# Patient Record
Sex: Male | Born: 1940 | Race: White | Hispanic: No | Marital: Married | Smoking: Former smoker
Health system: Southern US, Community
[De-identification: ages and names within clinical notes are randomized; demographics above are authoritative.]

## PROBLEM LIST (undated history)

## (undated) DIAGNOSIS — I10 Essential (primary) hypertension: Secondary | ICD-10-CM

## (undated) DIAGNOSIS — D485 Neoplasm of uncertain behavior of skin: Secondary | ICD-10-CM

## (undated) DIAGNOSIS — C4490 Unspecified malignant neoplasm of skin, unspecified: Secondary | ICD-10-CM

## (undated) DIAGNOSIS — N400 Enlarged prostate without lower urinary tract symptoms: Secondary | ICD-10-CM

## (undated) DIAGNOSIS — I493 Ventricular premature depolarization: Secondary | ICD-10-CM

## (undated) DIAGNOSIS — G479 Sleep disorder, unspecified: Secondary | ICD-10-CM

## (undated) DIAGNOSIS — I4891 Unspecified atrial fibrillation: Secondary | ICD-10-CM

## (undated) DIAGNOSIS — H900 Conductive hearing loss, bilateral: Secondary | ICD-10-CM

## (undated) DIAGNOSIS — E119 Type 2 diabetes mellitus without complications: Secondary | ICD-10-CM

## (undated) DIAGNOSIS — I441 Atrioventricular block, second degree: Secondary | ICD-10-CM

## (undated) DIAGNOSIS — E785 Hyperlipidemia, unspecified: Secondary | ICD-10-CM

## (undated) HISTORY — PX: SKIN CANCER EXCISION: SHX779

## (undated) HISTORY — DX: Unspecified atrial fibrillation: I48.91

## (undated) HISTORY — DX: Sleep disorder, unspecified: G47.9

## (undated) HISTORY — PX: OTHER SURGICAL HISTORY: SHX169

## (undated) HISTORY — DX: Benign prostatic hyperplasia without lower urinary tract symptoms: N40.0

## (undated) HISTORY — DX: Atrioventricular block, second degree: I44.1

## (undated) HISTORY — DX: Hyperlipidemia, unspecified: E78.5

## (undated) HISTORY — DX: Ventricular premature depolarization: I49.3

## (undated) HISTORY — DX: Type 2 diabetes mellitus without complications: E11.9

## (undated) HISTORY — PX: PACEMAKER PLACEMENT: SHX43

## (undated) HISTORY — DX: Unspecified malignant neoplasm of skin, unspecified: C44.90

## (undated) HISTORY — DX: Neoplasm of uncertain behavior of skin: D48.5

## (undated) HISTORY — PX: HERNIA REPAIR: SHX51

## (undated) HISTORY — PX: HAND SURGERY: SHX662

## (undated) HISTORY — DX: Essential (primary) hypertension: I10

## (undated) HISTORY — DX: Conductive hearing loss, bilateral: H90.0

---

## 2003-08-07 ENCOUNTER — Encounter: Admission: RE | Admit: 2003-08-07 | Discharge: 2003-11-05 | Payer: Self-pay

## 2003-11-26 ENCOUNTER — Encounter: Admission: RE | Admit: 2003-11-26 | Discharge: 2004-02-24 | Payer: Self-pay

## 2004-03-18 ENCOUNTER — Ambulatory Visit: Payer: Self-pay | Admitting: Internal Medicine

## 2004-03-25 ENCOUNTER — Ambulatory Visit: Payer: Self-pay | Admitting: Internal Medicine

## 2005-09-25 ENCOUNTER — Ambulatory Visit: Payer: Self-pay | Admitting: Internal Medicine

## 2005-09-30 ENCOUNTER — Ambulatory Visit: Payer: Self-pay | Admitting: Internal Medicine

## 2005-10-07 ENCOUNTER — Ambulatory Visit: Payer: Self-pay | Admitting: Internal Medicine

## 2005-10-09 ENCOUNTER — Ambulatory Visit: Payer: Self-pay | Admitting: Family Medicine

## 2005-10-12 ENCOUNTER — Ambulatory Visit: Payer: Self-pay | Admitting: Internal Medicine

## 2005-10-15 ENCOUNTER — Ambulatory Visit: Payer: Self-pay | Admitting: Internal Medicine

## 2005-10-16 ENCOUNTER — Ambulatory Visit: Payer: Self-pay | Admitting: Cardiology

## 2005-10-29 ENCOUNTER — Encounter: Payer: Self-pay | Admitting: Cardiology

## 2005-10-29 ENCOUNTER — Ambulatory Visit: Payer: Self-pay

## 2005-11-05 ENCOUNTER — Ambulatory Visit: Payer: Self-pay | Admitting: Internal Medicine

## 2005-12-03 ENCOUNTER — Ambulatory Visit: Payer: Self-pay | Admitting: Internal Medicine

## 2005-12-31 ENCOUNTER — Ambulatory Visit: Payer: Self-pay | Admitting: Internal Medicine

## 2006-01-01 ENCOUNTER — Ambulatory Visit: Payer: Self-pay | Admitting: Internal Medicine

## 2006-01-05 ENCOUNTER — Ambulatory Visit: Payer: Self-pay | Admitting: Internal Medicine

## 2006-01-28 ENCOUNTER — Ambulatory Visit: Payer: Self-pay | Admitting: Internal Medicine

## 2006-02-11 ENCOUNTER — Ambulatory Visit: Payer: Self-pay | Admitting: Cardiology

## 2006-02-25 ENCOUNTER — Ambulatory Visit: Payer: Self-pay | Admitting: Internal Medicine

## 2006-03-11 ENCOUNTER — Ambulatory Visit: Payer: Self-pay | Admitting: Internal Medicine

## 2006-03-15 ENCOUNTER — Ambulatory Visit: Payer: Self-pay | Admitting: Internal Medicine

## 2006-03-29 ENCOUNTER — Ambulatory Visit: Payer: Self-pay | Admitting: Internal Medicine

## 2006-04-29 ENCOUNTER — Ambulatory Visit: Payer: Self-pay | Admitting: Internal Medicine

## 2006-05-03 ENCOUNTER — Ambulatory Visit: Payer: Self-pay | Admitting: Internal Medicine

## 2006-05-10 ENCOUNTER — Ambulatory Visit: Payer: Self-pay | Admitting: Internal Medicine

## 2006-05-17 DIAGNOSIS — G479 Sleep disorder, unspecified: Secondary | ICD-10-CM | POA: Insufficient documentation

## 2006-05-17 DIAGNOSIS — Z85828 Personal history of other malignant neoplasm of skin: Secondary | ICD-10-CM

## 2006-05-27 ENCOUNTER — Ambulatory Visit: Payer: Self-pay | Admitting: Internal Medicine

## 2006-06-24 ENCOUNTER — Ambulatory Visit: Payer: Self-pay | Admitting: Internal Medicine

## 2006-06-28 ENCOUNTER — Ambulatory Visit: Payer: Self-pay | Admitting: Internal Medicine

## 2006-07-05 ENCOUNTER — Ambulatory Visit: Payer: Self-pay | Admitting: Internal Medicine

## 2006-07-19 ENCOUNTER — Ambulatory Visit: Payer: Self-pay | Admitting: Internal Medicine

## 2006-07-29 ENCOUNTER — Ambulatory Visit: Payer: Self-pay | Admitting: Internal Medicine

## 2006-08-12 ENCOUNTER — Ambulatory Visit: Payer: Self-pay | Admitting: Internal Medicine

## 2006-09-09 ENCOUNTER — Ambulatory Visit: Payer: Self-pay | Admitting: Internal Medicine

## 2006-09-16 ENCOUNTER — Ambulatory Visit: Payer: Self-pay | Admitting: Internal Medicine

## 2006-09-16 ENCOUNTER — Ambulatory Visit: Payer: Self-pay | Admitting: Cardiology

## 2006-10-14 ENCOUNTER — Ambulatory Visit: Payer: Self-pay | Admitting: Internal Medicine

## 2006-10-14 LAB — CONVERTED CEMR LAB: INR: 2.8

## 2006-11-11 ENCOUNTER — Ambulatory Visit: Payer: Self-pay | Admitting: Internal Medicine

## 2006-11-11 LAB — CONVERTED CEMR LAB
INR: 1.8
Prothrombin Time: 16.7 s

## 2006-11-25 ENCOUNTER — Ambulatory Visit: Payer: Self-pay | Admitting: Internal Medicine

## 2006-11-25 LAB — CONVERTED CEMR LAB: INR: 2.2

## 2006-12-23 ENCOUNTER — Ambulatory Visit: Payer: Self-pay | Admitting: Internal Medicine

## 2006-12-23 LAB — CONVERTED CEMR LAB
INR: 2.8
Prothrombin Time: 20.4 s

## 2007-01-20 ENCOUNTER — Ambulatory Visit: Payer: Self-pay | Admitting: Internal Medicine

## 2007-01-20 LAB — CONVERTED CEMR LAB
INR: 2.3
Prothrombin Time: 18.4 s

## 2007-02-17 ENCOUNTER — Ambulatory Visit: Payer: Self-pay | Admitting: Internal Medicine

## 2007-02-17 LAB — CONVERTED CEMR LAB
INR: 2.9
Prothrombin Time: 20.6 s

## 2007-03-17 ENCOUNTER — Ambulatory Visit: Payer: Self-pay | Admitting: Internal Medicine

## 2007-03-17 DIAGNOSIS — E119 Type 2 diabetes mellitus without complications: Secondary | ICD-10-CM | POA: Insufficient documentation

## 2007-03-17 DIAGNOSIS — N1831 Chronic kidney disease, stage 3a: Secondary | ICD-10-CM | POA: Insufficient documentation

## 2007-03-17 DIAGNOSIS — E1365 Other specified diabetes mellitus with hyperglycemia: Secondary | ICD-10-CM

## 2007-03-17 DIAGNOSIS — E1169 Type 2 diabetes mellitus with other specified complication: Secondary | ICD-10-CM | POA: Insufficient documentation

## 2007-03-17 DIAGNOSIS — E1351 Other specified diabetes mellitus with diabetic peripheral angiopathy without gangrene: Secondary | ICD-10-CM

## 2007-03-17 DIAGNOSIS — E785 Hyperlipidemia, unspecified: Secondary | ICD-10-CM | POA: Insufficient documentation

## 2007-03-17 DIAGNOSIS — N400 Enlarged prostate without lower urinary tract symptoms: Secondary | ICD-10-CM | POA: Insufficient documentation

## 2007-03-17 LAB — CONVERTED CEMR LAB: Prothrombin Time: 20.6 s

## 2007-03-21 ENCOUNTER — Encounter (INDEPENDENT_AMBULATORY_CARE_PROVIDER_SITE_OTHER): Payer: Self-pay | Admitting: *Deleted

## 2007-03-22 LAB — CONVERTED CEMR LAB
ALT: 16 units/L (ref 0–53)
Alkaline Phosphatase: 47 units/L (ref 39–117)
Cholesterol: 194 mg/dL (ref 0–200)
Creatinine, Ser: 1.7 mg/dL — ABNORMAL HIGH (ref 0.4–1.5)
Potassium: 5.7 meq/L — ABNORMAL HIGH (ref 3.5–5.1)
Total Bilirubin: 0.9 mg/dL (ref 0.3–1.2)
Total CHOL/HDL Ratio: 4.4
Total Protein: 6.9 g/dL (ref 6.0–8.3)

## 2007-03-29 ENCOUNTER — Encounter (INDEPENDENT_AMBULATORY_CARE_PROVIDER_SITE_OTHER): Payer: Self-pay | Admitting: *Deleted

## 2007-03-29 ENCOUNTER — Ambulatory Visit: Payer: Self-pay | Admitting: Internal Medicine

## 2007-03-30 ENCOUNTER — Telehealth: Payer: Self-pay | Admitting: Internal Medicine

## 2007-03-31 ENCOUNTER — Encounter: Payer: Self-pay | Admitting: Internal Medicine

## 2007-04-12 ENCOUNTER — Encounter: Payer: Self-pay | Admitting: Internal Medicine

## 2007-04-14 ENCOUNTER — Ambulatory Visit: Payer: Self-pay | Admitting: Internal Medicine

## 2007-04-14 DIAGNOSIS — H902 Conductive hearing loss, unspecified: Secondary | ICD-10-CM | POA: Insufficient documentation

## 2007-04-14 LAB — CONVERTED CEMR LAB
INR: 2.4
Prothrombin Time: 19 s

## 2007-05-12 ENCOUNTER — Ambulatory Visit: Payer: Self-pay | Admitting: Internal Medicine

## 2007-05-31 ENCOUNTER — Ambulatory Visit: Payer: Self-pay | Admitting: Internal Medicine

## 2007-05-31 LAB — CONVERTED CEMR LAB
AST: 23 units/L (ref 0–37)
BUN: 24 mg/dL — ABNORMAL HIGH (ref 6–23)
LDL Cholesterol: 88 mg/dL (ref 0–99)
VLDL: 26 mg/dL (ref 0–40)

## 2007-06-07 ENCOUNTER — Ambulatory Visit: Payer: Self-pay | Admitting: Internal Medicine

## 2007-06-07 LAB — CONVERTED CEMR LAB: HDL goal, serum: 40 mg/dL

## 2007-06-16 ENCOUNTER — Encounter (INDEPENDENT_AMBULATORY_CARE_PROVIDER_SITE_OTHER): Payer: Self-pay | Admitting: *Deleted

## 2007-06-16 LAB — CONVERTED CEMR LAB
Creatinine,U: 129.5 mg/dL
Hgb A1c MFr Bld: 6.3 % — ABNORMAL HIGH (ref 4.6–6.0)
Microalb Creat Ratio: 10 mg/g (ref 0.0–30.0)

## 2007-07-07 ENCOUNTER — Ambulatory Visit: Payer: Self-pay | Admitting: Internal Medicine

## 2007-08-02 ENCOUNTER — Ambulatory Visit: Payer: Self-pay | Admitting: Internal Medicine

## 2007-08-04 ENCOUNTER — Ambulatory Visit: Payer: Self-pay | Admitting: Internal Medicine

## 2007-08-04 LAB — CONVERTED CEMR LAB: INR: 2.2

## 2007-08-08 ENCOUNTER — Telehealth (INDEPENDENT_AMBULATORY_CARE_PROVIDER_SITE_OTHER): Payer: Self-pay | Admitting: *Deleted

## 2007-08-31 ENCOUNTER — Telehealth (INDEPENDENT_AMBULATORY_CARE_PROVIDER_SITE_OTHER): Payer: Self-pay | Admitting: *Deleted

## 2007-09-01 ENCOUNTER — Ambulatory Visit: Payer: Self-pay | Admitting: Internal Medicine

## 2007-09-14 ENCOUNTER — Ambulatory Visit: Payer: Self-pay | Admitting: Internal Medicine

## 2007-09-14 LAB — CONVERTED CEMR LAB
INR: 5.3
Prothrombin Time: 27.9 s

## 2007-09-22 ENCOUNTER — Ambulatory Visit: Payer: Self-pay | Admitting: Internal Medicine

## 2007-09-22 LAB — CONVERTED CEMR LAB: INR: 2

## 2007-10-07 ENCOUNTER — Ambulatory Visit: Payer: Self-pay | Admitting: Cardiology

## 2007-10-13 ENCOUNTER — Ambulatory Visit: Payer: Self-pay | Admitting: Internal Medicine

## 2007-10-13 LAB — CONVERTED CEMR LAB

## 2007-10-27 ENCOUNTER — Ambulatory Visit: Payer: Self-pay | Admitting: Internal Medicine

## 2007-11-17 ENCOUNTER — Ambulatory Visit: Payer: Self-pay | Admitting: Internal Medicine

## 2007-11-17 DIAGNOSIS — I4891 Unspecified atrial fibrillation: Secondary | ICD-10-CM | POA: Insufficient documentation

## 2007-11-17 LAB — CONVERTED CEMR LAB: INR: 4.1

## 2007-11-20 ENCOUNTER — Encounter: Payer: Self-pay | Admitting: Internal Medicine

## 2007-11-20 DIAGNOSIS — R930 Abnormal findings on diagnostic imaging of skull and head, not elsewhere classified: Secondary | ICD-10-CM | POA: Insufficient documentation

## 2007-11-28 ENCOUNTER — Ambulatory Visit: Payer: Self-pay

## 2007-11-29 ENCOUNTER — Encounter: Payer: Self-pay | Admitting: Internal Medicine

## 2007-12-15 ENCOUNTER — Ambulatory Visit: Payer: Self-pay | Admitting: Internal Medicine

## 2007-12-22 ENCOUNTER — Ambulatory Visit: Payer: Self-pay | Admitting: Internal Medicine

## 2007-12-24 LAB — CONVERTED CEMR LAB
Cholesterol: 178 mg/dL (ref 0–200)
HDL: 51.4 mg/dL (ref >39.0)
Total CHOL/HDL Ratio: 3.5
Triglycerides: 187 mg/dL — ABNORMAL HIGH (ref 0–149)

## 2007-12-27 ENCOUNTER — Encounter (INDEPENDENT_AMBULATORY_CARE_PROVIDER_SITE_OTHER): Payer: Self-pay | Admitting: *Deleted

## 2007-12-29 ENCOUNTER — Ambulatory Visit: Payer: Self-pay | Admitting: Internal Medicine

## 2007-12-29 DIAGNOSIS — D485 Neoplasm of uncertain behavior of skin: Secondary | ICD-10-CM | POA: Insufficient documentation

## 2008-01-04 ENCOUNTER — Ambulatory Visit: Payer: Self-pay | Admitting: Cardiology

## 2008-01-16 ENCOUNTER — Ambulatory Visit: Payer: Self-pay | Admitting: Internal Medicine

## 2008-01-16 DIAGNOSIS — I495 Sick sinus syndrome: Secondary | ICD-10-CM | POA: Insufficient documentation

## 2008-01-16 DIAGNOSIS — E1159 Type 2 diabetes mellitus with other circulatory complications: Secondary | ICD-10-CM | POA: Insufficient documentation

## 2008-01-16 DIAGNOSIS — I10 Essential (primary) hypertension: Secondary | ICD-10-CM | POA: Insufficient documentation

## 2008-01-16 LAB — CONVERTED CEMR LAB: Free T4: 1.1 ng/dL (ref 0.6–1.6)

## 2008-01-17 ENCOUNTER — Encounter (INDEPENDENT_AMBULATORY_CARE_PROVIDER_SITE_OTHER): Payer: Self-pay | Admitting: *Deleted

## 2008-02-02 ENCOUNTER — Ambulatory Visit: Payer: Self-pay | Admitting: Internal Medicine

## 2008-02-16 ENCOUNTER — Ambulatory Visit: Payer: Self-pay | Admitting: Internal Medicine

## 2008-02-16 LAB — CONVERTED CEMR LAB: INR: 2.1

## 2008-03-14 ENCOUNTER — Encounter: Payer: Self-pay | Admitting: Internal Medicine

## 2008-03-15 ENCOUNTER — Ambulatory Visit: Payer: Self-pay | Admitting: Internal Medicine

## 2008-03-15 LAB — CONVERTED CEMR LAB: INR: 1.6

## 2008-03-30 ENCOUNTER — Telehealth (INDEPENDENT_AMBULATORY_CARE_PROVIDER_SITE_OTHER): Payer: Self-pay | Admitting: *Deleted

## 2008-04-12 ENCOUNTER — Ambulatory Visit: Payer: Self-pay | Admitting: Internal Medicine

## 2008-04-12 LAB — CONVERTED CEMR LAB: INR: 1.7

## 2008-05-03 ENCOUNTER — Ambulatory Visit: Payer: Self-pay | Admitting: Internal Medicine

## 2008-05-03 ENCOUNTER — Telehealth (INDEPENDENT_AMBULATORY_CARE_PROVIDER_SITE_OTHER): Payer: Self-pay | Admitting: *Deleted

## 2008-05-07 ENCOUNTER — Ambulatory Visit: Payer: Self-pay | Admitting: Internal Medicine

## 2008-05-14 ENCOUNTER — Ambulatory Visit: Payer: Self-pay | Admitting: Internal Medicine

## 2008-05-16 ENCOUNTER — Ambulatory Visit: Payer: Self-pay | Admitting: Internal Medicine

## 2008-05-16 LAB — CONVERTED CEMR LAB: INR: 2.1

## 2008-05-23 ENCOUNTER — Ambulatory Visit: Payer: Self-pay | Admitting: Internal Medicine

## 2008-05-29 ENCOUNTER — Ambulatory Visit: Payer: Self-pay | Admitting: Internal Medicine

## 2008-05-29 LAB — CONVERTED CEMR LAB
BUN: 22 mg/dL (ref 6–23)
Basophils Absolute: 0 10*3/uL (ref 0.0–0.1)
Basophils Relative: 0.3 % (ref 0.0–3.0)
Calcium: 9.6 mg/dL (ref 8.4–10.5)
Creatinine, Ser: 1.5 mg/dL (ref 0.4–1.5)
Eosinophils Absolute: 0.2 10*3/uL (ref 0.0–0.7)
Eosinophils Relative: 4.1 % (ref 0.0–5.0)
GFR calc non Af Amer: 50 mL/min
HCT: 39.8 % (ref 39.0–52.0)
Hemoglobin: 13.5 g/dL (ref 13.0–17.0)
MCHC: 34.1 g/dL (ref 30.0–36.0)
MCV: 87.7 fL (ref 78.0–100.0)
Monocytes Absolute: 0.5 10*3/uL (ref 0.1–1.0)
Neutro Abs: 2.7 10*3/uL (ref 1.4–7.7)
Neutrophils Relative %: 55.2 % (ref 43.0–77.0)
RBC: 4.53 M/uL (ref 4.22–5.81)
aPTT: 34.8 s — ABNORMAL HIGH (ref 21.7–29.8)

## 2008-06-08 ENCOUNTER — Ambulatory Visit: Payer: Self-pay | Admitting: Internal Medicine

## 2008-06-11 ENCOUNTER — Ambulatory Visit: Payer: Self-pay | Admitting: Internal Medicine

## 2008-06-12 ENCOUNTER — Ambulatory Visit (HOSPITAL_COMMUNITY): Admission: RE | Admit: 2008-06-12 | Discharge: 2008-06-13 | Payer: Self-pay | Admitting: Internal Medicine

## 2008-06-12 ENCOUNTER — Encounter: Payer: Self-pay | Admitting: Cardiology

## 2008-06-13 ENCOUNTER — Encounter: Payer: Self-pay | Admitting: Internal Medicine

## 2008-06-18 ENCOUNTER — Ambulatory Visit: Payer: Self-pay | Admitting: Internal Medicine

## 2008-06-25 LAB — CONVERTED CEMR LAB
Basophils Absolute: 0 10*3/uL (ref 0.0–0.1)
Basophils Relative: 0.3 % (ref 0.0–3.0)
CO2: 28 meq/L (ref 19–32)
Calcium: 9.6 mg/dL (ref 8.4–10.5)
Chloride: 101 meq/L (ref 96–112)
Glucose, Bld: 105 mg/dL — ABNORMAL HIGH (ref 70–99)
Hemoglobin: 13.8 g/dL (ref 13.0–17.0)
Hgb A1c MFr Bld: 6 % (ref 4.6–6.0)
INR: 2.6 — ABNORMAL HIGH (ref 0.8–1.0)
Lymphocytes Relative: 28.6 % (ref 12.0–46.0)
MCHC: 35 g/dL (ref 30.0–36.0)
Monocytes Relative: 8.5 % (ref 3.0–12.0)
Neutro Abs: 2.6 10*3/uL (ref 1.4–7.7)
Neutrophils Relative %: 58.8 % (ref 43.0–77.0)
Prothrombin Time: 26.7 s — ABNORMAL HIGH (ref 10.9–13.3)
RBC: 4.58 M/uL (ref 4.22–5.81)
RDW: 14.5 % (ref 11.5–14.6)
Sodium: 139 meq/L (ref 135–145)
Total CHOL/HDL Ratio: 3.3
aPTT: 38.3 s — ABNORMAL HIGH (ref 21.7–29.8)

## 2008-06-26 ENCOUNTER — Encounter (INDEPENDENT_AMBULATORY_CARE_PROVIDER_SITE_OTHER): Payer: Self-pay | Admitting: *Deleted

## 2008-06-28 ENCOUNTER — Ambulatory Visit: Payer: Self-pay | Admitting: Internal Medicine

## 2008-06-28 LAB — CONVERTED CEMR LAB: INR: 2.6

## 2008-07-12 ENCOUNTER — Ambulatory Visit: Payer: Self-pay

## 2008-07-12 ENCOUNTER — Telehealth (INDEPENDENT_AMBULATORY_CARE_PROVIDER_SITE_OTHER): Payer: Self-pay | Admitting: *Deleted

## 2008-07-18 DIAGNOSIS — Z9889 Other specified postprocedural states: Secondary | ICD-10-CM | POA: Insufficient documentation

## 2008-07-18 DIAGNOSIS — I441 Atrioventricular block, second degree: Secondary | ICD-10-CM | POA: Insufficient documentation

## 2008-07-19 ENCOUNTER — Encounter: Payer: Self-pay | Admitting: Internal Medicine

## 2008-07-19 ENCOUNTER — Ambulatory Visit: Payer: Self-pay | Admitting: Internal Medicine

## 2008-07-20 ENCOUNTER — Ambulatory Visit: Payer: Self-pay | Admitting: Internal Medicine

## 2008-07-20 DIAGNOSIS — IMO0002 Reserved for concepts with insufficient information to code with codable children: Secondary | ICD-10-CM

## 2008-07-20 LAB — CONVERTED CEMR LAB: INR: 1.9

## 2008-08-07 ENCOUNTER — Telehealth (INDEPENDENT_AMBULATORY_CARE_PROVIDER_SITE_OTHER): Payer: Self-pay | Admitting: *Deleted

## 2008-08-21 ENCOUNTER — Ambulatory Visit: Payer: Self-pay | Admitting: Internal Medicine

## 2008-08-21 LAB — CONVERTED CEMR LAB: INR: 2.1

## 2008-09-14 ENCOUNTER — Ambulatory Visit: Payer: Self-pay | Admitting: Internal Medicine

## 2008-09-14 DIAGNOSIS — I10 Essential (primary) hypertension: Secondary | ICD-10-CM | POA: Insufficient documentation

## 2008-09-18 ENCOUNTER — Ambulatory Visit: Payer: Self-pay | Admitting: Internal Medicine

## 2008-10-15 ENCOUNTER — Ambulatory Visit: Payer: Self-pay | Admitting: Internal Medicine

## 2008-10-30 ENCOUNTER — Ambulatory Visit: Payer: Self-pay | Admitting: Internal Medicine

## 2008-10-30 LAB — CONVERTED CEMR LAB
BUN: 15 mg/dL (ref 6–23)
Cholesterol: 188 mg/dL (ref 0–200)
Creatinine, Ser: 1.3 mg/dL (ref 0.4–1.5)
HDL: 45.7 mg/dL (ref >39.00)
Triglycerides: 217 mg/dL — ABNORMAL HIGH (ref 0.0–149.0)

## 2008-11-06 ENCOUNTER — Ambulatory Visit: Payer: Self-pay | Admitting: Internal Medicine

## 2008-11-08 ENCOUNTER — Telehealth: Payer: Self-pay | Admitting: Internal Medicine

## 2008-12-04 ENCOUNTER — Ambulatory Visit: Payer: Self-pay | Admitting: Internal Medicine

## 2008-12-04 LAB — CONVERTED CEMR LAB: INR: 2.4

## 2008-12-06 ENCOUNTER — Telehealth (INDEPENDENT_AMBULATORY_CARE_PROVIDER_SITE_OTHER): Payer: Self-pay | Admitting: *Deleted

## 2008-12-14 ENCOUNTER — Ambulatory Visit: Payer: Self-pay | Admitting: Internal Medicine

## 2009-01-01 ENCOUNTER — Ambulatory Visit: Payer: Self-pay | Admitting: Internal Medicine

## 2009-01-01 LAB — CONVERTED CEMR LAB: INR: 2.4

## 2009-01-28 ENCOUNTER — Ambulatory Visit: Payer: Self-pay | Admitting: Internal Medicine

## 2009-02-11 ENCOUNTER — Ambulatory Visit: Payer: Self-pay | Admitting: Internal Medicine

## 2009-02-11 LAB — CONVERTED CEMR LAB
Cholesterol: 173 mg/dL (ref 0–200)
Triglycerides: 227 mg/dL — ABNORMAL HIGH (ref 0.0–149.0)

## 2009-02-18 ENCOUNTER — Ambulatory Visit: Payer: Self-pay | Admitting: Internal Medicine

## 2009-03-18 ENCOUNTER — Ambulatory Visit: Payer: Self-pay | Admitting: Internal Medicine

## 2009-04-01 ENCOUNTER — Ambulatory Visit: Payer: Self-pay | Admitting: Internal Medicine

## 2009-04-01 LAB — CONVERTED CEMR LAB: INR: 2.5

## 2009-04-29 ENCOUNTER — Ambulatory Visit: Payer: Self-pay | Admitting: Internal Medicine

## 2009-04-29 LAB — CONVERTED CEMR LAB: INR: 2.6

## 2009-05-27 ENCOUNTER — Ambulatory Visit: Payer: Self-pay | Admitting: Internal Medicine

## 2009-05-27 DIAGNOSIS — K219 Gastro-esophageal reflux disease without esophagitis: Secondary | ICD-10-CM | POA: Insufficient documentation

## 2009-05-28 ENCOUNTER — Telehealth (INDEPENDENT_AMBULATORY_CARE_PROVIDER_SITE_OTHER): Payer: Self-pay | Admitting: *Deleted

## 2009-05-28 LAB — CONVERTED CEMR LAB
AST: 32 units/L (ref 0–37)
Albumin: 4.2 g/dL (ref 3.5–5.2)
Alkaline Phosphatase: 42 units/L (ref 39–117)
BUN: 15 mg/dL (ref 6–23)
Basophils Absolute: 0 10*3/uL (ref 0.0–0.1)
Basophils Relative: 0.7 % (ref 0.0–3.0)
Creatinine,U: 213.8 mg/dL
Direct LDL: 92.8 mg/dL
Eosinophils Relative: 3.8 % (ref 0.0–5.0)
Hemoglobin: 14.3 g/dL (ref 13.0–17.0)
INR: 3.6 — ABNORMAL HIGH (ref 0.8–1.0)
Lymphocytes Relative: 26.4 % (ref 12.0–46.0)
Monocytes Relative: 9.3 % (ref 3.0–12.0)
Neutro Abs: 2.8 10*3/uL (ref 1.4–7.7)
Potassium: 5.2 meq/L — ABNORMAL HIGH (ref 3.5–5.1)
RBC: 4.85 M/uL (ref 4.22–5.81)
Total Protein: 6.7 g/dL (ref 6.0–8.3)
VLDL: 60 mg/dL — ABNORMAL HIGH (ref 0.0–40.0)
WBC: 4.6 10*3/uL (ref 4.5–10.5)

## 2009-06-24 ENCOUNTER — Ambulatory Visit: Payer: Self-pay | Admitting: Internal Medicine

## 2009-06-28 ENCOUNTER — Ambulatory Visit: Payer: Self-pay | Admitting: Internal Medicine

## 2009-07-23 ENCOUNTER — Ambulatory Visit: Payer: Self-pay | Admitting: Internal Medicine

## 2009-07-23 LAB — CONVERTED CEMR LAB
INR: 1.8
POC INR: 1.8

## 2009-07-24 ENCOUNTER — Telehealth (INDEPENDENT_AMBULATORY_CARE_PROVIDER_SITE_OTHER): Payer: Self-pay | Admitting: *Deleted

## 2009-08-20 ENCOUNTER — Telehealth (INDEPENDENT_AMBULATORY_CARE_PROVIDER_SITE_OTHER): Payer: Self-pay | Admitting: *Deleted

## 2009-08-20 ENCOUNTER — Ambulatory Visit: Payer: Self-pay | Admitting: Internal Medicine

## 2009-09-09 ENCOUNTER — Ambulatory Visit: Payer: Self-pay | Admitting: Internal Medicine

## 2009-09-16 ENCOUNTER — Ambulatory Visit: Payer: Self-pay | Admitting: Internal Medicine

## 2009-09-16 LAB — CONVERTED CEMR LAB: Creatinine, Ser: 1.2 mg/dL (ref 0.4–1.5)

## 2009-10-14 ENCOUNTER — Ambulatory Visit: Payer: Self-pay | Admitting: Internal Medicine

## 2009-11-11 ENCOUNTER — Ambulatory Visit: Payer: Self-pay | Admitting: Internal Medicine

## 2009-11-11 LAB — CONVERTED CEMR LAB: POC INR: 2.4

## 2009-11-26 ENCOUNTER — Ambulatory Visit: Payer: Self-pay | Admitting: Internal Medicine

## 2009-11-26 DIAGNOSIS — J069 Acute upper respiratory infection, unspecified: Secondary | ICD-10-CM | POA: Insufficient documentation

## 2009-11-26 LAB — CONVERTED CEMR LAB: Rapid Strep: NEGATIVE

## 2009-12-02 ENCOUNTER — Ambulatory Visit: Payer: Self-pay | Admitting: Internal Medicine

## 2009-12-02 LAB — CONVERTED CEMR LAB: POC INR: 2.6

## 2010-01-06 ENCOUNTER — Ambulatory Visit: Payer: Self-pay | Admitting: Internal Medicine

## 2010-01-06 LAB — CONVERTED CEMR LAB: POC INR: 3.1

## 2010-01-08 ENCOUNTER — Ambulatory Visit: Payer: Self-pay | Admitting: Internal Medicine

## 2010-01-13 ENCOUNTER — Ambulatory Visit: Payer: Self-pay | Admitting: Internal Medicine

## 2010-01-13 DIAGNOSIS — F329 Major depressive disorder, single episode, unspecified: Secondary | ICD-10-CM

## 2010-01-14 ENCOUNTER — Encounter: Payer: Self-pay | Admitting: Internal Medicine

## 2010-02-03 ENCOUNTER — Ambulatory Visit: Payer: Self-pay | Admitting: Internal Medicine

## 2010-02-24 ENCOUNTER — Ambulatory Visit: Payer: Self-pay | Admitting: Internal Medicine

## 2010-02-27 ENCOUNTER — Ambulatory Visit: Payer: Self-pay | Admitting: Internal Medicine

## 2010-04-07 ENCOUNTER — Ambulatory Visit: Payer: Self-pay | Admitting: Internal Medicine

## 2010-04-07 LAB — CONVERTED CEMR LAB: INR: 1.8

## 2010-04-14 ENCOUNTER — Ambulatory Visit: Payer: Self-pay | Admitting: Internal Medicine

## 2010-04-14 LAB — CONVERTED CEMR LAB
Creatinine, Ser: 1.4 mg/dL (ref 0.4–1.5)
Microalb Creat Ratio: 2.6 mg/g (ref 0.0–30.0)

## 2010-05-05 ENCOUNTER — Ambulatory Visit
Admission: RE | Admit: 2010-05-05 | Discharge: 2010-05-05 | Payer: Self-pay | Source: Home / Self Care | Attending: Internal Medicine | Admitting: Internal Medicine

## 2010-05-05 LAB — CONVERTED CEMR LAB: INR: 2.1

## 2010-05-10 DIAGNOSIS — Z7901 Long term (current) use of anticoagulants: Secondary | ICD-10-CM

## 2010-05-10 DIAGNOSIS — I4891 Unspecified atrial fibrillation: Secondary | ICD-10-CM

## 2010-05-21 ENCOUNTER — Ambulatory Visit: Payer: Self-pay | Admitting: Internal Medicine

## 2010-05-21 ENCOUNTER — Other Ambulatory Visit: Payer: Self-pay | Admitting: *Deleted

## 2010-05-21 DIAGNOSIS — Z5181 Encounter for therapeutic drug level monitoring: Secondary | ICD-10-CM

## 2010-05-21 DIAGNOSIS — I4891 Unspecified atrial fibrillation: Secondary | ICD-10-CM

## 2010-05-21 DIAGNOSIS — Z7901 Long term (current) use of anticoagulants: Secondary | ICD-10-CM

## 2010-05-27 NOTE — Assessment & Plan Note (Signed)
Summary: 4 mth fu/ns/kdc   Vital Signs:  Patient profile:   70 year old male Weight:      225 pounds Pulse rate:   60 / minute Resp:     16 per minute BP sitting:   110 / 60  (left arm)  Vitals Entered By: Doristine Devoid (June 24, 2009 9:06 AM) CC: 4 month roa    Primary Care Provider:  Marga Melnick MD  CC:  4 month roa .  History of Present Illness: Labs reviewed & risks discussed; he is on no diet & is not exercising Pathophysiology of insulin resistance discussed.Marland KitchenHis L great toenail got black in 01/2009. Now nail is repairing itself , but he trimmed nail too closely. PT/INR is 1.9 on Coumadin 1mg  once daily except 2 mg M&F.  Allergies: 1)  ! * Altace 2)  ! Amoxicillin 3)  ! Tramadol Hcl (Tramadol Hcl)  Physical Exam  General:  in no acute distress; alert,appropriate and cooperative throughout examination Pulses:  R and L dorsalis pedis and posterior tibial pulses are full and equal bilaterally Extremities:  Double nail formation R great nail w/o signs of infection Neurologic:  alert & oriented X3 and sensation intact to light touch decreased R sole.   Skin:  Intact without suspicious lesions or rashes Psych:  Questioning role of meds & /or stress in Lipid abnormalities   Impression & Recommendations:  Problem # 1:  DIABETES MELLITUS, TYPE II, CONTROLLED (ICD-250.00) Control deteriorating His updated medication list for this problem includes:    Diovan 160 Mg Tabs (Valsartan) .Marland Kitchen... 1daily    Metformin Hcl 500 Mg Tabs (Metformin hcl) .Marland Kitchen... 1 in am with b'fast & 2 with eve meal  Problem # 2:  ENCOUNTER FOR LONG-TERM USE OF ANTICOAGULANTS (ICD-V58.61)  Orders: Protime (14782NF)  Complete Medication List: 1)  Fenofibrate Micronized 134 Mg Caps (Fenofibrate micronized) .Marland Kitchen.. 1 by mouth once daily 2)  Diovan 160 Mg Tabs (Valsartan) .Marland Kitchen.. 1daily 3)  Metformin Hcl 500 Mg Tabs (Metformin hcl) .Marland Kitchen.. 1 in am with b'fast & 2 with eve meal 4)  Warfarin Sodium 2 Mg Tabs  (Warfarin sodium) .... Take as directed 5)  Furosemide 40 Mg Tabs (Furosemide) .... Take 1 tablet by mouth as needed 6)  Zyrtec Allergy 10 Mg Tabs (Cetirizine hcl) .Marland Kitchen.. 1 by mouth once daily prn 7)  Pravastatin Sodium 20 Mg Tabs (Pravastatin sodium) .Marland Kitchen.. 1 qhs 8)  Cialis 20 Mg Tabs (Tadalafil) .... Take as directed 9)  Mvi: Your Life 40+  .Marland Kitchen.. 1 by mouth once daily 10)  Ranitidine Hcl 150 Mg Tabs (Ranitidine hcl) .Marland Kitchen.. 1 two times a day pre meals 11)  Tramadol Hcl 50 Mg Tabs (Tramadol hcl) .Marland Kitchen.. 1 q 6 hrs as needed joint pain  Patient Instructions: 1)  Consume LESS THAN 40 grams of sugar /day from foods & drinks with High Fructose Corn Syrup as #1,2 or #3 on label. 2)  Please schedule a follow-up appointment in 3 months. 3)  BUN,creat, prior to visit, ICD-9: 4)  HbgA1C prior to visit, ICD-9: Coumadin 2 mg M,W,F ; 1 mg all other days. PT/INR 1 month.Do trim nail closely; see Podiatrist as needed    ANTICOAGULATION RECORD PREVIOUS REGIMEN & LAB RESULTS Anticoagulation Diagnosis:  Atrial fibrillation on  07/20/2008 Previous INR Goal Range:  2.2 on  01/16/2008 Previous INR:  3.6 ratio on  05/27/2009 Previous Coumadin Dose(mg):  2mg  M/W/F, 1mg  all other days on  04/29/2009 Previous Regimen:  no change on  04/29/2009  Previous Coagulation Comments:  recheck 2 weeks on  10/13/2007  NEW REGIMEN & LAB RESULTS Anticoag. Dx: Atrial fibrillation Current INR: 1.9 Regimen: no change  (no change)

## 2010-05-27 NOTE — Letter (Signed)
Summary: London Sheer MD [facility]  London Sheer MD [facility]   Imported By: Lanelle Bal 02/05/2010 13:27:36  _____________________________________________________________________  External Attachment:    Type:   Image     Comment:   External Document

## 2010-05-27 NOTE — Assessment & Plan Note (Signed)
Summary: PT CHECK//PH  Nurse Visit   Vital Signs:  Patient profile:   70 year old male Weight:      215.2 pounds BMI:     30.99 Pulse rate:   70 / minute BP sitting:   124 / 68  (left arm) Cuff size:   large  Vitals Entered By: Shonna Chock CMA (February 03, 2010 10:20 AM) CC: PT check and Flu Vaccine   Allergies: 1)  ! * Altace 2)  ! Amoxicillin 3)  ! Tramadol Hcl (Tramadol Hcl) Laboratory Results   Blood Tests      INR: 2.1   (Normal Range: 0.88-1.12   Therap INR: 2.0-3.5)    Orders Added: 1)  Flu Vaccine 26yrs + MEDICARE PATIENTS [Q2039] 2)  Administration Flu vaccine - MCR [G0008] 3)  Est. Patient Level I [16109] 4)  Protime [60454UJ] Flu Vaccine Consent Questions     Do you have a history of severe allergic reactions to this vaccine? no    Any prior history of allergic reactions to egg and/or gelatin? no    Do you have a sensitivity to the preservative Thimersol? no    Do you have a past history of Guillan-Barre Syndrome? no    Do you currently have an acute febrile illness? no    Have you ever had a severe reaction to latex? no    Vaccine information given and explained to patient? yes    Are you currently pregnant? no    Lot Number:[id number]   Exp Date:10/25/2010   Site Given  Left Deltoid IMistration Flu vaccine - MCR [G0008] .lbmedflu    ANTICOAGULATION RECORD PREVIOUS REGIMEN & LAB RESULTS Anticoagulation Diagnosis:  Atrial fibrillation on  06/24/2009 Previous INR Goal Range:  2-3 on  11/11/2009 Previous INR:  1.9 on  09/09/2009 Previous Coumadin Dose(mg):  2MG  SUN,MON,WED,FRI, 1MG  ALL OTHER DAY on  09/09/2009 Previous Regimen:  3MG  TODAY THEN RESUME CURRENT REGIMEN on  09/09/2009 Previous Coagulation Comments:  recheck 2 weeks on  10/13/2007  NEW REGIMEN & LAB RESULTS Current INR: 2.1 Current Coumadin Dose(mg): 2mg  on M/W/F, 1mg  all other days  Regimen: Same  Provider: Hopper,William       Repeat testing in: 4 weeks MEDICATIONS FENOFIBRATE  MICRONIZED 134 MG CAPS (FENOFIBRATE MICRONIZED) 1 by mouth once daily DIOVAN 160 MG TABS (VALSARTAN) 1daily METFORMIN HCL 500 MG TABS (METFORMIN HCL) 2 bid with largest mealsl WARFARIN SODIUM 2 MG TABS (WARFARIN SODIUM) take as directed FUROSEMIDE 40 MG  TABS (FUROSEMIDE) take 1 tablet by mouth as needed ZYRTEC ALLERGY 10 MG  TABS (CETIRIZINE HCL) 1 by mouth once daily prn PRAVASTATIN SODIUM 20 MG  TABS (PRAVASTATIN SODIUM) 1 qhs CIALIS 20 MG  TABS (TADALAFIL) take as directed FLUOXETINE HCL 10 MG CAPS (FLUOXETINE HCL) 1 once daily   Anticoagulation Visit Questionnaire      Coumadin dose missed/changed:  No      Abnormal Bleeding Symptoms:  No   Any diet changes including alcohol intake, vegetables or greens since the last visit:  No Any illnesses or hospitalizations since the last visit:  No Any signs of clotting since the last visit (including chest discomfort, dizziness, shortness of breath, arm tingling, slurred speech, swelling or redness in leg):  No

## 2010-05-27 NOTE — Assessment & Plan Note (Signed)
Summary: 1 MO. F/U - JR  Nurse Visit   Vital Signs:  Patient profile:   70 year old male Weight:      227.6 pounds Pulse rate:   75 / minute Resp:     17 per minute BP sitting:   136 / 78  (left arm) Cuff size:   large  Vitals Entered By: Shonna Chock (May 27, 2009 9:08 AM)  Primary Care Provider:  Marga Melnick MD  CC:  1.) 1 Month follow-u  2.)  Discuss acid reflux-? what to take that will not interfer with Coumadin  3.) ? if labs schedule for 3 weeks from now can be scheduled done today or if its too early and Hypertension Management.  History of Present Illness: For 1 week he has SS burning with certain foods such as meat, chili beans.  Symptoms occur occa when supine. Aleve prn with golf. Drinking wine 1-2 hrs pre qhs.No exertional symptoms.No dysphagia.No PMH of ERD. He also wants DM assessment . FBS not checked.  Hypertension History:      He denies headache, chest pain, palpitations, dyspnea with exertion, orthopnea, PND, peripheral edema, visual symptoms, neurologic problems, syncope, and side effects from treatment.  He notes no problems with any antihypertensive medication side effects.        Positive major cardiovascular risk factors include male age 7 years old or older, diabetes, hyperlipidemia, and hypertension.  Negative major cardiovascular risk factors include negative family history for ischemic heart disease and non-tobacco-user status.        Further assessment for target organ damage reveals no history of ASHD, stroke/TIA, or peripheral vascular disease.      Physical Exam  General:  in no acute distress; alert,appropriate and cooperative throughout examination;overweight-appearing.   Eyes:  No corneal or conjunctival inflammation noted.No icterus Mouth:  Oral mucosa and oropharynx without lesions or exudates.  No pharyngeal erythema.   Lungs:  Normal respiratory effort, chest expands symmetrically. Lungs are clear to auscultation, no crackles or  wheezes. Heart:  normal rate and regular rhythm.  S4 with slurring   Abdomen:  Bowel sounds positive,abdomen soft and non-tender without masses, organomegaly . Protuberant with ventral hernia Pulses:  R and L carotid,radial,dorsalis pedis and posterior tibial pulses are full and equal bilaterally Extremities:  No clubbing, cyanosis, edema. Neurologic:  alert & oriented X3.   Skin:  Intact without suspicious lesions or rashes. No jaundice Cervical Nodes:  No lymphadenopathy noted Axillary Nodes:  No palpable lymphadenopathy Psych:  memory intact for recent and remote, normally interactive, and good eye contact.      Impression & Recommendations:  Problem # 1:  GERD (ICD-530.81)  His updated medication list for this problem includes:    Ranitidine Hcl 150 Mg Tabs (Ranitidine hcl) .Marland Kitchen... 1 two times a day pre meals  Orders: Venipuncture (29562) TLB-CBC Platelet - w/Differential (85025-CBCD) Prescription Created Electronically [phone])  Problem # 2:  ENCOUNTER FOR LONG-TERM USE OF ANTICOAGULANTS (ICD-V58.61)  Orders: Venipuncture (57846) TLB-PT (Protime) (85610-PTP)  Problem # 3:  ATRIAL FIBRILLATION (ICD-427.31)  PMH of  His updated medication list for this problem includes:    Warfarin Sodium 2 Mg Tabs (Warfarin sodium) .Marland Kitchen... Take as directed  Orders: TLB-TSH (Thyroid Stimulating Hormone) (84443-TSH) TLB-PT (Protime) (85610-PTP)  Problem # 4:  DIABETES MELLITUS, TYPE II, CONTROLLED (ICD-250.00)  His updated medication list for this problem includes:    Diovan 160 Mg Tabs (Valsartan) .Marland Kitchen... 1daily    Metformin Hcl 500 Mg Tabs (Metformin hcl) .Marland KitchenMarland KitchenMarland KitchenMarland Kitchen  1 by mouth two times a day  Orders: Venipuncture (24401) TLB-Hepatic/Liver Function Pnl (80076-HEPATIC) TLB-Lipid Panel (80061-LIPID) TLB-A1C / Hgb A1C (Glycohemoglobin) (83036-A1C) TLB-Microalbumin/Creat Ratio, Urine (82043-MALB)  Problem # 5:  ESSENTIAL HYPERTENSION, BENIGN (ICD-401.1)  controlled His updated medication  list for this problem includes:    Diovan 160 Mg Tabs (Valsartan) .Marland Kitchen... 1daily    Furosemide 40 Mg Tabs (Furosemide) .Marland Kitchen... Take 1 tablet by mouth as needed  Orders: Venipuncture (02725) TLB-Creatinine, Blood (82565-CREA) TLB-Potassium (K+) (84132-K) TLB-BUN (Urea Nitrogen) (84520-BUN) TLB-A1C / Hgb A1C (Glycohemoglobin) (83036-A1C)  Complete Medication List: 1)  Fenofibrate Micronized 134 Mg Caps (Fenofibrate micronized) .Marland Kitchen.. 1 by mouth once daily 2)  Diovan 160 Mg Tabs (Valsartan) .Marland Kitchen.. 1daily 3)  Metformin Hcl 500 Mg Tabs (Metformin hcl) .Marland Kitchen.. 1 by mouth two times a day 4)  Warfarin Sodium 2 Mg Tabs (Warfarin sodium) .... Take as directed 5)  Furosemide 40 Mg Tabs (Furosemide) .... Take 1 tablet by mouth as needed 6)  Zyrtec Allergy 10 Mg Tabs (Cetirizine hcl) .Marland Kitchen.. 1 by mouth once daily prn 7)  Pravastatin Sodium 20 Mg Tabs (Pravastatin sodium) .Marland Kitchen.. 1 qhs 8)  Cialis 20 Mg Tabs (Tadalafil) .... Take as directed 9)  Mvi: Your Life 40+  .Marland Kitchen.. 1 by mouth once daily 10)  Ranitidine Hcl 150 Mg Tabs (Ranitidine hcl) .Marland Kitchen.. 1 two times a day pre meals 11)  Tramadol Hcl 50 Mg Tabs (Tramadol hcl) .Marland Kitchen.. 1 q 6 hrs as needed joint pain  Hypertension Assessment/Plan:      The patient's hypertensive risk group is category C: Target organ damage and/or diabetes.  His calculated 10 year risk of coronary heart disease is 18 %.  Today's blood pressure is 136/78.     Patient Instructions: 1)  Avoid foods high in acid (tomatoes, citrus juices, spicy foods). Avoid eating within two hours of lying down or before exercising. Do not over eat; try smaller more frequent meals. Elevate head of bed twelve inches when sleeping.   Review of Systems General:  Denies weight loss. Eyes:  Denies blurring, double vision, and vision loss-both eyes. ENT:  Complains of hoarseness; denies difficulty swallowing. CV:  Denies lightheadness and near fainting. GI:  Complains of indigestion; denies abdominal pain, bloody stools,  dark tarry stools, gas, and loss of appetite. Derm:  Denies poor wound healing. Neuro:  Denies numbness and tingling. Endo:  Denies excessive hunger, excessive thirst, and excessive urination.   CC: 1.) 1 Month follow-u  2.)  Discuss acid reflux-? what to take that will not interfer with Coumadin  3.) ? if labs schedule for 3 weeks from now can be scheduled done today or if its too early, Hypertension Management Comments REVIEWED MED LIST, PATIENT AGREED DOSE AND INSTRUCTION CORRECT   ** Verified coumadin dose: 2mg  MWF and 1mg  all other days, no missed doses or change in diet.**   Allergies: 1)  ! * Altace 2)  ! Amoxicillin  Orders Added: 1)  Est. Patient Level IV [36644] 2)  Venipuncture [36415] 3)  TLB-CBC Platelet - w/Differential [85025-CBCD] 4)  TLB-Hepatic/Liver Function Pnl [80076-HEPATIC] 5)  TLB-Lipid Panel [80061-LIPID] 6)  TLB-Creatinine, Blood [82565-CREA] 7)  TLB-Potassium (K+) [84132-K] 8)  TLB-BUN (Urea Nitrogen) [84520-BUN] 9)  TLB-A1C / Hgb A1C (Glycohemoglobin) [83036-A1C] 10)  TLB-Microalbumin/Creat Ratio, Urine [82043-MALB] 11)  Prescription Created Electronically [G8553] 12)  TLB-TSH (Thyroid Stimulating Hormone) [84443-TSH] 13)  TLB-PT (Protime) [85610-PTP] Prescriptions: TRAMADOL HCL 50 MG TABS (TRAMADOL HCL) 1 q 6 hrs as needed joint pain  #  30 x 1   Entered and Authorized by:   Marga Melnick MD   Signed by:   Marga Melnick MD on 05/27/2009   Method used:   Faxed to ...       [facility] [phone]* (retail)       [address]       [place], [place]  [address]       Ph: [phone] or [phone]       Fax: [phone]   RxID:   [id number] RANITIDINE HCL 150 MG TABS (RANITIDINE HCL) 1 two times a day pre meals  #60 x 2   Entered and Authorized by:   Marga Melnick MD   Signed by:   Marga Melnick MD on 05/27/2009   Method used:   Faxed to ...       [facility] [address] (retail)       [address]       [place], [place]  [address]       Ph:  [phone] or [phone]       Fax: [phone]   RxID:   [id number]

## 2010-05-27 NOTE — Assessment & Plan Note (Signed)
Summary: PT Check - jr  Nurse Visit   Vital Signs:  Patient profile:   70 year old male Weight:      232.4 pounds Pulse rate:   82 / minute BP sitting:   134 / 86  (left arm) Cuff size:   large  Vitals Entered By: Shonna Chock (April 29, 2009 9:07 AM)  Impression & Recommendations:  Problem # 1:  ENCOUNTER FOR LONG-TERM USE OF ANTICOAGULANTS (ICD-V58.61)  Orders: Est. Patient Level I (19147) Protime (82956OZ)  Problem # 2:  BRADYCARDIA-TACHYCARDIA SYNDROME (ICD-427.81)  His updated medication list for this problem includes:    Warfarin Sodium 2 Mg Tabs (Warfarin sodium) .Marland Kitchen... Take as directed  Complete Medication List: 1)  Fenofibrate Micronized 134 Mg Caps (Fenofibrate micronized) .Marland Kitchen.. 1 by mouth once daily 2)  Diovan 160 Mg Tabs (Valsartan) .Marland Kitchen.. 1daily 3)  Metformin Hcl 500 Mg Tabs (Metformin hcl) .Marland Kitchen.. 1 by mouth two times a day 4)  Warfarin Sodium 2 Mg Tabs (Warfarin sodium) .... Take as directed 5)  Furosemide 40 Mg Tabs (Furosemide) .... Take 1 tablet by mouth as needed 6)  Zyrtec Allergy 10 Mg Tabs (Cetirizine hcl) .Marland Kitchen.. 1 by mouth once daily prn 7)  Pravastatin Sodium 20 Mg Tabs (Pravastatin sodium) .Marland Kitchen.. 1 qhs 8)  Cialis 20 Mg Tabs (Tadalafil) .... Take as directed 9)  Promethazine Vc/codeine 6.25-5-10 Mg/40ml Syrp (Phenyleph-promethazine-cod) .Marland Kitchen.. 1 tsp q 6 hrs as needed   Patient Instructions: 1)  See written instructions  CC: PT Check Comments REVIEWED MED LIST, PATIENT AGREED DOSE AND INSTRUCTION CORRECT    Allergies: 1)  ! * Altace 2)  ! Amoxicillin Laboratory Results   Blood Tests      INR: 2.6   (Normal Range: 0.88-1.12   Therap INR: 2.0-3.5)    Orders Added: 1)  Est. Patient Level I [30865] 2)  Protime [78469GE]   ANTICOAGULATION RECORD PREVIOUS REGIMEN & LAB RESULTS Anticoagulation Diagnosis:  Atrial fibrillation on  07/20/2008 Previous INR Goal Range:  2.2 on  01/16/2008 Previous INR:  2.5 on  04/01/2009 Previous Coumadin  Dose(mg):  2mg  M/W/F, 1mg  all other days on  04/01/2009 Previous Regimen:  3MG  TODAY THEN 1MG  DAILY EXCEPT 2MG  M/W/F on  01/28/2009 Previous Coagulation Comments:  recheck 2 weeks on  10/13/2007  NEW REGIMEN & LAB RESULTS Current INR: 2.6 Current Coumadin Dose(mg): 2mg  M/W/F, 1mg  all other days Regimen: no change  Provider: Hopper,William      Repeat testing in: 4 weeks MEDICATIONS FENOFIBRATE MICRONIZED 134 MG CAPS (FENOFIBRATE MICRONIZED) 1 by mouth once daily DIOVAN 160 MG TABS (VALSARTAN) 1daily METFORMIN HCL 500 MG TABS (METFORMIN HCL) 1 by mouth two times a day WARFARIN SODIUM 2 MG TABS (WARFARIN SODIUM) take as directed FUROSEMIDE 40 MG  TABS (FUROSEMIDE) take 1 tablet by mouth as needed ZYRTEC ALLERGY 10 MG  TABS (CETIRIZINE HCL) 1 by mouth once daily prn PRAVASTATIN SODIUM 20 MG  TABS (PRAVASTATIN SODIUM) 1 qhs CIALIS 20 MG  TABS (TADALAFIL) take as directed PROMETHAZINE VC/CODEINE 6.25-5-10 MG/5ML SYRP (PHENYLEPH-PROMETHAZINE-COD) 1 tsp q 6 hrs as needed   Anticoagulation Visit Questionnaire      Coumadin dose missed/changed:  No      Abnormal Bleeding Symptoms:  No   Any diet changes including alcohol intake, vegetables or greens since the last visit:  No Any illnesses or hospitalizations since the last visit:  No Any signs of clotting since the last visit (including chest discomfort, dizziness, shortness of breath, arm tingling, slurred speech,  swelling or redness in leg):  No

## 2010-05-27 NOTE — Assessment & Plan Note (Signed)
Summary: [facility]   Visit Type:  Follow-up Primary Provider:  Marga Melnick MD   History of Present Illness: The patient presents today for routine electrophysiology followup. He reports doing very well since last being seen in our clinic. The patient denies symptoms of palpitations, chest pain, shortness of breath, orthopnea, PND, lower extremity edema, dizziness, presyncope, syncope, or neurologic sequela. The patient is tolerating medications without difficulties and is otherwise without complaint today.   Current Medications (verified): 1)  Fenofibrate Micronized 134 Mg Caps (Fenofibrate Micronized) .Marland Kitchen.. 1 By Mouth Once Daily 2)  Diovan 160 Mg Tabs (Valsartan) .Marland Kitchen.. 1daily 3)  Metformin Hcl 500 Mg Tabs (Metformin Hcl) .Marland Kitchen.. 1 in Am With B'fast & 2 With Eve Meal 4)  Warfarin Sodium 2 Mg Tabs (Warfarin Sodium) .... Take As Directed 5)  Furosemide 40 Mg  Tabs (Furosemide) .... Take 1 Tablet By Mouth As Needed 6)  Zyrtec Allergy 10 Mg  Tabs (Cetirizine Hcl) .Marland Kitchen.. 1 By Mouth Once Daily Prn 7)  Pravastatin Sodium 20 Mg  Tabs (Pravastatin Sodium) .Marland Kitchen.. 1 Qhs 8)  Cialis 20 Mg  Tabs (Tadalafil) .... Take As Directed 9)  Mvi: Your Life 40+ .Marland Kitchen.. 1 By Mouth Once Daily  Allergies: 1)  ! * Altace 2)  ! Amoxicillin 3)  ! Tramadol Hcl (Tramadol Hcl)  Past History:  Past Medical History: Reviewed history from 12/14/2008 and no changes required. 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 denies tobacco, alcohol or drug use  Vital Signs:  Patient profile:   70 year old male Height:      70 inches Weight:      223 pounds BMI:     32.11 Pulse rate:   56 / minute BP sitting:   112 / 70  (left arm)  Vitals Entered By: Laurance Flatten CMA (June 28, 2009 10:06 AM)  Physical Exam  General:  obese, NAD Head:  normocephalic and atraumatic Eyes:  PERRLA/EOM intact; conjunctiva and lids normal. Mouth:  Teeth, gums and palate normal. Oral mucosa normal. Neck:  Neck supple, no JVD. No masses, thyromegaly or abnormal cervical nodes. Chest Wall:  pacemaker site is well healed Lungs:  Clear bilaterally to auscultation and percussion. Heart:  Non-displaced PMI, chest non-tender; regular rate and rhythm, S1, S2 without murmurs, rubs or gallops. Carotid upstroke normal, no bruit. Normal abdominal aortic size, no bruits. Femorals normal pulses, no bruits. Pedals normal pulses. No edema, no varicosities. Abdomen:  Bowel sounds positive; abdomen soft and non-tender without masses, organomegaly, or hernias noted. No hepatosplenomegaly. Msk:  Back normal, normal gait. Muscle strength and tone normal. Neurologic:  Alert and oriented x 3.   PPM Specifications Following MD:  Hillis Range, MD     PPM Vendor:  [employer]     PPM Model Number:  [id number]     PPM Serial Number:  [id number] PPM DOI:  06/12/2008     PPM Implanting MD:  Hillis Range, MD  Lead 1    Location: [facility]     DOI: 06/12/2008     Model #: [id number]      Serial #: [id number]     Status: active Lead 2    Location: [facility]     DOI: 06/12/2008     Model #: [id number]     Serial #: [id number]     Status: active  Magnet Response Rate:  BOL 98.6 ERI  86.3  Indications:  A-flutter Heart block   PPM Follow Up Remote Check?  No Battery Voltage:  2.80 V     Battery Est. Longevity:  >10 YEARS     Pacer Dependent:  No       PPM Device Measurements Atrium  Amplitude: 4.6 mV, Impedance: 449 ohms, Threshold: 0.5 V at 0.4 msec Right Ventricle  Amplitude: 12 mV, Impedance: 512 ohms, Threshold: 0.75 V at 0.4 msec  Episodes MS Episodes:  4964     Percent Mode Switch:  16%     Coumadin:  Yes Ventricular High Rate:  0     Atrial Pacing:  12%     Ventricular Pacing:  89%  Parameters Mode:  DDD     Lower Rate Limit:  60     Upper Rate Limit:  120 Paced AV Delay:  200     Sensed AV Delay:  180 Next Cardiology Appt Due:  12/26/2009 Tech Comments:  Normal device function.  EGM storage turned off today.  Battery longevity increased from 6 to >10 years.  No other chnages made today. Ventricular autocapture already on.  Pt know known afib.  V rates relatively well controlled. 2.7%PVC's.  V autocapture trends show occasional spikes, autocapture algorithm gets accurate threshold today.  ROV 6 months clinic. 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  He will contemplate this and contact my office if he wishes to change  His updated medication list for this problem includes:    Warfarin Sodium 2 Mg Tabs (Warfarin sodium) .Marland Kitchen... Take as directed  Patient Instructions: 1)  Your physician recommends that you schedule a follow-up appointment in: 6 months with Dr Johney Frame

## 2010-05-27 NOTE — Progress Notes (Signed)
Summary: Re-Instruct on Blood Thinner  Phone Note Outgoing Call Call back at [facility] Phone [phone]   Call placed by: Shonna Chock,  July 24, 2009 8:28 AM Call placed to: Patient Summary of Call: Please verify he was on 2 mg m,w,f & 1 mg (1/2 pill) all other days. Change to 2 mg m,w,f,& sun with 1 mg t,th,sat. PT 4 weeks   Spoke with patient, patient was taking 2mg  m,w,f & 1mg  (1/2pill) all other days. Patient was given the new instruction and ok'd .Fredric Mare Malloy  July 24, 2009 8:30 AM

## 2010-05-27 NOTE — Assessment & Plan Note (Signed)
Summary: BRONCHITIS?//PH   Vital Signs:  Patient profile:   70 year old male Weight:      211.2 pounds BMI:     30.41 O2 Sat:      98 % Temp:     97.7 degrees F oral Pulse rate:   72 / minute Resp:     17 per minute BP sitting:   116 / 68  (left arm) Cuff size:   large  Vitals Entered By: Shonna Chock CMA (February 24, 2010 4:45 PM) CC: Bronchitis, URI symptoms   Primary Care Provider:  Marga Melnick MD  CC:  Bronchitis and URI symptoms.  History of Present Illness:  RTI Symptoms      This is a 70 year old man who presents with  RTI symptoms since 10/15 . Onset was head congestion , rhinitis & ST.  The patient reports  recurrence of sore throat and dry cough, but denies  purulent nasal discharge, and earache.  Associated symptoms include some  wheezing ( no PMH of asthma).  The patient denies fever and dyspnea.  The patient denies headache.  The patient denies the following risk factors for Strep sinusitis: unilateral facial pain, tooth pain, and tender adenopathy.   Rx: Chloraseptic, Rx cough syrup, Robitussin DM  Current Medications (verified): 1)  Fenofibrate Micronized 134 Mg Caps (Fenofibrate Micronized) .Marland Kitchen.. 1 By Mouth Once Daily 2)  Diovan 160 Mg Tabs (Valsartan) .Marland Kitchen.. 1daily 3)  Metformin Hcl 500 Mg Tabs (Metformin Hcl) .... 2 Bid With Largest Mealsl 4)  Warfarin Sodium 2 Mg Tabs (Warfarin Sodium) .... Take As Directed 5)  Furosemide 40 Mg  Tabs (Furosemide) .... Take 1 Tablet By Mouth As Needed 6)  Zyrtec Allergy 10 Mg  Tabs (Cetirizine Hcl) .Marland Kitchen.. 1 By Mouth Once Daily Prn 7)  Pravastatin Sodium 20 Mg  Tabs (Pravastatin Sodium) .Marland Kitchen.. 1 Qhs 8)  Cialis 20 Mg  Tabs (Tadalafil) .... Take As Directed 9)  Fluoxetine Hcl 10 Mg Caps (Fluoxetine Hcl) .Marland Kitchen.. 1 Once Daily  Allergies: 1)  ! * Altace 2)  ! Amoxicillin 3)  ! Tramadol Hcl (Tramadol Hcl)  Physical Exam  General:  well-nourished,in no acute distress; alert,appropriate and cooperative throughout examination Ears:   External ear exam shows no significant lesions or deformities.  Otoscopic examination reveals clear canals, tympanic membranes are intact bilaterally without bulging, retraction, inflammation or discharge. Hearing is grossly normal bilaterally. Nose:  External nasal examination shows no deformity or inflammation. Nasal mucosa are pink and moist without lesions or exudates. Mouth:  Oral mucosa and oropharynx without lesions or exudates.   Uvular edema & erythema Lungs:  Normal respiratory effort, chest expands symmetrically. Lungs are clear to auscultation, no crackles or wheezes, but dry cough Heart:  normal rate, regular rhythm, no murmur, no gallop, and no rub.   Cervical Nodes:  No lymphadenopathy noted Axillary Nodes:  No palpable lymphadenopathy   Impression & Recommendations:  Problem # 1:  BRONCHITIS-ACUTE (ICD-466.0)  His updated medication list for this problem includes:    Doxycycline Hyclate 100 Mg Caps (Doxycycline hyclate) .Marland Kitchen... 1 two times a day x 2 days then 1 once daily ; avoid sun    Promethazine-codeine 6.25-10 Mg/50ml Syrp (Promethazine-codeine) .Marland Kitchen... 1 tsp every 6 hrs as needed  Problem # 2:  BRADYCARDIA-TACHYCARDIA SYNDROME (ICD-427.81)  His updated medication list for this problem includes:    Warfarin Sodium 2 Mg Tabs (Warfarin sodium) .Marland Kitchen... 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 1 by mouth once daily 2)  Diovan 160 Mg Tabs (Valsartan) .Marland Kitchen.. 1daily 3)  Metformin Hcl 500 Mg Tabs (Metformin hcl) .... 2 bid with largest mealsl 4)  Warfarin Sodium 2 Mg Tabs (Warfarin sodium) .... Take as directed 5)  Furosemide 40 Mg Tabs (Furosemide) .... Take 1 tablet by mouth as needed 6)  Zyrtec Allergy 10 Mg Tabs (Cetirizine hcl) .Marland Kitchen.. 1 by mouth once daily prn 7)  Pravastatin Sodium 20 Mg Tabs (Pravastatin sodium) .Marland Kitchen.. 1 qhs 8)  Cialis 20 Mg  Tabs (Tadalafil) .... Take as directed 9)  Fluoxetine Hcl 10 Mg Caps (Fluoxetine hcl) .Marland Kitchen.. 1 once daily 10)  Doxycycline Hyclate 100 Mg Caps (Doxycycline hyclate) .Marland Kitchen.. 1 two times a day x 2 days then 1 once daily ; avoid sun 11)  Promethazine-codeine 6.25-10 Mg/46ml Syrp (Promethazine-codeine) .Marland Kitchen.. 1 tsp every 6 hrs as needed  Patient Instructions: 1)  PT/INR this Thurs 11/03  because of antibiotics . 2)  Drink as much NON dairy  fluid as you can tolerate for the next few days. Prescriptions: PROMETHAZINE-CODEINE 6.25-10 MG/5ML SYRP (PROMETHAZINE-CODEINE) 1 tsp every 6 hrs as needed  #120cc x 0   Entered and Authorized by:   Marga Melnick MD   Signed by:   Marga Melnick MD on 02/24/2010   Method used:   Print then Give to Patient   RxID:   [id number] DOXYCYCLINE HYCLATE 100 MG CAPS (DOXYCYCLINE HYCLATE) 1 two times a day X 2 days then 1 once daily ; avoid sun  #12 x 0   Entered and Authorized by:   Marga Melnick MD   Signed by:   Marga Melnick MD on 02/24/2010   Method used:   Print then Give to Patient   RxID:   [id number]    Orders Added: 1)  Est. Patient Level III [84696]

## 2010-05-27 NOTE — Assessment & Plan Note (Signed)
Summary: PT CHECK///SPH  Nurse Visit   Vital Signs:  Patient profile:   70 year old male Height:      70 inches (177.80 cm) Weight:      211.25 pounds (96.02 kg) BMI:     30.42 O2 Sat:      97 % on Room air Temp:     98.0 degrees F (36.67 degrees C) oral Pulse rate:   60 / minute BP sitting:   104 / 56  (left arm) Cuff size:   large  Vitals Entered By: Lucious Groves CMA (February 27, 2010 10:17 AM)  O2 Flow:  Room air CC: PT check./kb   Allergies: 1)  ! * Altace 2)  ! Amoxicillin 3)  ! Tramadol Hcl (Tramadol Hcl) Laboratory Results   Blood Tests      INR: 2.2   (Normal Range: 0.88-1.12   Therap INR: 2.0-3.5)    Orders Added: 1)  Est. Patient Level I [22025] 2)  Protime [42706CB]   ANTICOAGULATION RECORD PREVIOUS REGIMEN & LAB RESULTS Anticoagulation Diagnosis:  Atrial fibrillation on  06/24/2009 Previous INR Goal Range:  2-3 on  11/11/2009 Previous INR:  2.1 on  02/03/2010 Previous Coumadin Dose(mg):  2mg  on M/W/F, 1mg  all other days  on  02/03/2010 Previous Regimen:  Same on  02/03/2010 Previous Coagulation Comments:  recheck 2 weeks on  10/13/2007  NEW REGIMEN & LAB RESULTS Anticoag. Dx: Atrial fibrillation Current INR: 2.2 Current Coumadin Dose(mg): 2mg  MWF and 1mg  all other days Regimen: Same  (no change)  Provider: Alwyn Ren Repeat testing in: 4 weeks Other Comments: Patient notes that he takes 2mg  on MWF and 1mg  all other days. No change will re-check in 4 weeks. Lucious Groves CMA  February 27, 2010 10:18 AM    Anticoagulation Visit Questionnaire Coumadin dose missed/changed:  No Abnormal Bleeding Symptoms:  No  Any diet changes including alcohol intake, vegetables or greens since the last visit:  No Any illnesses or hospitalizations since the last visit:  No Any signs of clotting since the last visit (including chest discomfort, dizziness, shortness of breath, arm tingling, slurred speech, swelling or redness in leg):  No  MEDICATIONS FENOFIBRATE  MICRONIZED 134 MG CAPS (FENOFIBRATE MICRONIZED) 1 by mouth once daily DIOVAN 160 MG TABS (VALSARTAN) 1daily METFORMIN HCL 500 MG TABS (METFORMIN HCL) 2 bid with largest mealsl WARFARIN SODIUM 2 MG TABS (WARFARIN SODIUM) take as directed FUROSEMIDE 40 MG  TABS (FUROSEMIDE) take 1 tablet by mouth as needed ZYRTEC ALLERGY 10 MG  TABS (CETIRIZINE HCL) 1 by mouth once daily prn PRAVASTATIN SODIUM 20 MG  TABS (PRAVASTATIN SODIUM) 1 qhs CIALIS 20 MG  TABS (TADALAFIL) take as directed FLUOXETINE HCL 10 MG CAPS (FLUOXETINE HCL) 1 once daily DOXYCYCLINE HYCLATE 100 MG CAPS (DOXYCYCLINE HYCLATE) 1 two times a day X 2 days then 1 once daily ; avoid sun PROMETHAZINE-CODEINE 6.25-10 MG/5ML SYRP (PROMETHAZINE-CODEINE) 1 tsp every 6 hrs as needed

## 2010-05-27 NOTE — Assessment & Plan Note (Signed)
Summary: rto 4 months.cbs   Vital Signs:  Patient profile:   70 year old male Weight:      219.4 pounds Temp:     97.8 degrees F oral Pulse rate:   72 / minute Resp:     17 per minute BP sitting:   124 / 70  (left arm) Cuff size:   large  Vitals Entered By: Shonna Chock CMA (January 13, 2010 9:39 AM) CC: 4 month follow-up and discuss labs (patient with copy), Type 2 diabetes mellitus follow-up   Primary Care Provider:  Marga Melnick MD  CC:  4 month follow-up and discuss labs (patient with copy) and Type 2 diabetes mellitus follow-up.  History of Present Illness: Type 2 Diabetes Mellitus Follow-Up      This is a Albert Hawkins who presents for Type 2 diabetes mellitus follow-up.  The patient denies polyuria, polydipsia, blurred vision, self managed hypoglycemia, significant  weight gain, and numbness of extremities.  The patient denies the following symptoms: neuropathic pain, chest pain ( but AF recurred), vomiting, orthostatic symptoms, poor wound healing, intermitte due to family concerns/ stresses  and not exercising regularly.  The patient has been measuring capillary blood glucose before breakfast  rarely ( 130-140)and  2 hrs after dinner( < 176).  Since the last visit, the patient reports having had no eye care, but he has appt 09/20.  A1c 7.2 % with sugar average 160 & risk 44%.  Current Medications (verified): 1)  Fenofibrate Micronized 134 Mg Caps (Fenofibrate Micronized) .Marland Kitchen.. 1 By Mouth Once Daily 2)  Diovan 160 Mg Tabs (Valsartan) .Marland Kitchen.. 1daily 3)  Metformin Hcl 500 Mg Tabs (Metformin Hcl) .Marland Kitchen.. 1 in Am With B'fast & 2 With Eve Meal 4)  Warfarin Sodium 2 Mg Tabs (Warfarin Sodium) .... Take As Directed 5)  Furosemide 40 Mg  Tabs (Furosemide) .... Take 1 Tablet By Mouth As Needed 6)  Zyrtec Allergy 10 Mg  Tabs (Cetirizine Hcl) .Marland Kitchen.. 1 By Mouth Once Daily Prn 7)  Pravastatin Sodium 20 Mg  Tabs (Pravastatin Sodium) .Marland Kitchen.. 1 Qhs 8)  Cialis 20 Mg  Tabs (Tadalafil) .... Take As  Directed  Allergies: 1)  ! * Altace 2)  ! Amoxicillin 3)  ! Tramadol Hcl (Tramadol Hcl)  Review of Systems Psych:  Complains of anxiety, depression, easily angered, easily tearful, and irritability; Difficulty sleeping. Caring for elderly mother . His daughter & grandchild had been living with them..  Physical Exam  General:  in no acute distress; alert,appropriate and cooperative throughout examination Lungs:  Normal respiratory effort, chest expands symmetrically. Lungs are clear to auscultation, no crackles or wheezes. Heart:  Normal rate and regular rhythm. S1 and S2 normal without gallop, murmur, click, rub or other extra sounds.( AF not present) Pulses:  R and L carotid,radial,dorsalis pedis and posterior tibial pulses are full and equal bilaterally Extremities:  No clubbing, cyanosis, edema. Pes planus. Mild toenail  deformities w/o active fungal changes. Neurologic:  alert & oriented X3 and sensation  to light touch decreased over dorsum L foot.   Skin:  Intact without suspicious lesions or rashes Psych:  memory intact for recent and remote, normally interactive, good eye contact, not anxious appearing, and not depressed appearing but see history.     Impression & Recommendations:  Problem # 1:  DIABETES MELLITUS, UNCONTROLLED (ICD-250.02)  His updated medication list for this problem includes:    Diovan 160 Mg Tabs (Valsartan) .Marland Kitchen... 1daily    Metformin Hcl 500 Mg Tabs (  Metformin hcl) .Marland Kitchen... 2 bid with largest mealsl  Problem # 2:  DEPRESSION (ICD-311)  meds declined; pathophysiology of Neurotransmitter Deficiency disussed   His updated medication list for this problem includes:    Fluoxetine Hcl 10 Mg Caps (Fluoxetine hcl) .Marland Kitchen... 1 once daily  Complete Medication List: 1)  Fenofibrate Micronized 134 Mg Caps (Fenofibrate micronized) .Marland Kitchen.. 1 by mouth once daily 2)  Diovan 160 Mg Tabs (Valsartan) .Marland Kitchen.. 1daily 3)  Metformin Hcl 500 Mg Tabs (Metformin hcl) .... 2 bid with  largest mealsl 4)  Warfarin Sodium 2 Mg Tabs (Warfarin sodium) .... Take as directed 5)  Furosemide 40 Mg Tabs (Furosemide) .... Take 1 tablet by mouth as needed 6)  Zyrtec Allergy 10 Mg Tabs (Cetirizine hcl) .Marland Kitchen.. 1 by mouth once daily prn 7)  Pravastatin Sodium 20 Mg Tabs (Pravastatin sodium) .Marland Kitchen.. 1 qhs 8)  Cialis 20 Mg Tabs (Tadalafil) .... Take as directed 9)  Fluoxetine Hcl 10 Mg Caps (Fluoxetine hcl) .Marland Kitchen.. 1 once daily  Patient Instructions: 1)  Consider a trial of Fluoxetine . 2)  Please schedule a follow-up appointment in 3 months. 3)  BUN,creat,K+ prior to visit, ICD-9:995.20 4)  HbgA1C prior to visit, ICD-9:250.02 5)  Urine Microalbumin prior to visit, ICD-9:250.02. Consume < 40 grams of sugar/day from High Fructose Corn Syrup. Prescriptions: FLUOXETINE HCL 10 MG CAPS (FLUOXETINE HCL) 1 once daily  #30 x 3   Entered and Authorized by:   Marga Melnick MD   Signed by:   Marga Melnick MD on 01/13/2010   Method used:   Print then Give to Patient   RxID:   [id number]

## 2010-05-27 NOTE — Assessment & Plan Note (Signed)
Summary: sorethroat/cough//kn   Vital Signs:  Patient profile:   70 year old male Weight:      221.2 pounds Temp:     97.8 degrees F oral Pulse rate:   80 / minute Resp:     16 per minute BP sitting:   112 / 70  (left arm) Cuff size:   large  Vitals Entered By: Shonna Chock CMA (November 26, 2009 1:46 PM) CC: Sorethroat and cough since last Friday, URI symptoms   Primary Care Provider:  Marga Melnick MD  CC:  Sorethroat and cough since last Friday and URI symptoms.  History of Present Illness:  URI Symptoms      This is a 69 year old man who presents with URI symptoms as of of 07/29 with ST which improved with Cloraseptic  spray.  The patient reports nasal congestion, sore throat recurrence , and dry cough, but denies purulent nasal discharge and earache.  The patient denies fever, dyspnea, wheezing, vomiting, and diarrhea.  The patient denies  rontal  headache and severe fatigue.  The patient denies the following risk factors for Strep sinusitis: unilateral facial pain.  FBS rarely checked; 2 hr post meal < 160. PT/INR checked  7/18  & was therapeutic @ 2.4.  Current Medications (verified): 1)  Fenofibrate Micronized 134 Mg Caps (Fenofibrate Micronized) .Marland Kitchen.. 1 By Mouth Once Daily 2)  Diovan 160 Mg Tabs (Valsartan) .Marland Kitchen.. 1daily 3)  Metformin Hcl 500 Mg Tabs (Metformin Hcl) .Marland Kitchen.. 1 in Am With B'fast & 2 With Eve Meal 4)  Warfarin Sodium 2 Mg Tabs (Warfarin Sodium) .... Take As Directed 5)  Furosemide 40 Mg  Tabs (Furosemide) .... Take 1 Tablet By Mouth As Needed 6)  Zyrtec Allergy 10 Mg  Tabs (Cetirizine Hcl) .Marland Kitchen.. 1 By Mouth Once Daily Prn 7)  Pravastatin Sodium 20 Mg  Tabs (Pravastatin Sodium) .Marland Kitchen.. 1 Qhs 8)  Cialis 20 Mg  Tabs (Tadalafil) .... Take As Directed 9)  Mvi: Your Life 40+ .Marland Kitchen.. 1 By Mouth Once Daily  Allergies: 1)  ! * Altace 2)  ! Amoxicillin 3)  ! Tramadol Hcl (Tramadol Hcl)  Physical Exam  General:  in no acute distress; alert,appropriate and cooperative  throughout examination Ears:  External ear exam shows no significant lesions or deformities.  Otoscopic examination reveals clear canals, tympanic membranes are intact bilaterally without bulging, retraction, inflammation or discharge. Hearing is grossly normal bilaterally. Some wax bilaterally Nose:  External nasal examination shows no deformity or inflammation. Nasal mucosa are  dry without lesions or exudates. Mouth:  Oral mucosa and oropharynx without lesions or exudates.  Teeth in good repair. Lungs:  Normal respiratory effort, chest expands symmetrically. Lungs are clear to auscultation, no crackles or wheezes.Slightly rattly cough Cervical Nodes:  No lymphadenopathy noted Axillary Nodes:  No palpable lymphadenopathy   Impression & Recommendations:  Problem # 1:  URI (ICD-465.9)  His updated medication list for this problem includes:    Zyrtec Allergy 10 Mg Tabs (Cetirizine hcl) .Marland Kitchen... 1 by mouth once daily prn    Promethazine Vc/codeine 6.25-5-10 Mg/54ml Syrp (Phenyleph-promethazine-cod) .Marland Kitchen... 1 tsp every 6 hrs  as needed cough  Problem # 2:  PHARYNGITIS-ACUTE (ICD-462)  His updated medication list for this problem includes:    Doxycycline Hyclate 100 Mg Caps (Doxycycline hyclate) .Marland Kitchen... 1 by mouth two times a day x 2 days , then once daily  Problem # 3:  DIABETES MELLITUS, TYPE II, CONTROLLED (ICD-250.00)  His updated medication list for this problem  includes:    Diovan 160 Mg Tabs (Valsartan) .Marland Kitchen... 1daily    Metformin Hcl 500 Mg Tabs (Metformin hcl) .Marland Kitchen... 1 in am with b'fast & 2 with eve meal  Complete Medication List: 1)  Fenofibrate Micronized 134 Mg Caps (Fenofibrate micronized) .Marland Kitchen.. 1 by mouth once daily 2)  Diovan 160 Mg Tabs (Valsartan) .Marland Kitchen.. 1daily 3)  Metformin Hcl 500 Mg Tabs (Metformin hcl) .Marland Kitchen.. 1 in am with b'fast & 2 with eve meal 4)  Warfarin Sodium 2 Mg Tabs (Warfarin sodium) .... Take as directed 5)  Furosemide 40 Mg Tabs (Furosemide) .... Take 1 tablet by mouth  as needed 6)  Zyrtec Allergy 10 Mg Tabs (Cetirizine hcl) .Marland Kitchen.. 1 by mouth once daily prn 7)  Pravastatin Sodium 20 Mg Tabs (Pravastatin sodium) .Marland Kitchen.. 1 qhs 8)  Cialis 20 Mg Tabs (Tadalafil) .... Take as directed 9)  Mvi: Your Life 40+  .Marland Kitchen.. 1 by mouth once daily 10)  Promethazine Vc/codeine 6.25-5-10 Mg/25ml Syrp (Phenyleph-promethazine-cod) .Marland Kitchen.. 1 tsp every 6 hrs  as needed cough 11)  Doxycycline Hyclate 100 Mg Caps (Doxycycline hyclate) .Marland Kitchen.. 1 by mouth two times a day x 2 days , then once daily  Other Orders: Rapid Strep ([address])  Patient Instructions: 1)  Drink as much fluid as you can tolerate for the next few days. Check PT/INR on Mon 12/02/2009.Check your blood sugars regularly. If your readings are usually above :150 or below 90 you should contact our office. Prescriptions: DOXYCYCLINE HYCLATE 100 MG CAPS (DOXYCYCLINE HYCLATE) 1 by mouth two times a day X 2 days , then once daily  #12 x 0   Entered and Authorized by:   Marga Melnick MD   Signed by:   Marga Melnick MD on 11/26/2009   Method used:   Printed then faxed to ...       [facility] [phone]* (retail)       [address]       [place], [place]  [address]       Ph: [phone] or [phone]       Fax: [phone]   RxID:   [id number] PROMETHAZINE VC/CODEINE 6.25-5-10 MG/5ML SYRP (PHENYLEPH-PROMETHAZINE-COD) 1 tsp every 6 hrs  as needed cough  #120cc x 0   Entered and Authorized by:   Marga Melnick MD   Signed by:   Marga Melnick MD on 11/26/2009   Method used:   Printed then faxed to ...       [facility] [address] (retail)       [address]       [place], [place]  [address]       Ph: [phone] or [phone]       Fax: [phone]   RxID:   [id number]   Laboratory Results    Other Tests  Rapid Strep: negative

## 2010-05-27 NOTE — Progress Notes (Signed)
Summary: Lab Results  Phone Note Outgoing Call   Call placed by: Shonna Chock,  May 28, 2009 9:49 AM Call placed to: Patient Summary of Call: Spoke with patient's wife, Information ok'd  Good ; no anemia . Platelet count minimally decreased, not significant.Liver function tests are  normal. Diabetes poorly controlled. Read ALL dood & drink labels . Aoid ALL  foods & drinks with High Fructose Corn Syrup as #1,2 or #3 on label. HFCS causes Triglyceride elevations (your TG are TWIVE normal @ 300) & cause uncontrolled Diabetes. Recheck fasting lipids & A1c after 10 weeks of avoiding HFCS.  Decrease Coumadin by 1/2 pill every Weds ; recheck PT/INR in 4 weeks . Hopp  Labs to be [facility]  May 28, 2009 9:51 AM

## 2010-05-27 NOTE — Cardiovascular Report (Signed)
Summary: Office Visit   Office Visit   Imported By: Roderic Ovens 01/14/2010 14:29:28  _____________________________________________________________________  External Attachment:    Type:   Image     Comment:   External Document

## 2010-05-27 NOTE — Progress Notes (Signed)
Summary: refill request  Phone Note Refill Request Message from:  Patient  Refills Requested: Medication #1:  METFORMIN HCL 500 MG TABS 1 in am with b'fast & 2 with eve meal [facility]   Method Requested: Fax to [facility] Initial call taken by: Shonna Chock,  August 20, 2009 9:27 AM    Prescriptions: METFORMIN HCL 500 MG TABS (METFORMIN HCL) 1 in am with b'fast & 2 with eve meal  #270 x 1   Entered by:   Shonna Chock   Authorized by:   Marga Melnick MD   Signed by:   Shonna Chock on 08/20/2009   Method used:   Faxed to ...       [facility]* (mail-order)             ,          Ph: [phone]       Fax: [phone]   RxID:   [id number]

## 2010-05-27 NOTE — Assessment & Plan Note (Signed)
Summary: 3 mth fu/kdc   Vital Signs:  Patient profile:   70 year old male Weight:      221.0 pounds Pulse rate:   57 / minute Resp:     16 per minute BP sitting:   106 / 68  (left arm) Cuff size:   large  Vitals Entered By: Shonna Chock (Sep 16, 2009 9:31 AM) CC: 3 month follow-up: copy of labs given Comments REVIEWED MED LIST, PATIENT AGREED DOSE AND INSTRUCTION CORRECT    Primary Care Provider:  Marga Melnick MD  CC:  3 month follow-up: copy of labs given.  History of Present Illness: FBS 120-216; 2 hrs post meal < 230. No hypoglycemia; weight down 8-10# in 6 months.No CVE ; no diet.  Allergies: 1)  ! * Altace 2)  ! Amoxicillin 3)  ! Tramadol Hcl (Tramadol Hcl)  Review of Systems General:  Complains of fatigue and sleep disorder; Joints affect sleep; Arthritis Strength Tylenol helps. Eyes:  Denies blurring, double vision, and vision loss-both eyes. CV:  Denies chest pain or discomfort, leg cramps with exertion, lightheadness, near fainting, palpitations, shortness of breath with exertion, swelling of feet, and swelling of hands. MS:  Complains of cramps; Arm cramps with golf . Derm:  Denies poor wound healing. Neuro:  Denies numbness and tingling. Endo:  Denies excessive hunger, excessive thirst, and excessive urination.  Physical Exam  General:  well-nourished; alert,appropriate and cooperative throughout examination Lungs:  Normal respiratory effort, chest expands symmetrically. Lungs are clear to auscultation, no crackles or wheezes. Heart:  Normal rate and regular rhythm. S1 and S2 normal without gallop, murmur, click, rub . S4 with slurring Pulses:  R and L carotid,radial,dorsalis pedis and posterior tibial pulses are full and equal bilaterally Extremities:  No clubbing, cyanosis, edema.Pes planus. Absent R great toenail . Mild fungal changes on L Neurologic:  alert & oriented X3 and sensation intact to light touch decreased slightly  over R sole.   Skin:   Intact without suspicious lesions or rashes Psych:  memory intact for recent and remote, normally interactive, and good eye contact.     Impression & Recommendations:  Problem # 1:  DIABETES MELLITUS, TYPE II, CONTROLLED (ICD-250.00) A1c rising His updated medication list for this problem includes:    Diovan 160 Mg Tabs (Valsartan) .Marland Kitchen... 1daily    Metformin Hcl 500 Mg Tabs (Metformin hcl) .Marland Kitchen... 1 in am with b'fast & 2 with eve meal  Problem # 2:  ESSENTIAL HYPERTENSION, BENIGN (ICD-401.1) controlled His updated medication list for this problem includes:    Diovan 160 Mg Tabs (Valsartan) .Marland Kitchen... 1daily    Furosemide 40 Mg Tabs (Furosemide) .Marland Kitchen... Take 1 tablet by mouth as needed  Complete Medication List: 1)  Fenofibrate Micronized 134 Mg Caps (Fenofibrate micronized) .Marland Kitchen.. 1 by mouth once daily 2)  Diovan 160 Mg Tabs (Valsartan) .Marland Kitchen.. 1daily 3)  Metformin Hcl 500 Mg Tabs (Metformin hcl) .Marland Kitchen.. 1 in am with b'fast & 2 with eve meal 4)  Warfarin Sodium 2 Mg Tabs (Warfarin sodium) .... Take as directed 5)  Furosemide 40 Mg Tabs (Furosemide) .... Take 1 tablet by mouth as needed 6)  Zyrtec Allergy 10 Mg Tabs (Cetirizine hcl) .Marland Kitchen.. 1 by mouth once daily prn 7)  Pravastatin Sodium 20 Mg Tabs (Pravastatin sodium) .Marland Kitchen.. 1 qhs 8)  Cialis 20 Mg Tabs (Tadalafil) .... Take as directed 9)  Mvi: Your Life 40+  .Marland Kitchen.. 1 by mouth once daily  Patient Instructions: 1)  Consume LESS THAN 40  grams of High Fructose Corn Syrup "sugar" / day.HbgA1C prior to visit, ICD-9:250.00;Urine Microalbumin prior to visit, ICD-9:250.00.Please schedule a follow-up appointment in 4 months.See your eye doctor yearly to check for diabetic eye damage.Check your feet each night for sore areas, calluses or signs of infection.

## 2010-05-27 NOTE — Assessment & Plan Note (Signed)
Summary: per check out/sf   Visit Type:  Pacemaker check Primary Provider:  Marga Melnick MD  CC:  fatigue...no other complaints today.  History of Present Illness: The patient presents today for routine electrophysiology followup. He reports doing very well since last being seen in our clinic. The patient denies symptoms of palpitations, chest pain, shortness of breath, orthopnea, PND, lower extremity edema, dizziness, presyncope, syncope, or neurologic sequela. The patient is tolerating medications without difficulties and is otherwise without complaint today.   Current Medications (verified): 1)  Fenofibrate Micronized 134 Mg Caps (Fenofibrate Micronized) .Marland Kitchen.. 1 By Mouth Once Daily 2)  Diovan 160 Mg Tabs (Valsartan) .Marland Kitchen.. 1daily 3)  Metformin Hcl 500 Mg Tabs (Metformin Hcl) .Marland Kitchen.. 1 in Am With B'fast & 2 With Eve Meal 4)  Warfarin Sodium 2 Mg Tabs (Warfarin Sodium) .... Take As Directed 5)  Furosemide 40 Mg  Tabs (Furosemide) .... Take 1 Tablet By Mouth As Needed 6)  Zyrtec Allergy 10 Mg  Tabs (Cetirizine Hcl) .Marland Kitchen.. 1 By Mouth Once Daily Prn 7)  Pravastatin Sodium 20 Mg  Tabs (Pravastatin Sodium) .Marland Kitchen.. 1 Qhs 8)  Cialis 20 Mg  Tabs (Tadalafil) .... Take As Directed 9)  Promethazine Vc/codeine 6.25-5-10 Mg/71ml Syrp (Phenyleph-Promethazine-Cod) .Marland Kitchen.. 1 Tsp Every 6 Hrs  As Needed Cough  Allergies: 1)  ! * Altace 2)  ! Amoxicillin 3)  ! Tramadol Hcl (Tramadol Hcl)  Past History:  Past Medical History: Reviewed history from 12/14/2008 and no changes required. 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 denies tobacco, alcohol or drug use  Review of Systems       All systems are reviewed and negative except as listed in the HPI.   Vital Signs:  Patient profile:   70 year old male Height:      70 inches Weight:      217 pounds BMI:     31.25 Pulse rate:   74 / minute Pulse rhythm:   regular BP sitting:   132 / 74  (left arm) Cuff size:   large  Vitals Entered By: Danielle Rankin, CMA (January 08, 2010 9:51 AM)  Physical Exam  General:  Well developed, well nourished, in no acute distress. Head:  normocephalic and atraumatic Eyes:  PERRLA/EOM intact; conjunctiva and lids normal. Mouth:  Teeth, gums and palate normal. Oral mucosa normal. Neck:  Neck supple, no JVD. No masses, thyromegaly or abnormal cervical nodes. Chest Wall:  pacemaker site is well healed Lungs:  Clear bilaterally to auscultation and percussion. Heart:  Non-displaced PMI, chest non-tender; regular rate and rhythm, S1, S2 without murmurs, rubs or gallops. Carotid upstroke normal, no bruit. Normal abdominal aortic size, no bruits. Femorals normal pulses, no bruits. Pedals normal pulses. No edema, no varicosities. Abdomen:  Bowel sounds positive; abdomen soft and non-tender  without masses, organomegaly, or hernias noted. No hepatosplenomegaly. Msk:  Back normal, normal gait. Muscle strength and tone  normal. Pulses:  pulses normal in all 4 extremities Extremities:  No clubbing or cyanosis. Neurologic:  Alert and oriented x 3.   PPM Specifications Following MD:  Hillis Range, MD     PPM Vendor:  [employer] Jude     PPM Model Number:  [id number]     PPM Serial Number:  [id number] PPM DOI:  06/12/2008     PPM Implanting MD:  Hillis Range, MD  Lead 1    Location: [facility]     DOI: 06/12/2008     Model #: [id number]     Serial #: [id number]     Status: active Lead 2    Location: RV     DOI: 06/12/2008     Model #: [id number]     Serial #: [id number]     Status: active  Magnet Response Rate:  BOL 98.6 ERI  86.3  Indications:  A-flutter Heart block   PPM Follow Up Battery Voltage:  2.80 V     Battery Est. Longevity:  8.25-10 yrs     Pacer Dependent:  No       PPM Device Measurements Atrium  Amplitude: 5.0 mV, Impedance: 428 ohms, Threshold: 0.50 V at 0.4 msec Right Ventricle  Amplitude: 12.0 mV, Impedance: 483 ohms, Threshold: 0.750 V at 0.4 msec  Episodes MS Episodes:  5494     Percent Mode Switch:  21%     Coumadin:  Yes Ventricular High Rate:  0     Atrial Pacing:  11%     Ventricular Pacing:  92%  Parameters Mode:  DDD     Lower Rate Limit:  60     Upper Rate Limit:  120 Paced AV Delay:  200     Sensed AV Delay:  180 Next Cardiology Appt Due:  06/26/2010 Tech Comments:  5494 AMS EPISODES--21% OF TIME.  NORMAL DEVICE FUNCTION.  NO CHANGES MADE. ROV IN 6 MTHS W/DEVICE CLINIC. 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  No symptoms of ischemia.  Regular exercise encouraged  Patient Instructions: 1)  return in 6  months

## 2010-05-29 NOTE — Assessment & Plan Note (Signed)
Summary: rto 3 months.cbs   Vital Signs:  Patient profile:   70 year old male Weight:      212.2 pounds BMI:     30.56 Pulse rate:   60 / minute Resp:     15 per minute BP sitting:   120 / 64  (left arm) Cuff size:   large  Vitals Entered By: Shonna Chock CMA (April 14, 2010 9:46 AM) CC: 3 Month follow-up (copy of labs given) , Type 2 diabetes mellitus follow-up   Primary Care Provider:  Marga Melnick MD  CC:  3 Month follow-up (copy of labs given)  and Type 2 diabetes mellitus follow-up.  History of Present Illness: Type 2 Diabetes Mellitus Follow-Up      This is a 70 year old man who presents for Type 2 diabetes mellitus follow-up.  The patient reports weight loss of 4-6 # but denies polyuria, polydipsia, blurred vision, self managed hypoglycemia, and numbness of extremities.  The patient denies the following symptoms: neuropathic pain, chest pain, vomiting, orthostatic symptoms, poor wound healing, intermittent claudication, vision loss, and foot ulcer.  Since the last visit the patient reports good dietary compliance ( "with Prozac I'm sleeping better & not eating @ night").He is  not exercising regularly.  The patient has been measuring capillary blood glucose before breakfast ( averages  130) and 2 hrs  after dinner( 140-206).  Since the last visit, the patient reports having had no foot care.    Current Medications (verified): 1)  Fenofibrate Micronized 134 Mg Caps (Fenofibrate Micronized) .Marland Kitchen.. 1 By Mouth Once Daily 2)  Diovan 160 Mg Tabs (Valsartan) .Marland Kitchen.. 1daily 3)  Metformin Hcl 500 Mg Tabs (Metformin Hcl) .... 2 Bid With Largest Mealsl 4)  Warfarin Sodium 2 Mg Tabs (Warfarin Sodium) .... Take As Directed 5)  Furosemide 40 Mg  Tabs (Furosemide) .... Take 1 Tablet By Mouth As Needed 6)  Zyrtec Allergy 10 Mg  Tabs (Cetirizine Hcl) .Marland Kitchen.. 1 By Mouth Once Daily Prn 7)  Pravastatin Sodium 20 Mg  Tabs (Pravastatin Sodium) .Marland Kitchen.. 1 Qhs 8)  Cialis 20 Mg  Tabs (Tadalafil) .... Take As  Directed 9)  Fluoxetine Hcl 10 Mg Caps (Fluoxetine Hcl) .Marland Kitchen.. 1 Once Daily 10)  Onetouch Ultra Blue  Strp (Glucose Blood) .... Use As Directed  Allergies: 1)  ! * Altace 2)  ! Amoxicillin 3)  ! Tramadol Hcl (Tramadol Hcl)  Review of Systems General:  Denies fatigue and sleep disorder. Psych:  Complains of irritability; denies anxiety, depression, easily angered, and easily tearful; His mother's Alzheimer's affects his emotions , but overall he is much improved with Prozac.  Physical Exam  General:  well-nourished;alert,appropriate and cooperative throughout examination Lungs:  Normal respiratory effort, chest expands symmetrically. Lungs are clear to auscultation, no crackles or wheezes. Heart:  regular rhythm, no murmur, no gallop, no rub, and bradycardia.   Pulses:  R and L carotid,radial,dorsalis pedis and posterior tibial pulses are full and equal bilaterally Extremities:  No clubbing, cyanosis, edema. Good nail health. Neurologic:  alert & oriented X3 and sensation intact to light touch decreased over L foot.   Skin:  Intact without suspicious lesions or rashes Psych:  memory intact for recent and remote and normally interactive, very animated .     Impression & Recommendations:  Problem # 1:  DIABETES MELLITUS, TYPE II, CONTROLLED (ICD-250.00) A1c decreased from 7.2%; now controlledwith lifestyle changes & increased Metformin His updated medication list for this problem includes:    Diovan 160  Mg Tabs (Valsartan) .Marland Kitchen... 1daily    Metformin Hcl 500 Mg Tabs (Metformin hcl) .Marland Kitchen... 2 bid with largest mealsl  Problem # 2:  DEPRESSION (ICD-311) significantly improved with SSRI His updated medication list for this problem includes:    Fluoxetine Hcl 10 Mg Caps (Fluoxetine hcl) .Marland Kitchen... 1 once daily  Complete Medication List: 1)  Fenofibrate Micronized 134 Mg Caps (Fenofibrate micronized) .Marland Kitchen.. 1 by mouth once daily 2)  Diovan 160 Mg Tabs (Valsartan) .Marland Kitchen.. 1daily 3)  Metformin Hcl 500  Mg Tabs (Metformin hcl) .... 2 bid with largest mealsl 4)  Warfarin Sodium 2 Mg Tabs (Warfarin sodium) .... Take as directed 5)  Furosemide 40 Mg Tabs (Furosemide) .... Take 1 tablet by mouth as needed 6)  Zyrtec Allergy 10 Mg Tabs (Cetirizine hcl) .Marland Kitchen.. 1 by mouth once daily prn 7)  Pravastatin Sodium 20 Mg Tabs (Pravastatin sodium) .Marland Kitchen.. 1 qhs 8)  Cialis 20 Mg Tabs (Tadalafil) .... Take as directed 9)  Fluoxetine Hcl 10 Mg Caps (Fluoxetine hcl) .Marland Kitchen.. 1 once daily 10)  Onetouch Ultra Blue Strp (Glucose blood) .... Use as directed  Patient Instructions: 1)  Please schedule a follow-up appointment in 4 months. 2)  It is important that you exercise regularly at least 20 minutes 5 times a week. If you develop chest pain, have severe difficulty breathing, or feel very tired , stop exercising immediately and seek medical attention. 3)  HbgA1C prior to visit, ICD-9:250.00 4)  Urine Microalbumin prior to visit, ICD-9:250.00 5)  Check your blood sugars regularly. If your readings are usually above :150  or below 90 you should contact our office. 6)  See your eye doctor yearly to check for diabetic eye damage. 7)  Check your feet each night for sore areas, calluses or signs of infection.Portion control & avoidance of hyperglycemic white carbs as discussed Prescriptions: METFORMIN HCL 500 MG TABS (METFORMIN HCL) 2 bid with largest mealsl  #360 x 1   Entered by:   Shonna Chock CMA   Authorized by:   Marga Melnick MD   Signed by:   Shonna Chock CMA on 04/14/2010   Method used:   Faxed to ...       [facility] (mail-order)             , [place]         Ph: [phone]       Fax: [phone]   RxID:   [id number]    Orders Added: 1)  Est. Patient Level III [24401]

## 2010-05-29 NOTE — Assessment & Plan Note (Signed)
Summary: pt check//sph; labs=bun-creat-k+:995.20-hbga1c-malb: 250.02/cbs  Nurse Visit   Vital Signs:  Patient profile:   70 year old male Height:      70 inches Weight:      212 pounds Temp:     97.5 degrees F oral Pulse rate:   72 / minute BP sitting:   110 / 78  (left arm)  Vitals Entered By: Jeremy Johann CMA (April 07, 2010 8:38 AM)  Complete Medication List: 1)  Fenofibrate Micronized 134 Mg Caps (Fenofibrate micronized) .Marland Kitchen.. 1 by mouth once daily 2)  Diovan 160 Mg Tabs (Valsartan) .Marland Kitchen.. 1daily 3)  Metformin Hcl 500 Mg Tabs (Metformin hcl) .... 2 bid with largest mealsl 4)  Warfarin Sodium 2 Mg Tabs (Warfarin sodium) .... Take as directed 5)  Furosemide 40 Mg Tabs (Furosemide) .... Take 1 tablet by mouth as needed 6)  Zyrtec Allergy 10 Mg Tabs (Cetirizine hcl) .Marland Kitchen.. 1 by mouth once daily prn 7)  Pravastatin Sodium 20 Mg Tabs (Pravastatin sodium) .Marland Kitchen.. 1 qhs 8)  Cialis 20 Mg Tabs (Tadalafil) .... Take as directed 9)  Fluoxetine Hcl 10 Mg Caps (Fluoxetine hcl) .Marland Kitchen.. 1 once daily 10)  Doxycycline Hyclate 100 Mg Caps (Doxycycline hyclate) .Marland Kitchen.. 1 two times a day x 2 days then 1 once daily ; avoid sun 11)  Promethazine-codeine 6.25-10 Mg/13ml Syrp (Promethazine-codeine) .Marland Kitchen.. 1 tsp every 6 hrs as needed 12)  Onetouch Ultra Blue Strp (Glucose blood) .... Use as directed  Other Orders: Protime ([id number])   Patient Instructions: 1)  Please schedule a follow-up appointment in 1 month. 2)  Current Dose:3mg  today then 2 mg M,W,F,Sun, 1 mg T,TH,Sat  CC: PT/INR Check   Current Medications (verified): 1)  Fenofibrate Micronized 134 Mg Caps (Fenofibrate Micronized) .Marland Kitchen.. 1 By Mouth Once Daily 2)  Diovan 160 Mg Tabs (Valsartan) .Marland Kitchen.. 1daily 3)  Metformin Hcl 500 Mg Tabs (Metformin Hcl) .... 2 Bid With Largest Mealsl 4)  Warfarin Sodium 2 Mg Tabs (Warfarin Sodium) .... Take As Directed 5)  Furosemide 40 Mg  Tabs (Furosemide) .... Take 1 Tablet By Mouth As Needed 6)  Zyrtec Allergy 10 Mg   Tabs (Cetirizine Hcl) .Marland Kitchen.. 1 By Mouth Once Daily Prn 7)  Pravastatin Sodium 20 Mg  Tabs (Pravastatin Sodium) .Marland Kitchen.. 1 Qhs 8)  Cialis 20 Mg  Tabs (Tadalafil) .... Take As Directed 9)  Fluoxetine Hcl 10 Mg Caps (Fluoxetine Hcl) .Marland Kitchen.. 1 Once Daily 10)  Doxycycline Hyclate 100 Mg Caps (Doxycycline Hyclate) .Marland Kitchen.. 1 Two Times A Day X 2 Days Then 1 Once Daily ; Avoid Sun 11)  Promethazine-Codeine 6.25-10 Mg/12ml Syrp (Promethazine-Codeine) .Marland Kitchen.. 1 Tsp Every 6 Hrs As Needed 12)  Onetouch Ultra Blue  Strp (Glucose Blood) .... Use As Directed  Allergies (verified): 1)  ! * Altace 2)  ! Amoxicillin 3)  ! Tramadol Hcl (Tramadol Hcl) Laboratory Results   Blood Tests      INR: 1.8   (Normal Range: 0.88-1.12   Therap INR: 2.0-3.5)    Orders Added: 1)  Est. Patient Level I [45409] 2)  Protime [[id number]]    ANTICOAGULATION RECORD PREVIOUS REGIMEN & LAB RESULTS Anticoagulation Diagnosis:  Atrial fibrillation on  02/27/2010 Previous INR Goal Range:  2-3 on  11/11/2009 Previous INR:  2.2 on  02/27/2010 Previous Coumadin Dose(mg):  2mg  MWF and 1mg  all other days on  02/27/2010 Previous Regimen:  Same on  02/03/2010 Previous Coagulation Comments:  recheck 2 weeks on  10/13/2007  NEW REGIMEN & LAB RESULTS Current INR: 1.8 Current  Coumadin Dose(mg): 2(mg) MWF, 1(mg) all other days Regimen: 3mg  today then 2 mg MWFSun, 1 mg T,TH,Sat  Repeat testing in: 1 month  Anticoagulation Visit Questionnaire Coumadin dose missed/changed:  No Abnormal Bleeding Symptoms:  No  Any diet changes including alcohol intake, vegetables or greens since the last visit:  No Any illnesses or hospitalizations since the last visit:  No Any signs of clotting since the last visit (including chest discomfort, dizziness, shortness of breath, arm tingling, slurred speech, swelling or redness in leg):  No  MEDICATIONS FENOFIBRATE MICRONIZED 134 MG CAPS (FENOFIBRATE MICRONIZED) 1 by mouth once daily DIOVAN 160 MG TABS  (VALSARTAN) 1daily METFORMIN HCL 500 MG TABS (METFORMIN HCL) 2 bid with largest mealsl WARFARIN SODIUM 2 MG TABS (WARFARIN SODIUM) take as directed FUROSEMIDE 40 MG  TABS (FUROSEMIDE) take 1 tablet by mouth as needed ZYRTEC ALLERGY 10 MG  TABS (CETIRIZINE HCL) 1 by mouth once daily prn PRAVASTATIN SODIUM 20 MG  TABS (PRAVASTATIN SODIUM) 1 qhs CIALIS 20 MG  TABS (TADALAFIL) take as directed FLUOXETINE HCL 10 MG CAPS (FLUOXETINE HCL) 1 once daily DOXYCYCLINE HYCLATE 100 MG CAPS (DOXYCYCLINE HYCLATE) 1 two times a day X 2 days then 1 once daily ; avoid sun PROMETHAZINE-CODEINE 6.25-10 MG/5ML SYRP (PROMETHAZINE-CODEINE) 1 tsp every 6 hrs as needed ONETOUCH ULTRA BLUE  STRP (GLUCOSE BLOOD) use as directed

## 2010-05-29 NOTE — Assessment & Plan Note (Signed)
Summary: pt/cbs  Nurse Visit   Vital Signs:  Patient profile:   70 year old male Height:      70 inches (177.80 cm) Weight:      213.25 pounds (96.93 kg) BMI:     30.71 Temp:     98.0 degrees F (36.67 degrees C) oral BP sitting:   130 / 70  (left arm) Cuff size:   large  Vitals Entered By: Lucious Groves CMA (May 05, 2010 9:42 AM) CC: PT check./kb   Allergies: 1)  ! * Altace 2)  ! Amoxicillin 3)  ! Tramadol Hcl (Tramadol Hcl) Laboratory Results   Blood Tests      INR: 2.1   (Normal Range: 0.88-1.12   Therap INR: 2.0-3.5)    Orders Added: 1)  Est. Patient Level I [78295] 2)  Protime [[id number]]   ANTICOAGULATION RECORD PREVIOUS REGIMEN & LAB RESULTS Anticoagulation Diagnosis:  Atrial fibrillation on  02/27/2010 Previous INR Goal Range:  2-3 on  11/11/2009 Previous INR:  1.8 on  04/07/2010 Previous Coumadin Dose(mg):  2(mg) MWF, 1(mg) all other days on  04/07/2010 Previous Regimen:  3mg  today then 2 mg MWFSun, 1 mg T,TH,Sat on  04/07/2010 Previous Coagulation Comments:  recheck 2 weeks on  10/13/2007  NEW REGIMEN & LAB RESULTS Anticoag. Dx: Atrial fibrillation Current INR: 2.1 Current Coumadin Dose(mg): 2mg  Sun,M,W,F and 1mg  all other Regimen: same  Provider: Alwyn Ren Repeat testing in: 1 month Other Comments: Patient notes taht he did miss I dose on Sat. He is aware to continue the same and re-check in 1 month. Lucious Groves CMA  May 05, 2010 9:45 AM   Dose has been reviewed with patient or caretaker during this visit. Reviewed by: Lucious Groves  Anticoagulation Visit Questionnaire Coumadin dose missed/changed:  Yes Coumadin Dose Comments:  one or more missed dose(s) Abnormal Bleeding Symptoms:  No  Any diet changes including alcohol intake, vegetables or greens since the last visit:  No Any illnesses or hospitalizations since the last visit:  No Any signs of clotting since the last visit (including chest discomfort, dizziness, shortness of breath, arm  tingling, slurred speech, swelling or redness in leg):  No  MEDICATIONS FENOFIBRATE MICRONIZED 134 MG CAPS (FENOFIBRATE MICRONIZED) 1 by mouth once daily DIOVAN 160 MG TABS (VALSARTAN) 1daily METFORMIN HCL 500 MG TABS (METFORMIN HCL) 2 bid with largest mealsl WARFARIN SODIUM 2 MG TABS (WARFARIN SODIUM) take as directed FUROSEMIDE 40 MG  TABS (FUROSEMIDE) take 1 tablet by mouth as needed ZYRTEC ALLERGY 10 MG  TABS (CETIRIZINE HCL) 1 by mouth once daily prn PRAVASTATIN SODIUM 20 MG  TABS (PRAVASTATIN SODIUM) 1 qhs CIALIS 20 MG  TABS (TADALAFIL) take as directed FLUOXETINE HCL 10 MG CAPS (FLUOXETINE HCL) 1 once daily ONETOUCH ULTRA BLUE  STRP (GLUCOSE BLOOD) use as directed

## 2010-06-02 ENCOUNTER — Encounter: Payer: Self-pay | Admitting: Internal Medicine

## 2010-06-02 ENCOUNTER — Ambulatory Visit (INDEPENDENT_AMBULATORY_CARE_PROVIDER_SITE_OTHER): Payer: BC Managed Care – PPO

## 2010-06-02 DIAGNOSIS — Z7901 Long term (current) use of anticoagulants: Secondary | ICD-10-CM

## 2010-06-02 DIAGNOSIS — Z5181 Encounter for therapeutic drug level monitoring: Secondary | ICD-10-CM

## 2010-06-02 DIAGNOSIS — I4891 Unspecified atrial fibrillation: Secondary | ICD-10-CM

## 2010-06-02 LAB — CONVERTED CEMR LAB: INR: 2

## 2010-06-26 ENCOUNTER — Encounter: Payer: Self-pay | Admitting: Internal Medicine

## 2010-06-27 ENCOUNTER — Encounter: Payer: Self-pay | Admitting: Internal Medicine

## 2010-06-27 ENCOUNTER — Encounter (INDEPENDENT_AMBULATORY_CARE_PROVIDER_SITE_OTHER): Payer: BC Managed Care – PPO

## 2010-06-27 DIAGNOSIS — I495 Sick sinus syndrome: Secondary | ICD-10-CM

## 2010-07-03 ENCOUNTER — Ambulatory Visit (INDEPENDENT_AMBULATORY_CARE_PROVIDER_SITE_OTHER): Payer: BC Managed Care – PPO | Admitting: Internal Medicine

## 2010-07-03 ENCOUNTER — Encounter: Payer: Self-pay | Admitting: Internal Medicine

## 2010-07-03 DIAGNOSIS — Z7901 Long term (current) use of anticoagulants: Secondary | ICD-10-CM

## 2010-07-03 DIAGNOSIS — J209 Acute bronchitis, unspecified: Secondary | ICD-10-CM

## 2010-07-03 DIAGNOSIS — I4891 Unspecified atrial fibrillation: Secondary | ICD-10-CM

## 2010-07-03 DIAGNOSIS — Z5181 Encounter for therapeutic drug level monitoring: Secondary | ICD-10-CM

## 2010-07-03 LAB — CONVERTED CEMR LAB: INR: 2.2

## 2010-07-07 ENCOUNTER — Telehealth (INDEPENDENT_AMBULATORY_CARE_PROVIDER_SITE_OTHER): Payer: Self-pay | Admitting: *Deleted

## 2010-07-07 ENCOUNTER — Ambulatory Visit (INDEPENDENT_AMBULATORY_CARE_PROVIDER_SITE_OTHER): Payer: BC Managed Care – PPO

## 2010-07-07 ENCOUNTER — Encounter (INDEPENDENT_AMBULATORY_CARE_PROVIDER_SITE_OTHER): Payer: Self-pay | Admitting: *Deleted

## 2010-07-07 ENCOUNTER — Encounter: Payer: Self-pay | Admitting: Internal Medicine

## 2010-07-07 DIAGNOSIS — I441 Atrioventricular block, second degree: Secondary | ICD-10-CM

## 2010-07-07 DIAGNOSIS — Z7901 Long term (current) use of anticoagulants: Secondary | ICD-10-CM

## 2010-07-07 DIAGNOSIS — Z5181 Encounter for therapeutic drug level monitoring: Secondary | ICD-10-CM

## 2010-07-07 LAB — CONVERTED CEMR LAB: POC INR: 2.4

## 2010-07-08 NOTE — Procedures (Signed)
Summary: pacer check/st jude  [facility] moved pt per kristen/mt   Current Medications (verified): 1)  Fenofibrate Micronized 134 Mg Caps (Fenofibrate Micronized) .Marland Kitchen.. 1 By Mouth Once Daily 2)  Diovan 160 Mg Tabs (Valsartan) .Marland Kitchen.. 1daily 3)  Metformin Hcl 500 Mg Tabs (Metformin Hcl) .... 2 Bid With Largest Mealsl 4)  Warfarin Sodium 2 Mg Tabs (Warfarin Sodium) .... Take As Directed 5)  Furosemide 40 Mg  Tabs (Furosemide) .... Take 1 Tablet By Mouth As Needed 6)  Zyrtec Allergy 10 Mg  Tabs (Cetirizine Hcl) .Marland Kitchen.. 1 By Mouth Once Daily Prn 7)  Pravastatin Sodium 20 Mg  Tabs (Pravastatin Sodium) .Marland Kitchen.. 1 Qhs 8)  Cialis 20 Mg  Tabs (Tadalafil) .... Take As Directed 9)  Fluoxetine Hcl 10 Mg Caps (Fluoxetine Hcl) .Marland Kitchen.. 1 Once Daily 10)  Onetouch Ultra Blue  Strp (Glucose Blood) .... Use As Directed  Allergies (verified): 1)  ! * Altace 2)  ! Amoxicillin 3)  ! Tramadol Hcl (Tramadol Hcl)  PPM Specifications Following MD:  Hillis Range, MD     PPM Vendor:  [employer]     PPM Model Number:  [id number]     PPM Serial Number:  [id number] PPM DOI:  06/12/2008     PPM Implanting MD:  Hillis Range, MD  Lead 1    Location: [facility]     DOI: 06/12/2008     Model #: [id number]     Serial #: [id number]     Status: active Lead 2    Location: RV     DOI: 06/12/2008     Model #: [id number]     Serial #: [id number]     Status: active  Magnet Response Rate:  BOL 98.6 ERI  86.3  Indications:  A-flutter Heart block   PPM Follow Up Pacer Dependent:  No      Episodes Coumadin:  Yes  Parameters Mode:  DDD     Lower Rate Limit:  60     Upper Rate Limit:  120 Paced AV Delay:  200     Sensed AV Delay:  180 Tech Comments:  see paceart report. Vella Kohler  June 27, 2010 10:04 AM

## 2010-07-08 NOTE — Miscellaneous (Signed)
Summary: Orders Update  Clinical Lists Changes  Orders: Added new Service order of Prescription Created Electronically ([id number]) - Signed 

## 2010-07-08 NOTE — Assessment & Plan Note (Signed)
Summary: cough/cbs pt   Vital Signs:  Patient profile:   70 year old male Weight:      210.6 pounds BMI:     30.33 Temp:     97.7 degrees F oral Pulse rate:   89 / minute Resp:     16 per minute BP sitting:   104 / 60  (left arm) Cuff size:   large  Vitals Entered By: Shonna Chock CMA (July 03, 2010 1:49 PM) CC: 1.) Cough    2.) PT check, URI symptoms   Primary Care Provider:  Marga Melnick MD  CC:  1.) Cough    2.) PT check and URI symptoms.  History of Present Illness:    Onset as minimally productive cough 06/30/2010; he now  denies purulent nasal discharge, sore throat, and earache.  Associated symptoms include slight  wheezing; no PMH of asthma. He quit smoking 20 years ago.  The patient denies fever and dyspnea.  The patient denies headache.  The patient denies the following risk factors for Strep sinusitis: unilateral facial pain, tooth pain, and tender adenopathy. Note: he is on coumadin; PT/INR was 2.2 today.   Current Medications (verified): 1)  Fenofibrate Micronized 134 Mg Caps (Fenofibrate Micronized) .Marland Kitchen.. 1 By Mouth Once Daily 2)  Diovan 160 Mg Tabs (Valsartan) .Marland Kitchen.. 1daily 3)  Metformin Hcl 500 Mg Tabs (Metformin Hcl) .... 2 Bid With Largest Mealsl 4)  Warfarin Sodium 2 Mg Tabs (Warfarin Sodium) .... Take As Directed 5)  Furosemide 40 Mg  Tabs (Furosemide) .... Take 1 Tablet By Mouth As Needed 6)  Zyrtec Allergy 10 Mg  Tabs (Cetirizine Hcl) .Marland Kitchen.. 1 By Mouth Once Daily Prn 7)  Pravastatin Sodium 20 Mg  Tabs (Pravastatin Sodium) .Marland Kitchen.. 1 Qhs 8)  Cialis 20 Mg  Tabs (Tadalafil) .... Take As Directed 9)  Fluoxetine Hcl 10 Mg Caps (Fluoxetine Hcl) .Marland Kitchen.. 1 Once Daily 10)  Onetouch Ultra Blue  Strp (Glucose Blood) .... Use As Directed  Allergies: 1)  ! * Altace 2)  ! Amoxicillin 3)  ! Tramadol Hcl (Tramadol Hcl)  Physical Exam  General:  well-nourished,in no acute distress; alert,appropriate and cooperative throughout examination Ears:  External ear exam shows no  significant lesions or deformities.  Otoscopic examination reveals clear canals, tympanic membranes are intact bilaterally without bulging, retraction, inflammation or discharge. Hearing is grossly normal bilaterally. dry wax bilaterally Nose:  External nasal examination shows no deformity or inflammation. Nasal mucosa are pink and moist without lesions or exudates. Mouth:  Oral mucosa and oropharynx without lesions or exudates.  Teeth in good repair. minimal pharyngeal erythema.   Lungs:  Normal respiratory effort, chest expands symmetrically. Lungs are clear to auscultation, no  : low grade rhonchi &  wheezes. Brassy cough Cervical Nodes:  No lymphadenopathy noted Axillary Nodes:  No palpable lymphadenopathy   Impression & Recommendations:  Problem # 1:  BRONCHITIS-ACUTE (ICD-466.0)  His updated medication list for this problem includes:    Azithromycin 250 Mg Tabs (Azithromycin) .Marland Kitchen... As per pack ; check pt/inr  03/12    Hydromet 5-1.5 Mg/76ml Syrp (Hydrocodone-homatropine) .Marland Kitchen... 1 tsp every 6 hrs as needed  Complete Medication List: 1)  Fenofibrate Micronized 134 Mg Caps (Fenofibrate micronized) .Marland Kitchen.. 1 by mouth once daily 2)  Diovan 160 Mg Tabs (Valsartan) .Marland Kitchen.. 1daily 3)  Metformin Hcl 500 Mg Tabs (Metformin hcl) .... 2 bid with largest mealsl 4)  Warfarin Sodium 2 Mg Tabs (Warfarin sodium) .... Take as directed 5)  Furosemide 40 Mg  Tabs (Furosemide) .... Take 1 tablet by mouth as needed 6)  Zyrtec Allergy 10 Mg Tabs (Cetirizine hcl) .Marland Kitchen.. 1 by mouth once daily prn 7)  Pravastatin Sodium 20 Mg Tabs (Pravastatin sodium) .Marland Kitchen.. 1 qhs 8)  Cialis 20 Mg Tabs (Tadalafil) .... Take as directed 9)  Fluoxetine Hcl 10 Mg Caps (Fluoxetine hcl) .Marland Kitchen.. 1 once daily 10)  Onetouch Ultra Blue Strp (Glucose blood) .... Use as directed 11)  Azithromycin 250 Mg Tabs (Azithromycin) .... As per pack ; check pt/inr  03/12 12)  Hydromet 5-1.5 Mg/61ml Syrp (Hydrocodone-homatropine) .Marland Kitchen.. 1 tsp every 6 hrs as  needed  Other Orders: Protime ([id number])  Patient Instructions: 1)  PT /INR must be checked 03/12 as antibiotic may raise value. Use sample of Advair  as 1 inhalation every 12 hrs ; gargle & spit afteruse. Prescriptions: HYDROMET 5-1.5 MG/5ML SYRP (HYDROCODONE-HOMATROPINE) 1 tsp every 6 hrs as needed  #120cc x 0   Entered and Authorized by:   Marga Melnick MD   Signed by:   Marga Melnick MD on 07/03/2010   Method used:   Printed then faxed to ...       [id number] (retail)       [address]       [place], [place]  [address]       Ph: [phone] or [phone]       Fax: [phone]   RxID:   [id number] AZITHROMYCIN 250 MG TABS (AZITHROMYCIN) as per pack ; check PT/INR  03/12  #1 x 0   Entered and Authorized by:   Marga Melnick MD   Signed by:   Marga Melnick MD on 07/03/2010   Method used:   Electronically to        [facility] [phone]* (retail)       [address]       [place], [place]  [address]       Ph: [phone] or [phone]       Fax: [phone]   RxID:   [id number]    Orders Added: 1)  Protime [[id number]] 2)  Est. Patient Level III [26948]     ANTICOAGULATION RECORD PREVIOUS REGIMEN & LAB RESULTS Anticoagulation Diagnosis:  Atrial fibrillation on  05/05/2010 Previous INR Goal Range:  2-3 on  11/11/2009 Previous INR:  2.0 on  06/02/2010 Previous Coumadin Dose(mg):   2mg  Sun,M,W,F and 1mg  all other on  06/02/2010 Previous Regimen:  same on  06/02/2010 Previous Coagulation Comments:  recheck 2 weeks on  10/13/2007  NEW REGIMEN & LAB RESULTS Current INR: 2.2 Current Coumadin Dose(mg): 2mg  on Sun,M/W/F, all other days 1mg  Regimen: same  (no change)  Provider: Marga Melnick MEDICATIONS FENOFIBRATE MICRONIZED 134 MG CAPS (FENOFIBRATE MICRONIZED) 1 by mouth once daily DIOVAN 160 MG TABS (VALSARTAN) 1daily METFORMIN HCL 500 MG TABS (METFORMIN HCL) 2 bid with largest mealsl WARFARIN SODIUM 2 MG TABS (WARFARIN SODIUM) take as  directed FUROSEMIDE 40 MG  TABS (FUROSEMIDE) take 1 tablet by mouth as needed ZYRTEC ALLERGY 10 MG  TABS (CETIRIZINE HCL) 1 by mouth once daily prn PRAVASTATIN SODIUM 20 MG  TABS (PRAVASTATIN SODIUM) 1 qhs CIALIS 20 MG  TABS (TADALAFIL) take as directed FLUOXETINE HCL 10 MG CAPS (FLUOXETINE HCL) 1 once daily ONETOUCH ULTRA BLUE  STRP (GLUCOSE BLOOD) use as directed AZITHROMYCIN 250 MG TABS (AZITHROMYCIN) as per pack ; check PT/INR  03/12 HYDROMET 5-1.5 MG/5ML SYRP (HYDROCODONE-HOMATROPINE) 1 tsp every 6 hrs as needed   Anticoagulation Visit Questionnaire  Coumadin dose missed/changed:  No      Abnormal Bleeding Symptoms:  No   Any diet changes including alcohol intake, vegetables or greens since the last visit:  Yes      Diet Comments:More greens with in the last month Any illnesses or hospitalizations since the last visit:  No Any signs of clotting since the last visit (including chest discomfort, dizziness, shortness of breath, arm tingling, slurred speech, swelling or redness in leg):  No

## 2010-07-08 NOTE — Cardiovascular Report (Signed)
Summary: Office Visit   Office Visit   Imported By: Roderic Ovens 07/02/2010 12:46:08  _____________________________________________________________________  External Attachment:    Type:   Image     Comment:   External Document

## 2010-07-15 NOTE — Medication Information (Signed)
Summary: pt/cbs  Anticoagulant Therapy  Managed by: Oren Bracket PCP: Marga Melnick MD Indication 1: Atrial fibrillation INR POC 2.4 INR RANGE 2-3  Vital Signs: Weight: 208 lbs.  Blood Pressure:  116 / 60   Dietary changes: no    Health status changes: no    Bleeding/hemorrhagic complications: no    Recent/future hospitalizations: no    Any changes in medication regimen? yes       Details: has 1 day of atb left   Recent/future dental: no  Any missed doses?: no       Is patient compliant with meds? yes      Comments: current dose: 2mg  on Sun,M,W,F all other days 1mg   advised no change 4 weeks .Marland KitchenMarland KitchenMarland KitchenDoristine Devoid CMA  July 07, 2010 9:35 AM   Allergies: 1)  ! * Altace 2)  ! Amoxicillin 3)  ! Tramadol Hcl (Tramadol Hcl)  Anticoagulation Management History:      Positive risk factors for bleeding include an age of 68 years or older and presence of serious comorbidities.  Negative risk factors for bleeding include no history of CVA/TIA.  The bleeding index is 'intermediate risk'.  Positive CHADS2 values include History of HTN and History of Diabetes.  Negative CHADS2 values include Age > 59 years old and Prior Stroke/CVA/TIA.  His last INR was 2.2.  INR POC: 2.4.    Anticoagulation Management Assessment/Plan:      The patient's current anticoagulation dose is Warfarin sodium 2 mg tabs: take as directed.  The next INR is due 4 weeks.  Results were reviewed/authorized by Oren Bracket.

## 2010-07-15 NOTE — Progress Notes (Signed)
Summary: REFILL  Phone Note Refill Request Call back at [phone] Message from:  Pharmacy on July 07, 2010 12:36 PM  Refills Requested: Medication #1:  PROMETH/CODERINE SYR 120.0 ML-TAKE 1 TEASPOONFUL BY MOUTH EVERY 6 HOURS AS NEEDED   Dosage confirmed as above?Dosage Confirmed   Supply Requested: 1 month [facility] [address]  Initial call taken by: Lavell Islam,  July 07, 2010 12:37 PM  Follow-up for Phone Call        Dr.Hopper please advise , cough med was rx'ed on 07/03/10 (4 days ago)  Follow-up by: Shonna Chock CMA,  July 07, 2010 3:21 PM  Additional Follow-up for Phone Call Additional follow up Details #1::        should last until  3/14; 5 cc every 6 hrs as needed  Additional Follow-up by: Marga Melnick MD,  July 07, 2010 4:47 PM    Additional Follow-up for Phone Call Additional follow up Details #2::    Left message on voicemail informing patient of Dr.Hopper's response .Shonna Chock CMA  July 07, 2010 5:00 PM

## 2010-07-15 NOTE — Letter (Signed)
Summary: Primary Care Appointment Letter  [facility]  [address] [place], [place] [address]   Phone: [phone]  Fax: [phone]    07/07/2010 MRN: [id number]  [facility] [address] [place], [place]  [address]  [place]  Dear Albert Hawkins,   Your Primary Care Physician Albert Melnick MD has indicated that:    _______it is time to schedule an appointment.    _______you missed your appointment on______ and need to call and          reschedule.    _____X__you need to have lab work done(PLEASE CALL with in the next 90days to schedule FASTING lab appointment to f/u on Chlosterol).    _______you need to schedule an appointment discuss lab or test results.    _______you need to call to reschedule your appointment that is                       scheduled on _________.     Please call our office as soon as possible. Our phone number is [phone]. Please press option 1. Our office is open 8a-5p, Monday through Friday.     Thank you,    [facility]uler

## 2010-07-22 NOTE — Progress Notes (Signed)
Addended by: Army Fossa on: 07/22/2010 09:38 AM   Modules accepted: Orders

## 2010-08-11 ENCOUNTER — Other Ambulatory Visit: Payer: Self-pay | Admitting: Internal Medicine

## 2010-08-11 ENCOUNTER — Other Ambulatory Visit: Payer: Self-pay

## 2010-08-11 ENCOUNTER — Ambulatory Visit (INDEPENDENT_AMBULATORY_CARE_PROVIDER_SITE_OTHER): Payer: BC Managed Care – PPO | Admitting: *Deleted

## 2010-08-11 VITALS — BP 108/62 | HR 74 | Wt 207.0 lb

## 2010-08-11 DIAGNOSIS — I441 Atrioventricular block, second degree: Secondary | ICD-10-CM

## 2010-08-11 DIAGNOSIS — Z7901 Long term (current) use of anticoagulants: Secondary | ICD-10-CM

## 2010-08-11 DIAGNOSIS — E119 Type 2 diabetes mellitus without complications: Secondary | ICD-10-CM

## 2010-08-11 NOTE — Patient Instructions (Signed)
Pt advised no change recheck 4 weeks  

## 2010-08-12 LAB — GLUCOSE, CAPILLARY
Glucose-Capillary: 100 mg/dL — ABNORMAL HIGH (ref 70–99)
Glucose-Capillary: 99 mg/dL (ref 70–99)

## 2010-08-12 LAB — PROTIME-INR
INR: 2.8 — ABNORMAL HIGH (ref 0.00–1.49)
Prothrombin Time: 31.6 seconds — ABNORMAL HIGH (ref 11.6–15.2)

## 2010-08-18 ENCOUNTER — Encounter: Payer: Self-pay | Admitting: Internal Medicine

## 2010-08-18 ENCOUNTER — Ambulatory Visit (INDEPENDENT_AMBULATORY_CARE_PROVIDER_SITE_OTHER): Payer: BC Managed Care – PPO | Admitting: Internal Medicine

## 2010-08-18 DIAGNOSIS — F329 Major depressive disorder, single episode, unspecified: Secondary | ICD-10-CM

## 2010-08-18 MED ORDER — GLIMEPIRIDE 2 MG PO TABS: 2.0000 mg | ORAL_TABLET | ORAL | Status: DC

## 2010-08-18 NOTE — Progress Notes (Signed)
Subjective:    Patient ID: Albert Hawkins, male    DOB: 20-May-1940, 70 y.o.   MRN: [id number]  HPI   Diabetes status assessment :Fasting or morning glucose range:  Rarely checked  or average :   Not calculated    ;   Highest glucose 2 hours after any meal:  ? 196 ;  Hypoglycemia :  no  .                                                           Excess thirst :no;  Excess hunger:  no ;  Excess urination:  no .                                            Lightheadedness with standing:  no (he is in AF "25%" of time) ; Chest pain:  no ; Palpitations :AF not felt except for fatigue ;  Pain in  calves with walking:  no .                                                                                                                                                              Non healing skin  ulcers or sores,especially over the feet:  no ; Numbness or tingling or burning in feet : minor LLE.                                                                                                                                                 Significant change in  Weight : down 5#  ;Vision changes : occasional blurring  Exercise : no ;  Nutrition/diet:  See comment below ("I crave chocolate & candy... Increased sodas");   Medication compliance : yes ; Medication adverse  Effects:  no  ;  Eye exam : 02/2010, no retinopathy ; Foot care : no ;  A1c/ urine microalbumin monitor:  7.4%( average sugar 166, risk 485).A1c was 6.9% in 12/11(151,38%)      Review of Systems He has been on fluoxetine since late  2011 due to  family stressors. His mother had food bolus  airway obstruction  and was hospitalized for almost 2 months. Also his daughter's been going through a divorce.  He believes these  stresses have led to him "eating comfort food" with subsequent poor diabetic control (see  A1c)  He describes some depression; he denies anxiety. 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 Arthritis strength Tylenol has helped the shoulder symptoms.     Objective:   Physical Exam Gen.: Healthy and well-nourished in appearance. Alert, appropriate and cooperative throughout exam.Open & honest about non adherence Neck: No deformities, masses, or tenderness noted. Range of motion  normal. Thyroid  :no nodules. Lungs: Normal respiratory effort; chest expands symmetrically. Lungs are clear to auscultation without rales, wheezes, or increased work of breathing. Heart: Slow rate and rhythm. Normal S1 and S2. No gallop, click, or rub. No murmur. Abdomen: Bowel sounds normal; abdomen soft and nontender. No masses, organomegaly or hernias noted. No AAA  Musculoskeletal/extremities:  No clubbing, cyanosis, edema, or deformity noted. Range of motion  normal .Tone & strength  normal.Joints normal. Nail health ; post traumatic hyperpigmentation of great nails.Pes planus. Crepitus of knees Vascular: Carotid, radial artery, and dorsalis posterior tibial pulses are full and equal. No bruits present.decreased DPP Neurologic: Alert and oriented x3. Deep tendon reflexes symmetrical and normal. Light touch normal over feet        Skin: Intact without suspicious lesions or rashes. Lymph: No cervical, axillary, or inguinal lymphadenopathy present. Psych: Mood and affect are normal. Normally interactive                                                                                         Assessment & Plan:  #1 diabetes mellitus, poor control due to nonadherence with nutrition and exercise  #2 depression, situational. This is improved with the fluoxetine.  Plan: #1  Glimiperide 2 mg will be added daily to the metformin. A1c will be rechecked in 4 months. Risks were discussed.

## 2010-08-18 NOTE — Patient Instructions (Signed)
Exercise at least 30-45 minutes a day,  3-4 days a week.  Eat a low-fat diet with lots of fruits and vegetables, up to 7-9 servings per day. Avoid obesity; your goal is waist measurement < 40 inches.Consume less than 40 grams of sugar per day from foods & drinks with High Fructose Corn Sugar as #2,3 or # 4 on label.The New Sugar Busters is a good guide.Eye Doctor - have an eye exam @ least annually.                                                                                                   Diabetes Monitor   The A1c test checks the average amount of glucose (sugar) in the blood over the last 2 to 3 months.As glucose circulates in the blood, some of it binds to hemoglobin A. This is the main form of hemoglobin in adults. Hemoglobin is a red protein that carries oxygen in the red blood cells (RBC's). Once the glucose is bound to the hemoglobin A, it remains there for the life of the red blood cell (about 120 days). This combination of glucose and hemoglobin A is called A1c (or hemoglobin A1c or glycohemoglobin). Increased glucose in the blood, increases the hemoglobin A1c. A1c levels do not change quickly but will shift as older RBC's die and younger ones take their place.   The A1c test is used primarily to monitor the glucose control of diabetics over time. The goal of those with diabetes is to keep their blood glucose levels as close to normal as possible. This helps to minimize the complications caused by chronically elevated glucose levels, such as progressive damage to body organs like the kidneys, eyes, cardiovascular system, and nerves. The A1c test gives a picture of the average amount of glucose in the blood over the last few months. It can help a patient and his doctor know if the measures they are taking to control the patient's diabetes are successful or need to be adjusted.     Depending on the type of diabetes that you have, how well your diabetes is controlled, your A1c may be measured 2  to 4 times each year. When someone is first diagnosed with diabetes or if control is not good, A1c may be ordered more frequently.   NORMAL VALUES  Non diabetic adults: 5 %-6.1%  Good diabetic control: 6.2-6.4 %  Fair diabetic control: 6.5-7%  Poor diabetic control: greater than 7 % ( except with additional factors such as  advanced age; significant coronary or neurologic disease,etc). Check the A1c every 6 months if it is < 6.5%; every 4 months if  6.5% or higher. Goals for home glucose monitoring are : fasting  or morning glucose goal of  90-150. Two hours after any meal , goal = < 180, preferably < 150. A1c in 4 months (250.02)

## 2010-09-08 ENCOUNTER — Ambulatory Visit (INDEPENDENT_AMBULATORY_CARE_PROVIDER_SITE_OTHER): Payer: BC Managed Care – PPO | Admitting: *Deleted

## 2010-09-08 DIAGNOSIS — I4430 Unspecified atrioventricular block: Secondary | ICD-10-CM

## 2010-09-08 DIAGNOSIS — Z7901 Long term (current) use of anticoagulants: Secondary | ICD-10-CM

## 2010-09-08 LAB — POCT INR: INR: 2.1

## 2010-09-08 NOTE — Patient Instructions (Signed)
Pt instructed no change recheck 4 weeks 

## 2010-09-09 ENCOUNTER — Telehealth: Payer: Self-pay

## 2010-09-09 NOTE — Telephone Encounter (Signed)
Decrease the Coumadin nightly dose by one half of present dosage and monitor sugars. Goals for home glucose monitoring are : fasting  or morning glucose goal of  90-150. Two hours after any meal , goal = < 180, preferably < 150.

## 2010-09-09 NOTE — Assessment & Plan Note (Signed)
[facility]                            CARDIOLOGY OFFICE NOTE   ASER, NYLUND                       MRN:          [id number]  DATE:11/28/2007                            DOB:          April 21, 1941    PRIMARY CARE PHYSICIAN:  Dr. Alwyn Ren.   REASON FOR PRESENTATION:  Evaluate the patient with atrial fibrillation  and flutter.   HISTORY OF PRESENT ILLNESS:  The patient is 70 years old.  At the last  visit, I had him wear a monitor for 24 hours.  I found him to be in  atrial flutter at the time of that visit.  The monitor demonstrated  atrial flutter.  He also had sinus rhythm with 2:1 AV block and Mobitz  type II block.  He has ventricular bigeminy and ventricular couplets.  I  brought him back today to discuss this.  Though he notice his pulse to  be low at times, he has not had any presyncope or syncope.  He cannot  tell when it is irregular.  He says he has been a little more fatigued  than he recalls, but he ascribed this to getting older.  He has had no  chest pain or shortness of breath.  He maintains Coumadin and other  medicines as listed below.   PAST MEDICAL HISTORY:  Paroxysmal atrial fibrillation/flutter, diabetes  mellitus, hypertension, dyslipidemia, obesity, skin cancer resected, and  umbilical hernia repair.   ALLERGIES:  None.   MEDICATIONS:  1. Tricor 145 mg daily.  2. Diovan 160 mg daily.  3. Metoprolol 25 mg daily.  4. Actos 30 mg daily.  5. Metformin 500 mg b.i.d.  6. Coumadin.  7. Pravastatin 20 mg daily.   REVIEW OF SYSTEMS:  As stated in the HPI and otherwise negative for  other systems.   PHYSICAL EXAMINATION:  GENERAL:  The patient is in no distress.  VITAL SIGNS:  Blood pressure 135/84, heart rate 41 and slightly  irregular, weight 239 pounds, and body mass index 34.  HEENT:  Eyes are unremarkable.  Pupils are equal, round, and reactive to  light.  Fundi not visualized.  Oral mucosa unremarkable.  NECK:  No  jugular venous distention at 45 degrees.  Carotid upstroke  brisk and symmetrical.  No bruits.  No thyromegaly.  LYMPHATICS:  No cervical, axillary, or inguinal adenopathy.  LUNGS:  Clear to auscultation bilaterally.  BACK:  No costovertebral angle tenderness.  CHEST:  Unremarkable.  HEART:  PMI not displaced or sustained.  S1 and S2 within normal limits.  No S3.  No murmurs.  ABDOMEN:  Flat, positive bowel sounds.  Normal in frequency and pitch.  No bruits, no rebound, and no guarding.  No midline pulsatile mass.  No  hepatomegaly.  No splenomegaly.  SKIN:  No rashes, no nodules.  EXTREMITIES:  2+ pulses throughout.  No edema, no cyanosis, and no  clubbing.  NEUROLOGIC:  Oriented to person, place, and time.  Cranial nerves II-XII  grossly intact.  Motor grossly intact.   EKG; atrial fibrillation with slow ventricular response, axis within  normals, intervals  within normal limits, and no acute ST-wave changes.   ASSESSMENT AND PLAN:  1. 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  I      will then discontinue the beta-blocker.  However, I now see      fibrillation and with these mixed dysrhythmias, flutter ablation      would not be indicated.  He has a slow fibrillation response.      Given his brady arrhythmias when in sinus, I am going to      discontinue his metoprolol altogether.  I will need about 6 weeks.      I will apply a 48-hour monitor to see if I can get an idea,      frequently he is having these arrhythmias, how slow he might go.      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  We had a long discussion about      this in the office (greater than half hour this appointment).  2. Hypertension.  Blood pressure is well controlled, and he will      continue the medications as listed.  3. Coumadin.  The patient does have [place]  score of 2 and we will      continue on anticoagulation.  He understands the risks and benefits      of this.  4. Diabetes, per his primary care physician.  5. Dyslipidemia.  Given his risk factors, I would suggest an LDL less      than 100 and HDL greater than 40 as his goals.  6. Followup as above.     Rollene Rotunda, MD, [facility]  Electronically Signed    JH/MedQ  DD: 11/28/2007  DT: 11/29/2007  Job #: [id number]   cc:   Titus Dubin. Alwyn Ren, MD,FACP,FCCP

## 2010-09-09 NOTE — Assessment & Plan Note (Signed)
Albert Hawkins                            CARDIOLOGY OFFICE NOTE   Albert Hawkins, Albert Hawkins                       MRN:          [id number]  DATE:09/16/2006                            DOB:          13-Dec-1940    PRIMARY CARE PHYSICIAN:  Dr. Alwyn Ren.   REASON FOR PRESENTATION:  Evaluate patient with atrial  fibrillation/flutter.   HISTORY OF PRESENT ILLNESS:  The patient is a 70 year old gentleman with  a past history of paroxysmal atrial flutter.  He was in this rhythm in  June of 2007.  When I last saw him he was in sinus rhythm.  He does not  notice any atrial arrhythmias.  Today his EKG is a slow atrial  fibrillation.  He is not noticing any palpitations.  He has no  presyncope or syncope.  He has a normal energy level.  He denies any  shortness of breath, PND, or orthopnea.  He has had no chest discomfort,  neck or arm discomfort.  He continues his Coumadin and has this followed  regularly by Dr. Alwyn Ren.   PAST MEDICAL HISTORY:  1. Diabetes mellitus.  2. Hypertension.  3. Dyslipidemia.  4. Skin cancer, resected.  5. Umbilical hernia repair.   ALLERGIES:  None.   MEDICATIONS:  1. Tricor 145 mg daily.  2. Diovan 160 mg daily.  3. Metoprolol 25 mg daily.  4. Actos 30 mg daily.  5. Metformin 500 mg b.i.d.  6. Coumadin.   REVIEW OF SYSTEMS:  As stated in the HPI and otherwise negative for  other systems.   PHYSICAL EXAMINATION:  Patient is in no distress.  Blood pressure 142/72, heart rate 50 and irregular.  HEENT:  Eyelids unremarkable, pupils are equal, round, and reactive to  light, fundi not visualized.  Oral mucosa unremarkable.  NECK:  No jugular venous distension, wave form within normal limits,  carotid upstroke brisk and symmetrical.  No bruit.  No thyromegaly.  LYMPHATICS:  No cervical, axillary, inguinal adenopathy.  LUNGS:  Clear to auscultation bilaterally.  BACK:  No costovertebral angle tenderness.  CHEST:  Unremarkable.  HEART:  PMI not displaced or sustained.  S1 and S2 are within normal  limits.  No S3, no murmurs.  ABDOMEN:  Obese, positive bowel sounds, normal in frequency and pitch.  No bruits, no rebound, no guarding.  No midline pulsatile mass.  No  organomegaly.  SKIN:  No rashes, no nodules.  EXTREMITIES:  2+ pulses, trace bilateral lower extremity edema.  NEURO:  Oriented to person, place, and time.  Cranial nerves II-XII  grossly intact, motor grossly intact.   EKG:  Atrial fibrillation with slow ventricular response, axis within  normal limits, intervals within normal limits.  No acute ST-wave  changes.   ASSESSMENT AND PLAN:  1. Atrial fibrillation.  We had a long discussion about this rhythm.      He obviously has paroxysms of this, but he is asymptomatic.  He      does have enough risks to be on the Coumadin, and we will maintain      this.  He will  maintain metoprolol.  He will let me know if he has      any tachy palpitations or symptoms consistent with brady      arrhythmias.  No further evaluation is warranted at this point.  2. Obesity.  Again, had a discussion with the need to lose weight with      diet and exercise.  Hopefully, he can comply in the future.  3. Hypertension.  Blood pressure is slightly elevated here; however,      he reports that it is well controlled elsewhere.  He should keep a      blood pressure diary and share this with Dr. Alwyn Ren.  He can remain      on the other medications as listed.  4. Risk reduction.  We talked about the important need for risk      reduction given his risk factors.  He had a stress test in 2005 and      does not need one yet.  I will consider one in a year.  5. Followup.  I will see him back in 12 months or sooner if needed.     Rollene Rotunda, MD, [facility]  Electronically Signed    JH/MedQ  DD: 09/16/2006  DT: 09/16/2006  Job #: [id number]   cc:   Titus Dubin. Alwyn Ren, MD,FACP,FCCP

## 2010-09-09 NOTE — Telephone Encounter (Signed)
Pls readvise I don't believe you meant coumadin.

## 2010-09-09 NOTE — Discharge Summary (Signed)
NAME:  COYT, GOVONI NO.:  [id number]   MEDICAL RECORD NO.:  [id number]          PATIENT TYPE:  OIB   LOCATION:  2011                         FACILITY:  [facility]   PHYSICIAN:  Hillis Range, MD       DATE OF BIRTH:  07-Nov-1940   DATE OF ADMISSION:  06/12/2008  DATE OF DISCHARGE:  06/13/2008                               DISCHARGE SUMMARY   TIME OF THIS DICTATION:  Greater than 35 minutes includes examination  and explanation of follow up to the patient.   FINAL DIAGNOSES:  1. Atrial flutter  2. Symptomatic bradycardia  3. Mobitz I second degree AV block   SECONDARY DIAGNOSES:  1. Diabetes.  2. Hypertension.  3. Dyslipidemia.  4. Obesity.   PROCEDURE:  1. June 12, 2008 implant of the [employer] Zephyr dual-chamber      pacemaker for symptomatic bradycardia.  2. The same day, June 12, 2008 electrophysiology study      radiofrequency catheter ablation of a cavotricuspid isthmus-      dependent counterclockwise atrial flutter with creation of      bidirectional cavotricuspid block.  No inducible arrhythmias.      After the procedure, the patient did have a history of Mobitz I      second-degree AV block.  No apparent complications at post      procedure.   BRIEF HISTORY:  Mr. Carnell is a 70 year old male.  He has a history of  atrial flutter and bradycardia.  He has not had any presyncopal or  syncopal episodes.  He had a Holter monitor placed by Dr. Antoine Poche on  September 2009.  The study showed predominant sinus bradycardia and a  pause the longest of which was 3.1 seconds.  He at that time was  referred to Dr. Johney Frame, electrophysiologist for pacemaker and atrial  flutter ablation.  He reports symptoms of fatigue and decreased exercise  capacity.  He therefore presents for atrial flutter ablation and  pacemaker implantation.   HOSPITAL COURSE:  The patient presents electively on June 12, 2008.  He underwent a dual procedure as described above by  Dr. Johney Frame.  He  underwent successful Cavotricuspid isthmus ablation during which atrial  flutter terminated.  Complete bidirectional isthmus block was achieved.  He was then observed to have mobitz I second degree AV block with heart  rates in the 30s.  He therefore underwent successful dual chamber  pacemaker implantation as planned.  He remained in sinus rhythm for the  duration of his hospital stay.  At time of discharge, the patient was  alert, ambulatory, and otherwise without complaint.   The patient discharges on the following medication.  1. TriCor 145 mg daily.  2. Diovan 160 mg daily.  3. Actos 30 mg daily.  4. Metformin 500 mg twice daily.  5. Pravastatin 20 mg daily at bedtime.  6. Lasix 40 mg as needed.  7. Zyrtec 10 mg as needed.  8. Coumadin 1 mg Tuesday and Thursday, 2 mg all other days.  He is      given a prescription for Percocet  5/325 1-2 tablets every 4-6 hours      for incisional pain.   He has follow up:  With Dr. Alwyn Ren at his office on Monday June 18, 2008 for protime at  10:45.  He also will report to [facility], [address] on Thursday, July 12, 2008 at 9:20.  He sees Dr.  Johney Frame for a post ablation visit on Thursday July 19, 2008 at 9:45 and  he will see Dr. Johney Frame again in 3 months  for re-interrogation of his  device.  Dr. Jenel Lucks office will call with that appointment.   LABORATORY STUDIES:  Pertinent to this admission were obtained on  June 08, 2008.  Complete blood count white cells 4.5, hemoglobin  13.8, hematocrit 39.3 and platelets are way down below here 149, protime  26.7, INR 2.6.  Sodium 139, potassium 4.2, chloride 101, carbonate 28,  glucose 105, BUN is 22, and creatinine 1.5.      Maple Mirza, [place]      Hillis Range, MD  Electronically Signed    GM/MEDQ  D:  06/13/2008  T:  06/13/2008  Job:  [id number]   cc:   Titus Dubin. Alwyn Ren, MD,FACP,FCCP  Rollene Rotunda, MD, [facility]

## 2010-09-09 NOTE — Assessment & Plan Note (Signed)
[facility]                            CARDIOLOGY OFFICE NOTE   CHRISTOHPER, DUBE                       MRN:          [id number]  DATE:10/07/2007                            DOB:          Feb 09, 1941    PRIMARY CARE PHYSICIAN:  Titus Dubin. Alwyn Ren, MD, FACP, FCCP   REASON FOR PRESENTATION:  Evaluate patient with fibrillation and  flutter.   HISTORY OF PRESENT ILLNESS:  The patient is 70 years old.  He had a past  history of paroxysmal atrial fibrillation and flutter.  Today, he is in  atrial flutter.  Last year,  it was fibrillation.  He had had paroxysms  of flutter before that.  He does not notice palpitations.  He knows that  his heart rate is low, but it does not bother him.  He does not describe  any presyncope or syncope.  He does not describe any skipping or strong  heartbeats.  He says he has a slowly reduced exercise tolerance, but he  has not been as active as he used to be, and he has gained some weight.  He denies any shortness of breath.  He has had no PND or orthopnea.  He  has had no chest discomfort, neck or arm discomfort.   PAST MEDICAL HISTORY:  1. Paroxysmal atrial fibrillation/flutter.  2. Diabetes mellitus.  3. Hypertension.  4. Dyslipidemia.  5. Obesity.  6. Skin cancer resected.  7. Umbilical hernia repair.   ALLERGIES:  None.   MEDICATIONS:  1. TriCor 145 mg daily.  2. Diovan 160 mg daily.  3. Metoprolol 25 mg daily.  4. Actos 30 mg daily.  5. Metformin 500 mg b.i.d.  6. Coumadin.  7. Pravastatin 20 mg daily.   REVIEW OF SYSTEMS:  As stated in the HPI and otherwise negative for  other systems.   PHYSICAL EXAMINATION:  The patient is in no distress.  Blood pressure  121/68, heart rate 58 and regular, and weight 248 pounds.  HEENT:  Eyelids are unremarkable.  Pupils are equal, round, and reactive  to light.  Fundi not visualized.  Oral mucosa unremarkable.  NECK:  No jugular venous distention at 45 degrees.   Carotid upstroke  brisk and symmetrical.  No bruits.  No thyromegaly.  LYMPHATICS:  No cervical, axillary, or inguinal adenopathy.  LUNGS:  Clear to auscultation bilaterally.  BACK:  No costovertebral angle tenderness.  CHEST:  Unremarkable.  HEART:  PMI not displaced or sustained.  S1 and S2 within normal limits.  No S3.  No S4.  No clicks.  No rubs.  No murmurs.  ABDOMEN:  Obese.  Positive bowel sounds.  Normal frequency and pitch.  No bruits.  No rebound.  No guarding.  No midline pulsatile mass.  No  hepatomegaly.  No splenomegaly.  SKIN:  No rashes.  No nodules.  EXTREMITIES:  Pulse 2+.  Moderate bilateral lower extremity edema.  NEUROLOGIC:  Oriented to person, place, and time.  Cranial nerves II  through XII grossly intact.  Motor grossly intact.   EKG, atrial flutter with slow ventricular conduction and premature  ventricular contractions.  Axis within normal limits.  Intervals within  normal limits.  No acute ST-wave change.   1. Atrial flutter/fibrillation.  I am going to have a 24-hour monitor      placed to see if he is predominantly in flutter and/or permanently      in one of these rhythms, fib or flutter.  I am also going to look      at his ventricular rate and make sure there is no significant      bradyarrhythmias, which would necessitate change to his meds.  He      says at this point that he probably would not consider atrial      flutter ablation.  He has a CHADS score of 2, and I think he does      need the anticoagulation though it is a gray zone.  He will      continue with the Coumadin.  2. Hypertension.  Blood pressure is well controlled and he will      continue medications as listed.  3. Obesity.  We had a long discussion about the need to exercise and      lose weight.  4. Diabetes per Dr. Alwyn Ren.  5. Dyslipidemia per Dr. Alwyn Ren with a goal LDL less than 100 and HDL      greater than 40.  6. Lower extremity edema.  The patient has some mild ankle  edema,      which he has episodically.  I suspect this is dietary indiscretion      and also related to his weight.  He takes a little p.r.n. Lasix and      it clears up quickly.  We reviewed this and he seems to be treating      this appropriately.  He needs to avoid salt, keep his feet elevated      when seated, and lose weight.  7. Followup.  I would see him back in 6 months to re-discuss this      issue or sooner, if he has any problems.  I will follow up the      results of the Holter as well.     Rollene Rotunda, MD, [facility]  Electronically Signed    JH/MedQ  DD: 10/07/2007  DT: 10/08/2007  Job #: [id number]   cc:   Titus Dubin. Alwyn Ren, MD,FACP,FCCP

## 2010-09-09 NOTE — Letter (Signed)
February 02, 2008    Albert Rotunda, MD, [facility]  [address]  [address]  [place], [place] [address]   RE:  Albert Hawkins  MRN:  [id number]  /  DOB:  1940/07/28   Dear Albert Hawkins,   It is my pleasure to see your patient, Albert Hawkins in EP consultation  today regarding therapeutic strategies for atrial fibrillation, atrial  flutter, and AV block.  As you recall, Albert Hawkins is a very pleasant 70-  year-old gentleman with a history of paroxysmal atrial fibrillation and  atrial flutter.  He also has a history of diabetes and hypertension.  He  reports doing rather well recently.  He reports playing golf frequently  without symptoms.  He is primarily limited by a prior left ankle injury  and occasional fatigue with moderate activities.  He denies chest  discomfort, shortness of breath, palpitations, dizziness, presyncope, or  syncope and is otherwise doing well.  Upon being evaluated in your  clinic recently for atrial fibrillation and atrial flutter, a 24-hour  Holter monitor was obtained in June.  This revealed sinus rhythm with  paroxysmal atrial flutter.  The patient was also found to have episodes  of 2-1 AV block, which appear to be primarily Mobitz type 1.  He  subsequently returned and had a 48-hour monitor placed on January 04, 2007.  This monitor recorded predominantly typical-appearing atrial  flutter with variable ventricular rates.  The patient was found to have  bradycardic episodes with heart rates in the 30s.  The heart rate range  was 33-79 with an average of 44 beats per minute.  Despite these  recordings, the patient adamantly denied symptoms of presyncope or  syncope, and otherwise reports good health.  Upon discovering these  findings, you appropriately discontinued the patient's metoprolol, and  he is currently on no rate-controlling medications.  He is indeed  anticoagulated with Coumadin.  He is tolerating this medicine well and  otherwise without complaint.   PAST  MEDICAL HISTORY:  1. Paroxysmal atrial fibrillation and isthmus-dependent right atrial      flutter (as above).  2. Mobitz I AV block.  3. Diabetes mellitus.  4. Hypertension.  5. Hyperlipidemia.  6. Obesity.  7. Skin cancer resected.  8. Umbilical hernia.   ALLERGIES:  None.   CURRENT MEDICATIONS:  1. TriCor  145 mg daily.  2. Diovan 160 mg daily.  3. Actos 30 mg daily.  4. Metformin 500 mg b.i.d.  5. Coumadin to maintain an INR between 2 and 3.  6. Pravastatin 20 mg daily.   SOCIAL HISTORY:  The patient lives in [place].  He plays golf  frequently and is quite active.  He smoked 3 packs per day, but quit in  1987.  He denies alcohol or drug use.   FAMILY HISTORY:  The patient's mother had atrial fibrillation in her  60s.  The patient's father died of lung cancer.  He has no siblings.   REVIEW OF SYSTEMS:  All systems are reviewed and negative except as  outlined in the HPI above.   PHYSICAL EXAMINATION:  VITALS:  Blood pressure 164/90, heart rate 54,  respirations 18, weight 240 pounds.  GENERAL:  The patient is a well-appearing male in no acute distress.  He  is vibrant, alert, and oriented x3.  HEENT:  Normocephalic, atraumatic.  Sclerae clear.  Conjunctivae pink.  Oropharynx clear.  NECK:  Supple.  No JVD, lymphadenopathy, or bruits.  LUNGS:  Clear to auscultation bilaterally.  HEART:  Regular rate and rhythm.  No murmurs, rubs, or gallops.  GI:  Soft, nontender, and nondistended.  Positive bowel sounds.  EXTREMITIES:  No clubbing, cyanosis, or edema.  NEUROLOGIC:  Strength and sensation are intact.  SKIN:  No ecchymosis or lacerations.  MUSCULOSKELETAL:  No deformity or atrophy.  PSYCH:  Euthymic mood.  Full affect.   EKG, counterclockwise right atrial flutter with a variable ventricular  rate including 4:1 and 8:1 AV conduction.   TSH from January 16, 2008, is normal.   IMPRESSION:  Albert Hawkins is a very pleasant 70 year old gentleman with a  history of  hypertension, diabetes, atrial fibrillation, and atrial  flutter.  Upon review of the patient's recent Holter findings, his  predominant rhythm appears to be atrial flutter.  I actually did not see  atrial fibrillation documented on either of these.  When in sinus  rhythm, the patient appears to have Mobitz I atrioventricular block and  frequent 2:1 AV conduction.  His EKG today reveals 4:1 and 8:1  conduction of atrial flutter.  Surprisingly, the patient is adamant that  he has no symptoms of bradycardia, presyncope, or syncope.  He is  chronically anticoagulated with Coumadin and tolerating this medication  well.   PLAN:  Therapeutic strategies for atrial fibrillation, atrial flutter,  and Mobitz I AV block were discussed in detail with the patient today.  He is aware that the most important strategy should be chronic  anticoagulation with Coumadin given the patient's CHADS score of 2.  Review of the patient's monitors as discussed above revealed  predominantly atrial flutter.  I therefore think that if the patient  were symptomatic, it will be very reasonable to proceed with EP study  and radiofrequency ablation for atrial flutter alone.  I would prefer to  watch the patient thereafter to see if we could control his atrial  fibrillation with medications rather than proceeding directly to  catheter ablation for atrial fibrillation as this procedure does carry  some increased risk.  As I discussed with the patient, my primary  concern is for the patient's AV block.  Presently, he is asymptomatic  with his Mobitz I AV block.  I am concerned that if we were to proceed  with catheter ablation for atrial fibrillation that upon conversion to  sinus rhythm he may find himself more symptomatic with his bradycardia  and would likely require a pacemaker to be implanted.  I have taken the  liberty today to discuss the risks, benefits, and alternatives to  pacemaker implantation.  Nevertheless,  the patient is adamant at this  time that he is completely asymptomatic and that he would like to be  followed conservatively with observation alone and continued  anticoagulation with Coumadin.  I think this is probably a reasonable  strategy.  I have informed the patient that should he develop any  presyncopal or syncopal episodes that he should contact me immediately  and we should therefore go ahead and proceed with catheter ablation for  atrial flutter and unlikely subsequent pacemaker implantation.  The  patient  expresses his willingness to cooperate with this plan.  I have scheduled  him to follow up with me in 3 months.  I appreciate the opportunity to  participate in the care of Albert Hawkins.    Sincerely,      Hillis Range, MD  Electronically Signed    JA/MedQ  DD: 02/02/2008  DT: 02/03/2008  Job #: [id number]   CC:   Maisie Fus  Cheri Kearns, MD, [facility]  Titus Dubin. Alwyn Ren, MD,FACP,FCCP

## 2010-09-09 NOTE — Op Note (Signed)
NAME:  Albert Hawkins, Albert Hawkins NO.:  [id number]   MEDICAL RECORD NO.:  [id number]          PATIENT TYPE:  OIB   LOCATION:  2011                         FACILITY:  [facility]   PHYSICIAN:  Hillis Range, MD       DATE OF BIRTH:  1941-03-03   DATE OF PROCEDURE:  DATE OF DISCHARGE:                               OPERATIVE REPORT   SURGEON:  Hillis Range, MD.   PREPROCEDURE DIAGNOSES:  1. Atrial flutter.  2. Symptomatic second-degree atrioventricular block.   POSTPROCEDURE DIAGNOSIS:  1. Counterclockwise isthmus-dependent right atrial flutter.  2. Mobitz I second-degree atrioventricular block.   INTRODUCTION:  Albert Hawkins is a pleasant 70 year old gentleman who has a  known history of atrial flutter and symptomatic second-degree  atrioventricular block with heart rates in 30s and 40s at home.  He has  history of diabetes and hypertension.  He reports occasional fatigue  with moderate activities.  Previously he was evaluated by Dr. Antoine Hawkins  and a 24-hour Holter was obtained in June 2009, which revealed sinus  rhythm with paroxysmal atrial flutter.  The patient was found to have  Mobitz I second-degree atrioventricular block with heart rates in the  30s and 40s as well.  He presents today for EP study and radiofrequency  ablation of atrial flutter and also possible dual-chamber pacemaker  implantation.   DESCRIPTION OF THE PROCEDURE:  Informed written consent was obtained and  the patient was brought to the electrophysiology lab in a fasting state.  He was adequately sedated with intravenous medications as outlined in  the nursing report.  The patient's right groin was prepped and draped in  usual sterile fashion with EP lab staff.  Using a percutaneous Seldinger  technique, two 6-French and one 8-French hemostasis sheaths were placed  in the right common femoral vein.  A 6-French decapolar Polaris X  catheter was introduced through the right common femoral vein and  advanced  into the coronary sinus for recording and pacing from this  location.  A 6-French quadripolar Josephson catheter was introduced to  the right common femoral vein and advanced into the right ventricle for  recording and pacing.  This catheter was also positioned along the His  bundle location.  The patient presented to the electrophysiology lab in  typical appearing atrial flutter.  The surface 12-lead EKG was  consistent with counterclockwise isthmus-dependent right atrial flutter.  The tachycardia cycle length measured 230 milliseconds and a prolonged  RR interval of 1727 milliseconds was observed.  Transient entrainment  was performed from the cavotricuspid isthmus, which revealed a post  pacing interval equal to the tachycardia cycle length.  The HV interval  measured 54 milliseconds.  The QRS duration measured 84 milliseconds and  QT interval measured 445 milliseconds.  A 7-French Biosense Webster EZ  Wickett, 8-mm ablation catheter was introduced through the right common  femoral vein and advanced into the right atrium.  A 3-dimensional  electroanatomical mapping of the atrial activation sequence was  performed within the right atrium.  This documented counterclockwise  isthmus-dependent right atrial flutter by activation and propagation  maps.   I, therefore, placed the ablation catheter along the usual cavotricuspid  isthmus.  A series of radiofrequency applications were delivered between  the tricuspid valve annulus and the inferior vena cava along the usual  flutter isthmus.  The tachycardia slowed and then terminated during  ablation.  Additional radiofrequency applications were delivered.  Following ablation, a complete bidirectional isthmus block was achieved  as evidenced by differential atrial pacing from the low lateral right  atrium.  When pacing from both sides of the isthmus lesion, the stimulus  to earliest atrial activation across the line measured 210 milliseconds.  A  3-dimensional activation map was again performed with pacing from the  coronary sinus.  This again documented cavotricuspid isthmus block.  Following ablation, the patient remained in sinus rhythm.  The patient  was observed for 30 minutes without return of conduction across the  cavotricuspid isthmus.  Rapid atrial pacing was performed down to the  cycle length of 200 milliseconds with no tachycardia induced.  Ventricular pacing was performed at a cycle length of 800 milliseconds,  which revealed VA dissociation.  The patient was observed to have Mobitz  I second-degree AV block with RR intervals of 900 milliseconds.  The AH  interval measured at 205 milliseconds with an HV interval of 61  milliseconds following ablation with a QRS duration of 90 milliseconds.   Isoproterenol was infused at 2 mcg per minute with improvement in  conduction and RR intervals of 768 milliseconds conducting with a long  first-degree AV block.  The patient procedure was therefore considered  completed.  All catheters were removed and the sheaths were aspirated  and flushed.  The sheaths were removed and hemostasis was assured.  Isoproterenol was infused at 2 mcg per minute and a dual-chamber  pacemaker was implanted as reported separately.   CONCLUSION:  1. Isthmus-dependent counterclockwise right atrial flutter upon      presentation.  2. Successful radiofrequency ablation of atrial flutter along the      usual flutter isthmus with complete bidirectional isthmus block      achieved.  3. Mobitz I second-degree AV block even with atrial cycle lengths of      900 milliseconds, which improved with isoproterenol infusion.  4. No early apparent complication.  5. Dual-chamber pacemaker to be implanted as scheduled.  This will be      reported separately.      Hillis Range, MD  Electronically Signed     JA/MEDQ  D:  06/12/2008  T:  06/13/2008  Job:  [id number]   cc:   Rollene Rotunda, MD, [facility]

## 2010-09-09 NOTE — Assessment & Plan Note (Signed)
[facility]                         ELECTROPHYSIOLOGY OFFICE NOTE   CURVIN, HUNGER                       MRN:          [id number]  DATE:05/03/2008                            DOB:          08-31-1940    INTRODUCTION:  Mr. Albert Hawkins is a pleasant 70 year old gentleman with  paroxysmal atrial fibrillation, typical-appearing atrial flutter, and  bradycardia who presents today for followup.   PROBLEM LIST:  1. Paroxysmal atrial fibrillation.  2. Typical-appearing atrial flutter by EKG.  3. Bradycardia with advanced AV block during atrial flutter and 2:1 AV      block during sinus rhythm are observed.  4. Diabetes mellitus.  5. Hypertension.  6. Hyperlipidemia.  7. Obesity.  8. Umbilical hernia.  9. Skin cancer, resected.   CURRENT MEDICATIONS:  1. TriCor 145 mg daily.  2. Diovan 160 mg daily.  3. Actos 30 mg daily.  4. Metformin 500 mg b.i.d.  5. Coumadin to maintain an INR between 2 and 3.  6. Pravastatin 20 mg daily.  7. Lasix 40 mg p.r.n.  8. Zyrtec 10 mg p.r.n.   INTERVAL HISTORY:  Mr. Garverick presents today for further evaluation and  discussion regarding his atrial flutter and bradycardia.  Upon last  being seen in my clinic, he reported good exercise tolerance with no  presyncopal or syncopal episodes.  A recent Holter monitor placed by Dr.  Antoine Poche in September revealed predominant sinus bradycardia with heart  rates between 23 and 79 beats per minute with an average heart rate of  44 beats per minute.  The longest pulse was 3.1 seconds.  He was  therefore referred to me for pacemaker and atrial flutter ablation.  The  patient was reluctant to proceed with either procedure as he felt that  it had no symptoms.  Since that time, he has been more intentional about  evaluating his symptoms.  He does note that he has occasional fatigue  and feels that things slowed down when his heart rate is in the 30s  and 40s.  He does also report  occasional dizziness.  He denies  presyncope, syncope, chest discomfort, or symptoms of heart failure.  He  is otherwise without complaint today.   PHYSICAL EXAMINATION:  VITALS:  Blood pressure 114/66, heart rate 36,  respirations 18, and weight 224 pounds.  GENERAL:  The patient is a well-appearing male, in no acute distress.  He is alert and oriented x3.  HEENT:  Normocephalic, atraumatic.  Sclerae clear.  Conjunctivae pink.  Oropharynx clear.  NECK:  Supple.  No JVD, lymphadenopathy, thyromegaly, or bruits.  LUNGS:  Clear to auscultation bilaterally.  HEART:  Bradycardic, regular rhythm.  No murmurs, rubs, or gallops.  GI:  Soft, nontender, nondistended, positive bowel sounds.  EXTREMITIES:  No clubbing, cyanosis, or edema.  NEUROLOGIC:  Cranial nerves II through XII are intact.  Strength and  sensation are intact.  PSYCH:  Euthymic mood, full affect.   EKG reveals typical-appearing atrial flutter with prolonged RR  intervals.  The average ventricular rate is 36 beats per minute.   IMPRESSION:  Mr. Caratachea is a  pleasant 70 year old gentleman with  paroxysmal atrial fibrillation, typical-appearing atrial flutter, and  symptomatic bradycardia.  I have had a long discussion with the patient  today regarding his bradycardia.  He does appear to be symptomatic with  this.  I am also concerned that he is at increased risk for presyncope  and syncope.  I have explained the importance of this to him.  He is  aware that should he fall and hits head while on Coumadin, then this  could be catastrophic.  He also is aware that he could have a syncopal  episode while driving.  I advised him was to proceed with pacemaker  implantation and atrial flutter ablation.  The patient understands the  risks, benefits, and alternatives to atrial flutter ablation as well as  dual-chamber pacemaker implantation.  He again wishes to defer these  procedures for the present time.   PLAN:  1. No medication  changes were made today.  2. I have given the patient my business card and he is instructed to      contact my office should he wish to proceed with catheter ablation      for atrial flutter or pacemaker implantation in the near future.  3. I have scheduled the patient to follow up with me for further      discussion in 6 weeks.     Hillis Range, MD  Electronically Signed    JA/MedQ  DD: 05/03/2008  DT: 05/04/2008  Job #: [id number]   cc:   Rollene Rotunda, MD, [facility]. Alwyn Ren, MD,FACP,FCCP

## 2010-09-09 NOTE — Op Note (Signed)
NAME:  Albert Hawkins, REETZ NO.:  [id number]   MEDICAL RECORD NO.:  [id number]          PATIENT TYPE:  OIB   LOCATION:  2011                         FACILITY:  [facility]   PHYSICIAN:  Hillis Range, MD       DATE OF BIRTH:  1940-07-11   DATE OF PROCEDURE:  06/12/2008  DATE OF DISCHARGE:                               OPERATIVE REPORT   SURGEON:  Hillis Range, MD   PREPROCEDURE DIAGNOSIS:  Symptomatic Mobitz I second-degree  atrioventricular block.   POSTPROCEDURE DIAGNOSIS:  Symptomatic Mobitz I second-degree  atrioventricular block.   PROCEDURE:  Dual-chamber pacemaker implantation.   DESCRIPTION OF THE PROCEDURE:  Informed written consent was obtained and  the patient was brought to the Electrophysiology Lab in a fasting state.  He was adequately sedated with intravenous Versed and fentanyl as  outlined in the nursing report.  The patient's left chest was prepped  and draped in the usual sterile fashion by the EP lab staff.  The skin  overlying the left deltopectoral region was infiltrated with lidocaine  for local analgesia.  A 5-cm incision was made over the left  deltopectoral region.  A subcutaneous pacemaker pocket was fashioned  using a combination of sharp and blunt dissection.  Electrocautery was  used to assure hemostasis.  The left cephalic vein was visualized and  cannulated.  Through this vein, a [employer] Tendril SDX, model  [id number] (serial [id number]) right atrial lead and a [employer]  Tendril SDX, model [id number] (serial [id number]) right ventricular lead  were advanced with fluoroscopic visualization into the right atrial  appendage and distal septum of the right ventricular apex positions  respectively.  Initial lead measurements revealed an atrial lead P-wave  of 6.1 millivolts with an impedance of 495 ohms and a threshold of 0.5  volts at 0.5 milliseconds.  A prominent injury current was observed.  The right ventricular lead R-wave  measured 12.4 millivolts with an  impedance of 714 ohms and a threshold of 0.5 volts at 0.5 milliseconds.  A large and prominent injury current was again noted.  The leads were  then secured to the pectoralis fascia using 0 silk suture over the  suture sleeves.  The pacemaker pocket was then irrigated with copious  gentamicin solution and electrocautery was again used to assure  hemostasis.  The leads were then connected to a [employer] Medical Zephyr  XL DR, model [phone] (serial [id number]) dual-chamber pacemaker.  The  pacemaker was placed into the subcutaneous pocket.  The pocket was then  closed in 2 layers with 2-0 Vicryl suture for the subcutaneous and  subcuticular layers.  Steri-Strips and sterile bandage were then  applied.  There were no early apparent complications.   CONCLUSIONS:  1. Symptomatic Mobitz II second-degree AV block.  2. Successful implantation of a [employer] Medical Zephyr XL DR dual-      chamber pacemaker.  3. No early apparent complications.      Hillis Range, MD  Electronically Signed     JA/MEDQ  D:  06/12/2008  T:  06/13/2008  Job:  [id number]   cc:   Rollene Rotunda, MD, [facility]

## 2010-09-09 NOTE — Telephone Encounter (Signed)
Spoke w/ pt says since starting amaryl he has had intermittent low blood sugars of 61 before meals and after dinner says that when this happens he doesn't feel well.  Before meals average is 61-120 fasting and after meals 70-180. Wants to know what he should do.

## 2010-09-10 NOTE — Telephone Encounter (Signed)
I dictated "Glimiperide " & didn't check what Dragon printed

## 2010-09-10 NOTE — Telephone Encounter (Signed)
Spoke w/ pt aware of recommendations. 

## 2010-09-10 NOTE — Telephone Encounter (Signed)
Left msg for pt to return call.

## 2010-09-12 NOTE — Assessment & Plan Note (Signed)
[facility]                              CARDIOLOGY OFFICE NOTE   AMDREW, OBOYLE                       MRN:          [id number]  DATE:02/11/2006                            DOB:          1940-08-05    PRIMARY:  Dr. Lona Kettle.   REASON FOR PRESENTATION:  Patient with atrial flutter.   HISTORY OF PRESENT ILLNESS:  The patient is a 70 year old gentleman, who  presents for evaluation of atrial flutter.  This was documented by Dr.  Alwyn Ren.  On an EKG at the first visit in June and today, he was noted to be  in sinus rhythm.  He did not feel the flutter when he was in it.  He was  started on Coumadin.  He had an echocardiogram, which was poor acoustic  windows.  However, it suggested his EF to be 55-60%.  There were no  significant valvular abnormalities.  There was mild mitral regurgitation.  Of note, they did not report the left atrium to be large though I do not see  a specific measurement.  He does have an EKG suggesting left atrial  enlargement.   The patient returns.  He has been on Coumadin.  He denies any chest  pressure, neck discomfort or arm discomfort.  He does not notice any  palpitations.  He has had no syncope or pre-syncope.  He has no PND or  orthopnea.   PAST MEDICAL HISTORY:  1. Diabetes mellitus.  2. Hypertension.  3. Dyslipidemia.  4. Skin cancer, resected.  5. Umbilical hernia repair.   ALLERGIES:  NONE.   MEDICATIONS:  1. Tricor 145 mg a day.  2. Diovan 160 mg daily.  3. Toprol 25 mg daily.  4. Actos 30 mg daily.  5. Metformin 500 mg b.i.d.  6. Coumadin.  7. Amoxicillin.  8. Tussionex.   REVIEW OF SYSTEMS:  As stated in the HIP and otherwise negative review of  systems.   PHYSICAL EXAMINATION:  GENERAL:  The patient is in no distress.  VITAL SIGNS:  Blood pressure 148/82, heart rate 65 and regular, weight 241  pounds, body mass index 39.  HEENT:  Eyes unremarkable.  Pupils equal, round and reactive to  light.  Fundi not visualized, oral mucosa unremarkable.  NECK:  No jugular venous distention, wave form within normal limits.  Carotid upstroke brisk and symmetric, no bruits and no thyromegaly.  Lymphatics; no cervical, axillary or inguinal adenopathy.  LUNGS:  Clear to auscultation bilaterally.  BACK:  No costovertebral angle tenderness.  CHEST:  Unremarkable.  HEART:  PMI nondisplaced or sustained, S1 and S2 within normal limits.  No  S3.  No S4 and no murmurs.  ABDOMEN:  Obese, positive bowel sounds, normal in frequency and pitch, no  bruits, rebound, guarding, no midline positive mass, hepatomegaly,  splenomegaly.  SKIN:  No rashes, no __________.  EXTREMITIES:  2+ pulses, no edema.  NEURO:  Oriented to person, place and time.  Cranial nerves II-XII grossly  intact.  Motor grossly intact.   EKG - sinus rhythm, first degree A-V block, RSR prime  in V1, no acute ST-T  wave changes.   ASSESSMENT AND PLAN:  1. Atrial flutter.  The patient had one episode of atrial flutter.  We had      a long discussion about this again in the office (given 1/2 hour on      this appointment) and went through the physiology again.  I explained      to him that he is relatively low risk for stroke.  However, I would not      consider him lone atrial flutter with his age and I suspect left atrial      enlargement from his EKG.  He also has hypertension.  He understands      that it is somewhat of a judgment call to continue the Coumadin, but I      would suggest this.  An alternative would be atrial flutter ablation.      He does not want to consider this at this point.  Finally, I left him      with information regarding what might be the risk of coming off the      Coumadin.  Though this is low, it is still possible that he is having      paroxysms of solid atrial flutter and would be at higher risk for      stroke than an age match control without this dysrhythmia.  He      understands this and  chooses to continue with the Coumadin but will      talk to me further about atrial flutter ablation in the future.  2. Obesity.  He understands he needs to lose weight with diet and      exercise.  3. Risk reduction.  He discussed what he feels is aggressive management of      his diabetes, lipids and blood pressure.  I explained to him the      appropriateness of this given his constellation of risk factors      including his abdominal obesity and the metabolic syndrome.  4. Followup.  I would like to see the patient back in 6 months to re-      discuss this but sooner if he has any questions.    ______________________________  Rollene Rotunda, MD, [facility]    JH/MedQ  DD: 02/12/2006  DT: 02/15/2006  Job #: [id number]   cc:   Titus Dubin. Alwyn Ren, MD,FACP,FCCP

## 2010-09-23 ENCOUNTER — Ambulatory Visit (INDEPENDENT_AMBULATORY_CARE_PROVIDER_SITE_OTHER): Payer: BC Managed Care – PPO | Admitting: Internal Medicine

## 2010-09-23 ENCOUNTER — Encounter: Payer: Self-pay | Admitting: Internal Medicine

## 2010-09-23 DIAGNOSIS — L255 Unspecified contact dermatitis due to plants, except food: Secondary | ICD-10-CM

## 2010-09-23 MED ORDER — MOMETASONE FUROATE 0.1 % EX CREA: TOPICAL_CREAM | CUTANEOUS | Status: DC

## 2010-09-23 NOTE — Progress Notes (Signed)
Subjective:    Patient ID: Albert Hawkins, male    DOB: Sep 01, 1940, 70 y.o.   MRN: [id number]  HPI #1 RASH Location: LUE & abdomen Onset: 1 week ago  Course: improved as noted below Self-treated with: calamine topically, Benadryl orally & topically, Chlorox & water, medicated spray for poison oak/ivy             Improvement with treatment: some drying  History Pruritis: yes  Tenderness: no, not now  Trigger: flora exposure on golf [place] Feeling ill: no  Fever: no  Diabetic or immunocompromised: yes, see below    #2  Diabetes status assessment : Fasting or morning glucose range:   average :  95 for past 30 days .  Highest glucose 2 hours after any meal:  < 200, usually averages 140.  Hypoglycemia :  54  Two hrs post lunch  .                                                           Excess thirst; @ times ;  Excess hunger :constantly;  Excess urination:  @ times .                                             Lightheadedness with standing: no. Chest pain:  no ; Palpitations :no ;  Pain in  calves with walking: no                                                                                                                       Non healing skin  ulcers or sores,especially over the feet: no.  Numbness or tingling or burning in feet : no.                                                                                                                                                 Significant change in  Weight : up 10 # in past 4 weeks. Vision changes : @ times  Exercise : "little" ;  Nutrition/diet:  No diet.. Medication compliance : cut Metformin 2 weeks ago by 1/2 due to hypoglycemia. Eye exam : up to date ; Foot care : no.  A1c/ urine microalbumin monitor:  7.4%; risk reviewed.   Review of Systems     Objective:   Physical Exam Gen.: Overweight  in appearance. Alert, appropriate and cooperative throughout  exam. Eyes: No corneal or conjunctival inflammation noted. . Neck: No deformities, masses, or tenderness noted. Range of motion &. Thyroid normal. Lungs: Normal respiratory effort; chest expands symmetrically. Lungs are clear to auscultation without rales, wheezes, or increased work of breathing. Heart: Normal rate and rhythm. Normal S1 and S2. No gallop, click, or rub. No  murmur.  No clubbing, cyanosis, edema, or deformity noted. Range of motion  normal .Tone & strength  normal.Joints normal. Nail health  good. Vascular: Carotid, radial artery, dorsalis pedis and dorsalis posterior tibial pulses are full and equal. No bruits present. Neurologic: Alert and oriented x3. Deep tendon reflexes symmetrical and normal.  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  Psych: Mood and affect are normal. Normally interactive .Lymphatic: No lymphadenopathy is noted about the head, neck, or axillary areas.                                                                                         Assessment & Plan:  #1 classic Rhus dermatitis                                                                                                                                                                                      #2 DM, suboptimal control  Plan: see Orders

## 2010-09-23 NOTE — Patient Instructions (Addendum)
Diabetes Monitor   Depending on the type of diabetes that you have, how well your diabetes is controlled, your A1c may be measured 2 to 4 times each year. When someone is first diagnosed with diabetes or if control is not good, A1c may be ordered more frequently.   NORMAL VALUES  Non diabetic adults: 5 %-6.1%  Good diabetic control: 6.2-6.4 %  Fair diabetic control: 6.5-7%  Poor diabetic control: greater than 7 % ( except with additional factors such as  advanced age; significant coronary or neurologic disease,etc). Check the A1c every 6 months if it is < 6.5%; every 4 months if  6.5% or higher. Goals for home glucose monitoring are : fasting  or morning glucose goal of  90-150. Two hours after any meal , goal = < 180, preferably < 150. A1c 7.4 % = average sugar 169; risk = 48%.      Follow The  New Sugar Busters. Resume Metformin  ; hold Glimiperide  IF fasting glucose is consistently < 140. Check A1c in 8-10 weeks.

## 2010-10-04 ENCOUNTER — Other Ambulatory Visit: Payer: Self-pay | Admitting: Internal Medicine

## 2010-10-08 ENCOUNTER — Other Ambulatory Visit: Payer: Self-pay | Admitting: Internal Medicine

## 2010-10-08 NOTE — Telephone Encounter (Signed)
Due for Lipids/Hep 272.4/995.20, Last Lipid check 04/2009

## 2010-10-13 ENCOUNTER — Ambulatory Visit (INDEPENDENT_AMBULATORY_CARE_PROVIDER_SITE_OTHER): Payer: BC Managed Care – PPO | Admitting: *Deleted

## 2010-10-13 ENCOUNTER — Encounter: Payer: Self-pay | Admitting: *Deleted

## 2010-10-13 DIAGNOSIS — I4430 Unspecified atrioventricular block: Secondary | ICD-10-CM

## 2010-10-13 DIAGNOSIS — Z7901 Long term (current) use of anticoagulants: Secondary | ICD-10-CM

## 2010-10-13 LAB — POCT INR: INR: 2.3

## 2010-10-13 NOTE — Patient Instructions (Signed)
Pt informed no change 4 weeks 

## 2010-11-03 ENCOUNTER — Other Ambulatory Visit: Payer: Self-pay | Admitting: Internal Medicine

## 2010-11-17 ENCOUNTER — Ambulatory Visit (INDEPENDENT_AMBULATORY_CARE_PROVIDER_SITE_OTHER): Payer: BC Managed Care – PPO | Admitting: *Deleted

## 2010-11-17 DIAGNOSIS — I4891 Unspecified atrial fibrillation: Secondary | ICD-10-CM

## 2010-11-17 DIAGNOSIS — Z7901 Long term (current) use of anticoagulants: Secondary | ICD-10-CM

## 2010-11-17 LAB — POCT INR: INR: 2.5

## 2010-11-17 NOTE — Patient Instructions (Signed)
Pt advise no change recheck 4 weeks

## 2010-12-01 ENCOUNTER — Other Ambulatory Visit: Payer: Self-pay | Admitting: Internal Medicine

## 2010-12-01 NOTE — Telephone Encounter (Signed)
Lipid/Hep 272.4/995.20  

## 2010-12-15 ENCOUNTER — Encounter: Payer: Self-pay | Admitting: Internal Medicine

## 2010-12-22 ENCOUNTER — Encounter: Payer: Self-pay | Admitting: *Deleted

## 2010-12-22 ENCOUNTER — Ambulatory Visit (INDEPENDENT_AMBULATORY_CARE_PROVIDER_SITE_OTHER): Payer: BC Managed Care – PPO | Admitting: *Deleted

## 2010-12-22 DIAGNOSIS — I4891 Unspecified atrial fibrillation: Secondary | ICD-10-CM

## 2010-12-22 DIAGNOSIS — E119 Type 2 diabetes mellitus without complications: Secondary | ICD-10-CM

## 2010-12-22 DIAGNOSIS — Z7901 Long term (current) use of anticoagulants: Secondary | ICD-10-CM

## 2010-12-22 NOTE — Patient Instructions (Signed)
Pt instructed no change recheck 4 weeks 

## 2010-12-23 ENCOUNTER — Ambulatory Visit: Payer: BC Managed Care – PPO

## 2010-12-23 ENCOUNTER — Other Ambulatory Visit: Payer: BC Managed Care – PPO

## 2011-01-01 ENCOUNTER — Other Ambulatory Visit: Payer: Self-pay | Admitting: Internal Medicine

## 2011-01-01 NOTE — Telephone Encounter (Signed)
Lipid 272.4/Hep 995.20

## 2011-01-05 ENCOUNTER — Encounter: Payer: Self-pay | Admitting: Internal Medicine

## 2011-01-05 ENCOUNTER — Telehealth: Payer: Self-pay | Admitting: Internal Medicine

## 2011-01-05 ENCOUNTER — Ambulatory Visit (INDEPENDENT_AMBULATORY_CARE_PROVIDER_SITE_OTHER): Payer: BC Managed Care – PPO | Admitting: Internal Medicine

## 2011-01-05 DIAGNOSIS — I495 Sick sinus syndrome: Secondary | ICD-10-CM

## 2011-01-05 DIAGNOSIS — I4891 Unspecified atrial fibrillation: Secondary | ICD-10-CM

## 2011-01-05 DIAGNOSIS — I10 Essential (primary) hypertension: Secondary | ICD-10-CM

## 2011-01-05 DIAGNOSIS — I441 Atrioventricular block, second degree: Secondary | ICD-10-CM

## 2011-01-05 LAB — PACEMAKER DEVICE OBSERVATION
ATRIAL PACING PM: 11
DEVICE MODEL PM: 1285538
RV LEAD IMPEDENCE PM: 466 Ohm
RV LEAD THRESHOLD: 0.75 V
VENTRICULAR PACING PM: 97

## 2011-01-05 NOTE — Assessment & Plan Note (Signed)
Normal pacemaker function See Pace Art report No changes today  

## 2011-01-05 NOTE — Assessment & Plan Note (Signed)
Stable No change required today  

## 2011-01-05 NOTE — Patient Instructions (Signed)
Your physician wants you to follow-up in: 6 months with Device clinic 12 months with Dr Albert Hawkins will receive a reminder letter in the mail two months in advance. If you don't receive a letter, please call our office to schedule the follow-up appointment.

## 2011-01-05 NOTE — Telephone Encounter (Signed)
Spoke with patient's wife, informed her of A1c results, labs to be mailed once addressed

## 2011-01-05 NOTE — Assessment & Plan Note (Signed)
Stable with rare episodes Goal INR 2-3

## 2011-01-05 NOTE — Progress Notes (Signed)
The patient presents today for routine electrophysiology followup.  Since last being seen in our clinic, the patient reports doing very well.  Today, he denies symptoms of palpitations, chest pain, shortness of breath, orthopnea, PND, lower extremity edema, dizziness, presyncope, syncope, or neurologic sequela.  The patient feels that he is tolerating medications without difficulties and is otherwise without complaint today.   Past Medical History  Diagnosis Date  . 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 Benign essential HTN   . Neoplasm of uncertain behavior of skin   . Abnormal chest x-ray   . Atrial fibrillation     paroxysmal, on coumadin  . Diabetes mellitus type II   . Hearing loss, conductive, bilateral   . 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 Hyperlipidemia   . Sleep disorder   . Skin cancer   . PVC's (premature ventricular contractions)    Past Surgical History  Procedure Date  . Hand surgery     Right hand amputated and reattached   . Mohs surgery     above nose   . Pacemaker placement     06/12/2008, Dr.Rihana Kiddy  . Hernia repair     Umbilical  . 3 fatty tumors     benign    Current Outpatient Prescriptions  Medication Sig Dispense Refill  . acetaminophen (TYLENOL ARTHRITIS PAIN) 650 MG CR tablet Take 650 mg by mouth as needed. 1-2 by mouth as needed       . azithromycin (ZITHROMAX) 250 MG tablet As per pack; check PT/INR 03/12       . cetirizine (ZYRTEC) 10 MG tablet Take 10 mg by mouth as needed.       Marland Kitchen DIOVAN 160 MG tablet TAKE 1 TABLET DAILY  90 tablet  2  . fenofibrate micronized (LOFIBRA) 134 MG capsule TAKE 1 CAPSULE DAILY  90 capsule  0  . FLUoxetine (PROZAC) 10 MG tablet Take 10 mg by mouth daily.        . furosemide (LASIX) 40 MG tablet Take 40 mg by mouth daily.        Marland Kitchen glucose blood test strip Use as instructed       . metFORMIN (GLUCOPHAGE) 500 MG tablet TAKE 2 TABLETS TWICE A DAY WITH  LARGEST MEALS  360 tablet  1  . pravastatin (PRAVACHOL) 20 MG tablet TAKE 1 TABLET AT BEDTIME (LABS DUE)  90 tablet  0  . warfarin (COUMADIN) 2 MG tablet TAKE AS DIRECTED  90 tablet  0    Allergies  Allergen Reactions  . Amoxicillin     REACTION: nausea and diarrhea  . Ramipril   . Tramadol Hcl     REACTION: lightheaded    History   Social History  . Marital Status: Married    Spouse Name: N/A    Number of Children: N/A  . Years of Education: N/A   Occupational History  . Not on file.   Social History Main Topics  . Smoking status: Former Smoker    Quit date: 04/27/1985  . Smokeless tobacco: Not on file  . Alcohol Use: No  . Drug Use: No  . Sexually Active: Not on file   Other Topics Concern  . Not on file   Social History Narrative  . No narrative on file    Family History  Problem Relation Age of Onset  . Heart attack Paternal Grandfather   . Diabetes Paternal  Uncle   . Diabetes Maternal Grandfather   . Skin cancer Father   . Emphysema Father   . Coronary artery disease Father     (?MI)   Physical Exam: Filed Vitals:   01/05/11 1009  BP: 134/82  Pulse: 87  Height: 5\' 10"  (1.778 m)  Weight: 219 lb 12.8 oz (99.701 kg)    GEN- The patient is well appearing, alert and oriented x 3 today.   Head- normocephalic, atraumatic Eyes-  Sclera clear, conjunctiva pink Ears- hearing intact Oropharynx- clear Neck- supple, no JVP Lymph- no cervical lymphadenopathy Lungs- Clear to ausculation bilaterally, normal work of breathing Chest- pacemaker pocket is well healed Heart- Regular rate and rhythm, no murmurs, rubs or gallops, PMI not laterally displaced GI- soft, NT, ND, + BS Extremities- no clubbing, cyanosis, or edema MS- no significant deformity or atrophy Skin- no rash or lesion Psych- euthymic mood, full affect Neuro- strength and sensation are intact  Pacemaker interrogation- reviewed in detail today,  See PACEART report  Assessment and Plan:

## 2011-01-07 ENCOUNTER — Other Ambulatory Visit: Payer: Self-pay | Admitting: Internal Medicine

## 2011-01-19 ENCOUNTER — Ambulatory Visit (INDEPENDENT_AMBULATORY_CARE_PROVIDER_SITE_OTHER): Payer: BC Managed Care – PPO

## 2011-01-19 DIAGNOSIS — I4891 Unspecified atrial fibrillation: Secondary | ICD-10-CM

## 2011-01-19 DIAGNOSIS — Z7901 Long term (current) use of anticoagulants: Secondary | ICD-10-CM

## 2011-01-19 LAB — POCT INR
INR: 2.5
INR: 2.5

## 2011-01-19 NOTE — Patient Instructions (Addendum)
Per Dr.Hopper: No change re-check in 4 weeks

## 2011-02-09 ENCOUNTER — Other Ambulatory Visit: Payer: Self-pay | Admitting: Internal Medicine

## 2011-02-12 ENCOUNTER — Ambulatory Visit (INDEPENDENT_AMBULATORY_CARE_PROVIDER_SITE_OTHER): Payer: BC Managed Care – PPO | Admitting: *Deleted

## 2011-02-12 DIAGNOSIS — Z23 Encounter for immunization: Secondary | ICD-10-CM

## 2011-02-16 ENCOUNTER — Encounter: Payer: Self-pay | Admitting: *Deleted

## 2011-02-16 ENCOUNTER — Ambulatory Visit (INDEPENDENT_AMBULATORY_CARE_PROVIDER_SITE_OTHER): Payer: BC Managed Care – PPO | Admitting: *Deleted

## 2011-02-16 DIAGNOSIS — I4891 Unspecified atrial fibrillation: Secondary | ICD-10-CM

## 2011-02-16 DIAGNOSIS — Z7901 Long term (current) use of anticoagulants: Secondary | ICD-10-CM

## 2011-02-16 MED ORDER — HYDROCODONE-HOMATROPINE 5-1.5 MG/5ML PO SYRP: 5.0000 mL | ORAL_SOLUTION | Freq: Four times a day (QID) | ORAL | Status: DC | PRN

## 2011-02-16 NOTE — Progress Notes (Signed)
Addended by: Doristine Devoid on: 02/16/2011 10:18 AM   Modules accepted: Orders

## 2011-02-16 NOTE — Patient Instructions (Signed)
Pt instructed no change recheck in 4 weeks. Pt also complained of a cough and ask that cough med be called in to pharmacy instructed pt that per Hop ok to call in medication and will get sent to pharmacy.

## 2011-02-18 ENCOUNTER — Encounter: Payer: Self-pay | Admitting: Internal Medicine

## 2011-02-18 ENCOUNTER — Ambulatory Visit (INDEPENDENT_AMBULATORY_CARE_PROVIDER_SITE_OTHER): Payer: BC Managed Care – PPO | Admitting: Internal Medicine

## 2011-02-18 VITALS — BP 122/78 | HR 79 | Temp 98.3°F | Resp 16 | Wt 218.2 lb

## 2011-02-18 DIAGNOSIS — J209 Acute bronchitis, unspecified: Secondary | ICD-10-CM

## 2011-02-18 MED ORDER — FLUTICASONE-SALMETEROL 100-50 MCG/DOSE IN AEPB: 1.0000 | INHALATION_SPRAY | Freq: Two times a day (BID) | RESPIRATORY_TRACT | Status: DC

## 2011-02-18 MED ORDER — DOXYCYCLINE HYCLATE 100 MG PO CAPS: 100.0000 mg | ORAL_CAPSULE | ORAL | Status: AC

## 2011-02-18 NOTE — Patient Instructions (Addendum)
Advair one inhalation every 12 hours; gargle and spit after use.Plain Mucinex for thick secretions ;force NON dairy fluids for next 48 hrs. Use a Neti pot daily as needed for sinus congestion

## 2011-02-18 NOTE — Progress Notes (Signed)
  Subjective:    Patient ID: Albert Hawkins, male    DOB: 07-27-1940, 70 y.o.   MRN: [id number]  HPI Respiratory tract infection Onset/symptoms:10/20 as ST Exposures (illness/environmental/extrinsic):10/18 in MD office Progression of symptoms: 10/21 cough , rhinitis & nasal congestion Treatments/response:Zyrtec which resolved URI symptoms, Chloraseptic spray Present symptoms:myalgias Fever/chills/sweats:no Frontal headache:no Facial pain:no Nasal purulence:no  Dental pain:no Lymphadenopathy:no Wheezing/shortness of breath:10/23 onset of wheezing  Cough/sputum/hemoptysis: cough with scant clear mucus Associated extrinsic/allergic symptoms:itchy eyes/ sneezing: not significantlt  Smoking history:quit 1987           Review of Systems FBS 89- 110     Objective:   Physical Exam General appearance is of good health and nourishment; no acute distress or increased work of breathing is present.  No  lymphadenopathy about the head, neck, or axilla noted.   Eyes: No conjunctival inflammation or lid edema is present.   Ears:  External ear exam shows no significant lesions or deformities.  Otoscopic examination reveals some wax  Nose:  External nasal examination shows no deformity or inflammation. Nasal mucosa are pink and moist without lesions or exudates. No septal dislocation .No obstruction to airflow.   Oral exam: Dental hygiene is good; lips and gums are healthy appearing.There is no oropharyngeal erythema or exudate noted.   Neck:  No deformities, thyromegaly, masses, or tenderness noted.   Supple with full range of motion without pain.   Heart:  Normal rate and regular rhythm. S1 and S2 normal without gallop, murmur, click, rub or other extra sounds.   Lungs:Chest ; mild  wheezes, rhonchi,rales present after coughing.No increased work of breathing.    Extremities:  No cyanosis, edema, or clubbing  noted    Skin: Warm & dry         Assessment & Plan:  #1  bronchitis ,acute, some bronchospasm present  #2 diabetes; no exacerbation with this illness  Plan: See orders and recommendations

## 2011-03-01 ENCOUNTER — Other Ambulatory Visit: Payer: Self-pay | Admitting: Internal Medicine

## 2011-03-16 ENCOUNTER — Ambulatory Visit (INDEPENDENT_AMBULATORY_CARE_PROVIDER_SITE_OTHER): Payer: BC Managed Care – PPO | Admitting: *Deleted

## 2011-03-16 DIAGNOSIS — I4891 Unspecified atrial fibrillation: Secondary | ICD-10-CM

## 2011-03-16 DIAGNOSIS — Z7901 Long term (current) use of anticoagulants: Secondary | ICD-10-CM

## 2011-03-16 NOTE — Patient Instructions (Signed)
Return to office in 4 weeks for PT/INR  Continue current dose: 1 Tab daily except 1/2 tab on Tuesday, Thursday, Saturday

## 2011-03-27 ENCOUNTER — Other Ambulatory Visit: Payer: Self-pay | Admitting: Internal Medicine

## 2011-03-27 DIAGNOSIS — E119 Type 2 diabetes mellitus without complications: Secondary | ICD-10-CM

## 2011-03-30 ENCOUNTER — Other Ambulatory Visit (INDEPENDENT_AMBULATORY_CARE_PROVIDER_SITE_OTHER): Payer: BC Managed Care – PPO

## 2011-03-30 DIAGNOSIS — E119 Type 2 diabetes mellitus without complications: Secondary | ICD-10-CM

## 2011-03-30 LAB — HEMOGLOBIN A1C: Hgb A1c MFr Bld: 6.3 % (ref 4.6–6.5)

## 2011-03-30 LAB — CREATININE, SERUM: Creatinine, Ser: 1.5 mg/dL (ref 0.4–1.5)

## 2011-03-30 LAB — ALT: ALT: 29 U/L (ref 0–53)

## 2011-03-30 LAB — POTASSIUM: Potassium: 4.7 mEq/L (ref 3.5–5.1)

## 2011-03-30 NOTE — Progress Notes (Signed)
12  

## 2011-04-11 ENCOUNTER — Other Ambulatory Visit: Payer: Self-pay | Admitting: Internal Medicine

## 2011-04-13 ENCOUNTER — Ambulatory Visit (INDEPENDENT_AMBULATORY_CARE_PROVIDER_SITE_OTHER): Payer: BC Managed Care – PPO

## 2011-04-13 DIAGNOSIS — I4891 Unspecified atrial fibrillation: Secondary | ICD-10-CM

## 2011-04-13 DIAGNOSIS — Z7901 Long term (current) use of anticoagulants: Secondary | ICD-10-CM

## 2011-04-13 LAB — POCT INR
INR: 2.4
INR: 2.4

## 2011-04-13 NOTE — Patient Instructions (Signed)
Per Dr.Hopper no change and recheck in 4 weeks  

## 2011-05-09 ENCOUNTER — Other Ambulatory Visit: Payer: Self-pay | Admitting: Internal Medicine

## 2011-05-11 ENCOUNTER — Ambulatory Visit (INDEPENDENT_AMBULATORY_CARE_PROVIDER_SITE_OTHER): Payer: BC Managed Care – PPO | Admitting: *Deleted

## 2011-05-11 ENCOUNTER — Encounter: Payer: Self-pay | Admitting: *Deleted

## 2011-05-11 ENCOUNTER — Other Ambulatory Visit: Payer: Self-pay

## 2011-05-11 VITALS — BP 118/72 | HR 70 | Temp 98.3°F | Resp 14 | Wt 226.1 lb

## 2011-05-11 DIAGNOSIS — I4891 Unspecified atrial fibrillation: Secondary | ICD-10-CM

## 2011-05-11 LAB — POCT INR: INR: 2.7

## 2011-05-11 MED ORDER — GLUCOSE BLOOD VI STRP: ORAL_STRIP | Status: DC

## 2011-05-11 NOTE — Patient Instructions (Signed)
1 Tab daily except 1/2 tab on Tuesday, Thursday, Saturday Recheck in [4] weeks.

## 2011-05-11 NOTE — Telephone Encounter (Signed)
Refill glucose strips #100, refill x3. Diovan is not equivalent to losartan/HCT. I would recommend changing to losartan 100 mg one daily dispense 90. Report response of blood pressure

## 2011-05-11 NOTE — Telephone Encounter (Signed)
Patient was here in office for a Nurse visit and stated he would like for his Diovan to be changed to Valsartan/HCT 160/12.5 and sent to [facility] for a 90 day suppy, patient also needs a refill on one-touch test strips sent to [facility] on [address]   Test strips sent in, Dr.Hopper please advise on med change

## 2011-05-12 MED ORDER — LOSARTAN POTASSIUM 100 MG PO TABS: 100.0000 mg | ORAL_TABLET | Freq: Every day | ORAL | Status: DC

## 2011-05-12 NOTE — Telephone Encounter (Signed)
RX sent, patient aware  

## 2011-05-25 ENCOUNTER — Other Ambulatory Visit: Payer: Self-pay | Admitting: Internal Medicine

## 2011-05-25 MED ORDER — PRAVASTATIN SODIUM 20 MG PO TABS: ORAL_TABLET | ORAL | Status: DC

## 2011-05-25 NOTE — Telephone Encounter (Signed)
RX sent

## 2011-06-02 ENCOUNTER — Other Ambulatory Visit: Payer: Self-pay | Admitting: Internal Medicine

## 2011-06-02 NOTE — Telephone Encounter (Signed)
RX  Sent to [facility] on 05/25/2011. I called [facility], rx was received and shipped out already

## 2011-06-08 ENCOUNTER — Ambulatory Visit (INDEPENDENT_AMBULATORY_CARE_PROVIDER_SITE_OTHER): Payer: Medicare Other | Admitting: *Deleted

## 2011-06-08 DIAGNOSIS — Z7901 Long term (current) use of anticoagulants: Secondary | ICD-10-CM

## 2011-06-08 DIAGNOSIS — I4891 Unspecified atrial fibrillation: Secondary | ICD-10-CM

## 2011-06-08 LAB — POCT INR: INR: 2.3

## 2011-06-08 NOTE — Patient Instructions (Signed)
Return to office in 4 weeks  Continue current dose: 1 Tab daily except 1/2 tab on Tuesday, Thursday, Saturday

## 2011-07-02 ENCOUNTER — Ambulatory Visit (INDEPENDENT_AMBULATORY_CARE_PROVIDER_SITE_OTHER): Payer: Medicare Other | Admitting: Family Medicine

## 2011-07-02 ENCOUNTER — Encounter: Payer: Self-pay | Admitting: Family Medicine

## 2011-07-02 VITALS — BP 118/75 | HR 65 | Temp 98.3°F | Ht 68.75 in | Wt 226.0 lb

## 2011-07-02 DIAGNOSIS — J209 Acute bronchitis, unspecified: Secondary | ICD-10-CM

## 2011-07-02 MED ORDER — AMOXICILLIN 500 MG PO TABS: 500.0000 mg | ORAL_TABLET | Freq: Two times a day (BID) | ORAL | Status: AC

## 2011-07-02 MED ORDER — HYDROCODONE-HOMATROPINE 5-1.5 MG/5ML PO SYRP: 5.0000 mL | ORAL_SOLUTION | Freq: Four times a day (QID) | ORAL | Status: AC | PRN

## 2011-07-02 NOTE — Progress Notes (Signed)
  Subjective:    Patient ID: Albert Hawkins, male    DOB: Sep 26, 1940, 71 y.o.   MRN: [id number]  HPI URI- pt reports 'i have bronchitis every Oct/Nov and again in March'.  sxs started Tuesday w/ 'deep sore throat'.  + cough- hacking but non-productive.  Subjective fevers.  No ear pain, facial pain, nasal congestion.  + sick contacts.   Review of Systems For ROS see HPI     Objective:   Physical Exam  Vitals reviewed. Constitutional: He appears well-developed and well-nourished. No distress.  HENT:  Head: Normocephalic and atraumatic.  Right Ear: Tympanic membrane normal.  Left Ear: Tympanic membrane normal.  Nose: No mucosal edema or rhinorrhea. Right sinus exhibits no maxillary sinus tenderness and no frontal sinus tenderness. Left sinus exhibits no maxillary sinus tenderness and no frontal sinus tenderness.  Mouth/Throat: Mucous membranes are normal. No oropharyngeal exudate, posterior oropharyngeal edema or posterior oropharyngeal erythema.  Eyes: Conjunctivae and EOM are normal. Pupils are equal, round, and reactive to light.  Neck: Normal range of motion. Neck supple.  Cardiovascular: Normal rate, regular rhythm and normal heart sounds.   Pulmonary/Chest: Effort normal and breath sounds normal. No respiratory distress. He has no wheezes.       + hacking cough  Lymphadenopathy:    He has no cervical adenopathy.  Skin: Skin is warm and dry.          Assessment & Plan:

## 2011-07-02 NOTE — Patient Instructions (Signed)
This is bronchitis Start the Amoxicillin twice daily- w/ food. Use the cough syrup as needed Drink plenty of fluids REST! Call with any questions or concerns Hang in there!

## 2011-07-05 NOTE — Assessment & Plan Note (Signed)
New.  Pt's sxs and PE consistent w/ infxn.  Start abx.  Reviewed supportive care and red flags that should prompt return.  Pt expressed understanding and is in agreement w/ plan.  

## 2011-07-07 ENCOUNTER — Ambulatory Visit (INDEPENDENT_AMBULATORY_CARE_PROVIDER_SITE_OTHER): Payer: Medicare Other

## 2011-07-07 DIAGNOSIS — I4891 Unspecified atrial fibrillation: Secondary | ICD-10-CM

## 2011-07-07 DIAGNOSIS — Z79899 Other long term (current) drug therapy: Secondary | ICD-10-CM

## 2011-07-07 NOTE — Patient Instructions (Signed)
Per MD, no change and recheck in 4 weeks (2 mg on Sun,M,W,F and 1 mg all other days)

## 2011-07-27 ENCOUNTER — Telehealth: Payer: Self-pay | Admitting: Internal Medicine

## 2011-07-27 ENCOUNTER — Ambulatory Visit (INDEPENDENT_AMBULATORY_CARE_PROVIDER_SITE_OTHER): Payer: Medicare Other | Admitting: Internal Medicine

## 2011-07-27 VITALS — BP 130/72 | HR 77 | Temp 98.4°F | Wt 224.0 lb

## 2011-07-27 DIAGNOSIS — Z7901 Long term (current) use of anticoagulants: Secondary | ICD-10-CM

## 2011-07-27 DIAGNOSIS — J209 Acute bronchitis, unspecified: Secondary | ICD-10-CM

## 2011-07-27 DIAGNOSIS — I4891 Unspecified atrial fibrillation: Secondary | ICD-10-CM

## 2011-07-27 LAB — POCT INR: INR: 2.7

## 2011-07-27 MED ORDER — FENOFIBRATE MICRONIZED 134 MG PO CAPS: ORAL_CAPSULE | ORAL | Status: DC

## 2011-07-27 MED ORDER — DOXYCYCLINE HYCLATE 100 MG PO TABS: 100.0000 mg | ORAL_TABLET | Freq: Two times a day (BID) | ORAL | Status: AC

## 2011-07-27 MED ORDER — HYDROCODONE-HOMATROPINE 5-1.5 MG/5ML PO SYRP: 5.0000 mL | ORAL_SOLUTION | Freq: Four times a day (QID) | ORAL | Status: AC | PRN

## 2011-07-27 MED ORDER — GLIMEPIRIDE 2 MG PO TABS: 1.0000 mg | ORAL_TABLET | Freq: Every day | ORAL | Status: DC

## 2011-07-27 MED ORDER — PRAVASTATIN SODIUM 20 MG PO TABS: ORAL_TABLET | ORAL | Status: DC

## 2011-07-27 NOTE — Patient Instructions (Addendum)
Use an anti-inflammatory cream such as Aspercreme or Zostrix cream twice a day to the affected area as needed. In lieu of this warm moist compresses or  hot water bottle can be used. Do not apply ice.    INR Today; 2.7  Take 1 mg on this wed then resume regular schedule 2 mg Sun,M,W, Fri and 1 mg all other days and recheck in 10 days.

## 2011-07-27 NOTE — Telephone Encounter (Signed)
Patient states he is supposed to have labs done. He has appt. For PT check on 08-10-11 and would like to come in that same morning for his labs. What does he need?

## 2011-07-27 NOTE — Progress Notes (Signed)
  Subjective:    Patient ID: Albert Hawkins, male    DOB: 1940-10-01, 71 y.o.   MRN: [id number]  HPI Respiratory tract infection Onset/symptoms:seen 1st week in March & Amox Rxed Progression of symptoms:some improvement but cough has persisted Treatments/response:OTC Delsym minimally beneficial Present symptoms: Fever/chills/sweats:no Frontal headache:no Facial pain:no Nasal purulence:no Sore throat:no Dental pain:no Lymphadenopathy:no Wheezing/shortness of breath:yes Cough/sputum:white mucus occasionally Associated extrinsic/allergic symptoms:itchy eyes/ sneezing:occasionally Past medical history: Seasonal allergies: yes/asthma:no Pleurisy : no but pain @ L inferior /ant rib cage Smoking history:quit 1987           Review of Systems PT/INR was therapeutic @ 2.4 on 07/07/11. He denies epistaxis, hemoptysis, melena, rectal bleeding, or abnormal bruising or bleeding    Objective:   Physical Exam General appearance:well nourished; no acute distress or increased work of breathing is present.  No  lymphadenopathy about the head, neck, or axilla noted.   Eyes: No conjunctival inflammation or lid edema is present.   Ears:  External ear exam shows no significant lesions or deformities.  Otoscopic examination reveals wax bilaterally .  Nose:  External nasal examination shows no deformity or inflammation. Nasal mucosa are pink and moist without lesions or exudates. No septal dislocation or deviation.No obstruction to airflow.   Oral exam: Dental hygiene is good; lips and gums are healthy appearing.There is no oropharyngeal erythema or exudate noted but minimal uvular erythema.      Heart:DISTANT heart sounds.  Normal rate and regular rhythm. S1 and S2 normal without gallop, murmur, click, rub or other extra sounds.   Lungs:Chest clear to auscultation; no wheezes, rhonchi,rales ,or rubs present.No increased work of breathing.   Chest wall tenderness LLSB  Extremities:  No  cyanosis, edema, or clubbing  noted    Skin: Warm & dry          Assessment & Plan:  #1 acute bronchitis w/o bronchospasm #2 chest wall tenderness Plan: See orders and recommendations

## 2011-07-28 NOTE — Telephone Encounter (Signed)
Spoke with patient, patient scheduled appointment for CPX for 10/06/2011. Patient will wait and have all labs needed on that day. Patient aware to come fasting to that appointment.

## 2011-08-03 ENCOUNTER — Ambulatory Visit: Payer: Medicare Other

## 2011-08-10 ENCOUNTER — Ambulatory Visit (INDEPENDENT_AMBULATORY_CARE_PROVIDER_SITE_OTHER): Payer: Medicare Other | Admitting: *Deleted

## 2011-08-10 ENCOUNTER — Other Ambulatory Visit: Payer: Medicare Other

## 2011-08-10 ENCOUNTER — Encounter: Payer: Self-pay | Admitting: Internal Medicine

## 2011-08-10 VITALS — BP 130/72 | HR 66 | Temp 97.6°F | Wt 224.0 lb

## 2011-08-10 DIAGNOSIS — I4891 Unspecified atrial fibrillation: Secondary | ICD-10-CM

## 2011-08-10 DIAGNOSIS — I441 Atrioventricular block, second degree: Secondary | ICD-10-CM

## 2011-08-10 DIAGNOSIS — Z7901 Long term (current) use of anticoagulants: Secondary | ICD-10-CM

## 2011-08-10 DIAGNOSIS — I495 Sick sinus syndrome: Secondary | ICD-10-CM

## 2011-08-10 LAB — PACEMAKER DEVICE OBSERVATION
AL AMPLITUDE: 3.8 mv
AL IMPEDENCE PM: 428 Ohm
AL THRESHOLD: 0.75 V
BAMS-0003: 60 {beats}/min
BATTERY VOLTAGE: 2.79 V
DEVICE MODEL PM: 1285538
RV LEAD AMPLITUDE: 12 mv

## 2011-08-10 NOTE — Patient Instructions (Signed)
Return to office in 4 weeks  Continue current dose: 1 tab daily except 1/2 gab on Tues, Thurs, Sat

## 2011-08-10 NOTE — Progress Notes (Signed)
Pacer check in clinic  

## 2011-08-25 ENCOUNTER — Other Ambulatory Visit: Payer: Self-pay | Admitting: Internal Medicine

## 2011-08-25 MED ORDER — WARFARIN SODIUM 2 MG PO TABS: ORAL_TABLET | ORAL | Status: DC

## 2011-08-25 MED ORDER — FLUOXETINE HCL 10 MG PO CAPS: ORAL_CAPSULE | ORAL | Status: DC

## 2011-08-25 NOTE — Telephone Encounter (Signed)
refills x 2  Warfarin Tab 2MG   Qty 90 Take as directed Last written 12.15.12   &  Fluoxetine HCL Cap 10MG  1 PO Daily Qty 90 Last written 10.15.12  Last OV 4.30.13

## 2011-08-25 NOTE — Telephone Encounter (Signed)
Rx sent 

## 2011-09-07 ENCOUNTER — Ambulatory Visit (INDEPENDENT_AMBULATORY_CARE_PROVIDER_SITE_OTHER): Payer: Medicare Other

## 2011-09-07 VITALS — BP 122/78 | HR 67 | Wt 221.0 lb

## 2011-09-07 DIAGNOSIS — Z7901 Long term (current) use of anticoagulants: Secondary | ICD-10-CM

## 2011-09-07 DIAGNOSIS — I4891 Unspecified atrial fibrillation: Secondary | ICD-10-CM

## 2011-09-07 LAB — POCT INR: INR: 2.4

## 2011-09-07 NOTE — Patient Instructions (Signed)
No change in Coumadin dose and recheck in 4 weeks

## 2011-10-06 ENCOUNTER — Ambulatory Visit (INDEPENDENT_AMBULATORY_CARE_PROVIDER_SITE_OTHER): Payer: Medicare Other | Admitting: Internal Medicine

## 2011-10-06 ENCOUNTER — Encounter: Payer: Self-pay | Admitting: Internal Medicine

## 2011-10-06 ENCOUNTER — Other Ambulatory Visit: Payer: Self-pay | Admitting: Internal Medicine

## 2011-10-06 VITALS — BP 120/72 | HR 72 | Temp 97.5°F | Ht 69.5 in | Wt 221.0 lb

## 2011-10-06 DIAGNOSIS — Z0000 Encounter for general adult medical examination without abnormal findings: Secondary | ICD-10-CM

## 2011-10-06 DIAGNOSIS — G479 Sleep disorder, unspecified: Secondary | ICD-10-CM

## 2011-10-06 DIAGNOSIS — E785 Hyperlipidemia, unspecified: Secondary | ICD-10-CM

## 2011-10-06 DIAGNOSIS — I4891 Unspecified atrial fibrillation: Secondary | ICD-10-CM

## 2011-10-06 DIAGNOSIS — I10 Essential (primary) hypertension: Secondary | ICD-10-CM

## 2011-10-06 DIAGNOSIS — Z85828 Personal history of other malignant neoplasm of skin: Secondary | ICD-10-CM

## 2011-10-06 DIAGNOSIS — E119 Type 2 diabetes mellitus without complications: Secondary | ICD-10-CM

## 2011-10-06 DIAGNOSIS — Z7901 Long term (current) use of anticoagulants: Secondary | ICD-10-CM

## 2011-10-06 LAB — BASIC METABOLIC PANEL
CO2: 31 mEq/L (ref 19–32)
Calcium: 9.2 mg/dL (ref 8.4–10.5)
Creatinine, Ser: 1.7 mg/dL — ABNORMAL HIGH (ref 0.4–1.5)

## 2011-10-06 LAB — CBC WITH DIFFERENTIAL/PLATELET
Basophils Relative: 0.5 % (ref 0.0–3.0)
Eosinophils Absolute: 0.2 10*3/uL (ref 0.0–0.7)
Lymphocytes Relative: 27.7 % (ref 12.0–46.0)
MCHC: 33.7 g/dL (ref 30.0–36.0)
Neutrophils Relative %: 58.9 % (ref 43.0–77.0)
RBC: 4.96 Mil/uL (ref 4.22–5.81)
WBC: 5.1 10*3/uL (ref 4.5–10.5)

## 2011-10-06 LAB — LIPID PANEL
Cholesterol: 145 mg/dL (ref 0–200)
LDL Cholesterol: 60 mg/dL (ref 0–99)
Total CHOL/HDL Ratio: 3
Triglycerides: 200 mg/dL — ABNORMAL HIGH (ref 0.0–149.0)
VLDL: 40 mg/dL (ref 0.0–40.0)

## 2011-10-06 LAB — HEPATIC FUNCTION PANEL
AST: 34 U/L (ref 0–37)
Bilirubin, Direct: 0.1 mg/dL (ref 0.0–0.3)
Total Bilirubin: 0.7 mg/dL (ref 0.3–1.2)

## 2011-10-06 LAB — MICROALBUMIN / CREATININE URINE RATIO: Creatinine,U: 304.9 mg/dL

## 2011-10-06 LAB — POCT INR: INR: 3.2

## 2011-10-06 LAB — HEMOGLOBIN A1C: Hgb A1c MFr Bld: 6.7 % — ABNORMAL HIGH (ref 4.6–6.5)

## 2011-10-06 MED ORDER — METFORMIN HCL 500 MG PO TABS: ORAL_TABLET | ORAL | Status: DC

## 2011-10-06 NOTE — Progress Notes (Signed)
Subjective:    Patient ID: Albert Hawkins, male    DOB: February 17, 1941, 71 y.o.   MRN: [id number]  HPI Medicare Wellness Visit:  The following psychosocial & medical history were reviewed as required by Medicare.   Social history: caffeine: 1 cup coffee/ day , alcohol:  no ,  tobacco use : quit 1987  & exercise : no   Home & personal  safety / fall risk: no issues, activities of daily living: no limtations, seatbelt use : yes , and smoke alarm employment : yes.  Power of Attorney/Living Will status : in place  Vision ( as recorded per Nurse) & Hearing  evaluation :  Ophth exam 9/12 , no retinopathy. Hearing improved with ear hygiene. Orientation :oriented X 3 , memory & recall :good,  math testing: good,and mood & affect : normal . Depression / anxiety: denied Travel history : last 43 [place] , immunization status :Shingles needed , transfusion history:  no, and preventive health surveillance ( colonoscopies, BMD , etc as per protocol/ SOC): no colonoscopy to date , SOC reviewed, Dental care:  Seen every 6 mos . Chart reviewed &  Updated. Active issues reviewed & addressed.       Review of Systems HYPERTENSION: Disease Monitoring: Blood pressure range-up to high 130s-140s/ high  80s  Chest pain, palpitations- no       Dyspnea- no Medications: Compliance- yes  Lightheadedness,Syncope- no    Edema- only when Lasix missed  DIABETES: Disease Monitoring: Blood Sugar ranges-averages 130 in am Polyuria/phagia/dipsia- no       Visual problems- intermittent blurring Medications: Compliance- yes  Hypoglycemic symptoms-no  HYPERLIPIDEMIA: Disease Monitoring: See symptoms for Hypertension Medications: Compliance- yes Abd pain, bowel changes-no   Muscle aches- no but joint pain @ night in A/C    He has some sleep issues, both difficulty going to sleep as well as frequent awakening. He has been told that he does snore significantly. He is unaware of any apnea.          Objective:    Physical Exam Gen.:  well-nourished in appearance. Alert, appropriate and cooperative throughout exam. Head: Normocephalic without obvious abnormalities;  pattern alopecia . Moustache Eyes: No corneal or conjunctival inflammation noted. Pupils equal round reactive to light and accommodation. Extraocular motion intact. Vision grossly normal. Ears: External  ear exam reveals no significant lesions or deformities. Canals : some wax on R ; L TM normal. Hearing is grossly normal bilaterally. Nose: External nasal exam reveals no deformity or inflammation. Nasal mucosa are pink and moist. No lesions or exudates noted.   Mouth: Oral mucosa and oropharynx reveal no lesions or exudates. Teeth in good repair. Neck: No deformities, masses, or tenderness noted. Range of motion & Thyroid normal Lungs: Normal respiratory effort; chest expands symmetrically. Lungs are clear to auscultation without rales, wheezes, or increased work of breathing. Heart: Regular (paced ) rhythm. Distant heart sounds. Abdomen: Bowel sounds normal; abdomen soft and nontender. No masses, organomegaly or hernias noted. Genitalia/ DRE :   Genital exam unremarkable.Prostate is normal without enlargement, asymmetry, nodularity, or induration.                                     Musculoskeletal/extremities: No deformity or scoliosis noted of  the thoracic or lumbar spine. No clubbing, cyanosis, edema, or deformity noted. Range of motion  normal .Tone & strength  normal.Joints normal. Nail health  good.  Vascular: Carotid, radial artery, dorsalis pedis and  posterior tibial pulses are full and equal. No bruits present. Neurologic: Alert and oriented x3. Deep tendon reflexes symmetrical and normal.          Skin: Intact without suspicious lesions or rashes. Lymph: No cervical, axillary, or inguinal lymphadenopathy present. Psych: Mood and affect are normal. Normally interactive                                                                                          Assessment & Plan:  #1 Medicare Wellness Exam; criteria met ; data entered #2 Problem List reviewed ; Assessment/ Recommendations made  #3 sleep disorder; history of snoring. Rule out sleep apnea  #4 long overdue for colonoscopic surveillance. He requesst this be deferred until #3 can be completed Plan: see Orders

## 2011-10-06 NOTE — Telephone Encounter (Signed)
Rx sent 

## 2011-10-06 NOTE — Assessment & Plan Note (Signed)
10/06/11 EKG reveals atrial fibrillation with ventricles paced at 65.

## 2011-10-06 NOTE — Telephone Encounter (Signed)
refill metformin tab 500mg  tablet Qty 90 Take 2 tablets twice a day with largest meals  Last wrt 12.15.12 Patient has CPE today  Last seen 4.1.13

## 2011-10-06 NOTE — Patient Instructions (Addendum)
Preventive Health Care: Exercise at least 30-45 minutes a day,  3-4 days a week.  Eat a low-fat diet with lots of fruits and vegetables, up to 7-9 servings per day. Avoid obesity; your goal is waist measurement < 40 inches.Consume less than 40 grams of sugar per day from foods & drinks with High Fructose Corn Sugar as # 1,2,3 or # 4 on label. As per the Standard of Care , screening Colonoscopy recommended @ 50 & every 5-10 years thereafter . More frequent monitor would be dictated by family history or findings @ Colonoscopy . "Flip-flop" your present warfarin dose: Take 1 mg 4 days a week and 2 mg 3 days a week. Check PT/INR in 4 weeks Blood Pressure Goal  Ideally is an AVERAGE < 135/85. This AVERAGE should be calculated from @ least 5-7 BP readings taken @ different times of day on different days of week. You should not respond to isolated BP readings , but rather the AVERAGE for that week Please try to go on My Chart within the next 24 hours to allow me to release the results directly to you.

## 2011-11-02 ENCOUNTER — Encounter: Payer: Self-pay | Admitting: *Deleted

## 2011-11-02 ENCOUNTER — Ambulatory Visit (INDEPENDENT_AMBULATORY_CARE_PROVIDER_SITE_OTHER): Payer: Medicare Other | Admitting: *Deleted

## 2011-11-02 DIAGNOSIS — Z7901 Long term (current) use of anticoagulants: Secondary | ICD-10-CM

## 2011-11-02 DIAGNOSIS — I4891 Unspecified atrial fibrillation: Secondary | ICD-10-CM

## 2011-11-02 NOTE — Patient Instructions (Addendum)
1 Tab daily except 1/2 tab on Tuesday, Thursday, Saturday Repeat INR in [1] month per Dr. Conni Slipper

## 2011-11-04 ENCOUNTER — Telehealth: Payer: Self-pay | Admitting: Internal Medicine

## 2011-11-04 MED ORDER — PRAVASTATIN SODIUM 20 MG PO TABS: ORAL_TABLET | ORAL | Status: DC

## 2011-11-04 NOTE — Telephone Encounter (Signed)
Refill: Pravastatin 20mg  tab. Take 1 by mouth at bedtime (labs due). 90 day supply

## 2011-11-04 NOTE — Telephone Encounter (Signed)
RX sent

## 2011-11-17 ENCOUNTER — Ambulatory Visit (INDEPENDENT_AMBULATORY_CARE_PROVIDER_SITE_OTHER): Payer: Medicare Other | Admitting: Pulmonary Disease

## 2011-11-17 ENCOUNTER — Encounter: Payer: Self-pay | Admitting: Pulmonary Disease

## 2011-11-17 VITALS — BP 128/64 | HR 73 | Temp 98.6°F | Ht 70.0 in | Wt 224.6 lb

## 2011-11-17 DIAGNOSIS — G479 Sleep disorder, unspecified: Secondary | ICD-10-CM

## 2011-11-17 NOTE — Progress Notes (Signed)
  Subjective:    Patient ID: Albert Hawkins, male    DOB: 18-Apr-1941, 71 y.o.   MRN: [id number]  HPI  70/M referred for evaluation of obstructive sleep apnea. He has atrial fibrillation diabetes and hypertension. He has gained about 20 pounds in the last 2 years. Epworth sleepiness score is 3/24. He complains of inability to fall asleep. Sleep latency is about 30 minutes to 1.5 hours. Bedtime is 10-11 PM. He has been on Prozac since November 2012 and he takes this is at bedtime. He is frequent awakenings one to 3 times every night, he takes Lasix for the last 2 years. He sleeps on his side on one pillow, he cannot lay on his back. Loud snoring was noted on a recent golfing trip by his buddies. He is out of bed at 6 to 8 AM, denies headaches or dryness of mouth. There is no history suggestive of cataplexy, sleep paralysis or parasomnias . He underwent ablation in February 2012 but atrial fibrillation recurred in 3 months. Reflux symptoms do wake him up from sleep on occasion.  Past Medical History  Diagnosis Date  . Mobitz (type) II atrioventricular block     S/P  PPM (SJM) by Dr Johney Frame  . Benign essential HTN   . Neoplasm of uncertain behavior of skin   . Abnormal chest x-ray   . Atrial fibrillation     paroxysmal, on coumadin  . Diabetes mellitus type II   . Hearing loss, conductive, bilateral   . 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 Hyperlipidemia   . Sleep disorder   . Skin cancer   . PVC's (premature ventricular contractions)      Review of Systems  Constitutional: Positive for appetite change and unexpected weight change. Negative for fever, chills, diaphoresis, activity change and fatigue.  HENT: Positive for congestion. Negative for hearing loss, nosebleeds, sore throat, rhinorrhea, sneezing, trouble swallowing, neck pain, voice change, postnasal drip, sinus pressure and ear discharge.   Eyes: Negative for discharge, itching and visual  disturbance.  Respiratory: Positive for cough. Negative for choking, chest tightness, shortness of breath and wheezing.   Cardiovascular: Positive for palpitations. Negative for chest pain and leg swelling.  Gastrointestinal: Negative for nausea, vomiting, abdominal pain and constipation.  Genitourinary: Negative for dysuria, urgency, frequency, hematuria, decreased urine volume and difficulty urinating.  Musculoskeletal: Negative for back pain, joint swelling and gait problem.  Skin: Negative for rash.  Neurological: Negative for dizziness, tremors, syncope, weakness, light-headedness and headaches.  Hematological: Does not bruise/bleed easily.  Psychiatric/Behavioral: Positive for disturbed wake/sleep cycle. Negative for confusion and agitation. The patient is not nervous/anxious.        Objective:   Physical Exam  Gen. Pleasant, obese, in no distress, normal affect ENT - no lesions, no post nasal drip Neck: No JVD, no thyromegaly, no carotid bruits Lungs: no use of accessory muscles, no dullness to percussion, clear without rales or rhonchi  Cardiovascular: Rhythm regular, heart sounds  normal, no murmurs or gallops, no peripheral edema Abdomen: soft and non-tender, no hepatosplenomegaly, BS normal. Musculoskeletal: No deformities, no cyanosis or clubbing Neuro:  alert, non focal        Assessment & Plan:

## 2011-11-17 NOTE — Assessment & Plan Note (Addendum)
Given excessive daytime somnolence, narrow pharyngeal exam, witnessed apneas & loud snoring, obstructive sleep apnea is very likely & an overnight polysomnogram will be scheduled as a split study. The pathophysiology of obstructive sleep apnea , it's cardiovascular consequences & modes of treatment including CPAP were discused with the patient in detail & they evidenced understanding. He does have some sleep onset insomnia and I have asked him to try 5 mg of melatonin 3 hours prior to bedtime. He will try to infiltrate 30 minutes of exercise into his daily regimen. Have asked him to take Prozac in the daytime rather than bedtime.

## 2011-11-17 NOTE — Patient Instructions (Addendum)
Schedule sleep study You may have obstructive sleep apnea You can try melatonin 5 mg 3hrs before sleep - [facility]

## 2011-11-19 ENCOUNTER — Other Ambulatory Visit: Payer: Self-pay | Admitting: Internal Medicine

## 2011-11-30 ENCOUNTER — Other Ambulatory Visit (INDEPENDENT_AMBULATORY_CARE_PROVIDER_SITE_OTHER): Payer: Medicare Other

## 2011-11-30 DIAGNOSIS — N289 Disorder of kidney and ureter, unspecified: Secondary | ICD-10-CM

## 2011-11-30 DIAGNOSIS — I4891 Unspecified atrial fibrillation: Secondary | ICD-10-CM

## 2011-11-30 LAB — PROTIME-INR: INR: 2 ratio — ABNORMAL HIGH (ref 0.8–1.0)

## 2011-11-30 LAB — BASIC METABOLIC PANEL
BUN: 22 mg/dL (ref 6–23)
CO2: 29 mEq/L (ref 19–32)
Glucose, Bld: 106 mg/dL — ABNORMAL HIGH (ref 70–99)
Potassium: 4.9 mEq/L (ref 3.5–5.1)
Sodium: 138 mEq/L (ref 135–145)

## 2011-12-01 ENCOUNTER — Telehealth: Payer: Self-pay

## 2011-12-01 NOTE — Telephone Encounter (Signed)
Spoke with patient's wife, verbalized understanding of Dr.Hopper's instruction.

## 2011-12-01 NOTE — Telephone Encounter (Signed)
Message copied by Maurice Small on Tue Dec 01, 2011  4:58 PM ------      Message from: Pecola Lawless      Created: Mon Nov 30, 2011  5:44 PM       PT/INR is therapeutic. No change in warfarin dose; repeat PT/INR in 4 weeks       ----- Message -----         From: [facility]         Sent: 11/30/2011   1:42 PM           To: Pecola Lawless, MD

## 2011-12-06 ENCOUNTER — Ambulatory Visit (HOSPITAL_BASED_OUTPATIENT_CLINIC_OR_DEPARTMENT_OTHER): Payer: Medicare Other | Attending: Pulmonary Disease

## 2011-12-06 VITALS — Ht 70.0 in | Wt 220.0 lb

## 2011-12-06 DIAGNOSIS — R0609 Other forms of dyspnea: Secondary | ICD-10-CM | POA: Insufficient documentation

## 2011-12-06 DIAGNOSIS — G4733 Obstructive sleep apnea (adult) (pediatric): Secondary | ICD-10-CM

## 2011-12-06 DIAGNOSIS — G479 Sleep disorder, unspecified: Secondary | ICD-10-CM

## 2011-12-06 DIAGNOSIS — Z79899 Other long term (current) drug therapy: Secondary | ICD-10-CM | POA: Insufficient documentation

## 2011-12-06 DIAGNOSIS — R0989 Other specified symptoms and signs involving the circulatory and respiratory systems: Secondary | ICD-10-CM | POA: Insufficient documentation

## 2011-12-06 DIAGNOSIS — Z7901 Long term (current) use of anticoagulants: Secondary | ICD-10-CM | POA: Insufficient documentation

## 2011-12-06 DIAGNOSIS — E119 Type 2 diabetes mellitus without complications: Secondary | ICD-10-CM | POA: Insufficient documentation

## 2011-12-06 DIAGNOSIS — G4761 Periodic limb movement disorder: Secondary | ICD-10-CM | POA: Insufficient documentation

## 2011-12-17 ENCOUNTER — Telehealth: Payer: Self-pay | Admitting: Pulmonary Disease

## 2011-12-17 DIAGNOSIS — G2581 Restless legs syndrome: Secondary | ICD-10-CM

## 2011-12-17 DIAGNOSIS — G4733 Obstructive sleep apnea (adult) (pediatric): Secondary | ICD-10-CM

## 2011-12-17 NOTE — Telephone Encounter (Signed)
No sleep apnea on study He did have limb movements which would occasionally wake him up. Have him obtain iron studies -iron/TIBC/ ferritin - prior to next appt pl

## 2011-12-18 NOTE — Procedures (Signed)
NAME:  Albert Hawkins, Albert Hawkins NO.:  [id number]  MEDICAL RECORD NO.:  [id number]          PATIENT TYPE:  OUT  LOCATION:  SLEEP CENTER                 FACILITY:  [facility]  PHYSICIAN:  Oretha Milch, MD      DATE OF BIRTH:  [birth year]  DATE OF STUDY:  12/06/2011                           NOCTURNAL POLYSOMNOGRAM  REFERRING PHYSICIAN:  Oretha Milch, MD  INDICATION FOR STUDY:  [name] is a 71 year old diabetic with non- refreshing sleep and loud snoring.  At the time of this study, he weighed 220 pounds with a height of 5 feet 10 inches, BMI of 32, neck size of 16 inches.  EPWORTH SLEEPINESS SCORE:  7.  MEDICATIONS:  Included fluoxetine, furosemide, glimepiride, losartan, metformin, pravastatin, warfarin, melatonin, acetaminophen, cetirizine, and fenofibrate at that time.  This nocturnal polysomnogram was performed with a sleep technologist in attendance.  EEG, EOG, EMG, EKG, and respiratory parameters were recorded.  Sleep stages, arousals, limb movements, and respiratory data was scored according to criteria laid out by the [employer] of [employer].  SLEEP ARCHITECTURE:  Lights out was at 11:10 p.m., lights on was at 5:25 a.m. Total sleep time was 155 minutes with a sleep period time of 238 minutes and a sleep efficiency of 42%. Sleep latency was 104 minutes. Sleep stages of the percentage of total sleep time was N1 11%, N2 89%. Wake after sleep onset was 35 minutes.  Slow wave or REM sleep was not noted.  Supine sleep accounted for 94% of the time.  AROUSAL DATA:  There were total of 37 arousals with an arousal index of 14 events per hour, of these 8 were spontaneous and the rest were associated with limb movements.  RESPIRATORY DATA:  There were 0 obstructive apneas, 1 central apnea, 0 mixed apneas, and 2 hypopneas with an apnea-hypopnea index of 1.3 events per hour.  No RERAs were noted.  LIMB MOVEMENT DATA:  There were 369 limb movements with a limb  movements PLM index of 142 events per hour.  The PLM arousal index was 10 events per hour.  OXYGEN DATA:  Desaturation index was 11 events per hour.  The lowest desaturation was 91%.  He spent 0 minutes with a saturation less than 98%.  CARDIAC DATA:  The low heart rate was 30 beats per minute.  The high heart rate recorded was an artifact.  No arrhythmias were noted.  MOVEMENT-PARASOMNIA:  Prolonged sleep latency, but poor sleep efficiency.  Significant limb movements were noted.  IMPRESSION: 1. No evidence of obstructive sleep apnea. 2. 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 3. No evidence of cardiac arrhythmias, or behavioral disturbance     during sleep.  RECOMMENDATIONS: 1. Correlate clinically for insomnia.  Melatonin can be continued as     treatment for sleep onset insomnia. 2. Iron studies should be obtained and if the ferritin is less than 50,     supplementation may be needed.  Please correlate with a     clinical history of restless legs syndrome.     Oretha Milch, MD  RVA/MEDQ  D:  12/17/2011 12:14:39  T:  12/18/2011 02:43:03  Job:  [id number]

## 2011-12-18 NOTE — Telephone Encounter (Signed)
lmomtcb x1 for pt--need to schedule pt also for OV for next available

## 2011-12-21 NOTE — Telephone Encounter (Signed)
lmomtcb x 2  

## 2011-12-22 NOTE — Telephone Encounter (Signed)
I called and spoke with pt and he is scheduled to see RA on 12/25/11 at 11:00. He is going to come in about 20-25 minutes early to have labs done. He asked if i would call his home # to leave appt information on his VM--I confirmed home phone # (i have done so).

## 2011-12-25 ENCOUNTER — Ambulatory Visit (INDEPENDENT_AMBULATORY_CARE_PROVIDER_SITE_OTHER): Payer: Medicare Other | Admitting: Pulmonary Disease

## 2011-12-25 ENCOUNTER — Other Ambulatory Visit (INDEPENDENT_AMBULATORY_CARE_PROVIDER_SITE_OTHER): Payer: Medicare Other

## 2011-12-25 ENCOUNTER — Encounter: Payer: Self-pay | Admitting: Pulmonary Disease

## 2011-12-25 VITALS — BP 120/72 | HR 61 | Temp 97.5°F | Ht 70.0 in | Wt 221.4 lb

## 2011-12-25 DIAGNOSIS — G4761 Periodic limb movement disorder: Secondary | ICD-10-CM

## 2011-12-25 DIAGNOSIS — E119 Type 2 diabetes mellitus without complications: Secondary | ICD-10-CM

## 2011-12-25 DIAGNOSIS — G2581 Restless legs syndrome: Secondary | ICD-10-CM

## 2011-12-25 LAB — IBC PANEL: Saturation Ratios: 22.4 % (ref 20.0–50.0)

## 2011-12-25 NOTE — Progress Notes (Signed)
  Subjective:    Patient ID: Albert Hawkins, male    DOB: 1940/11/05, 71 y.o.   MRN: [id number]  HPI 70/M referred for evaluation of obstructive sleep apnea.  He has atrial fibrillation diabetes and hypertension. He has gained about 20 pounds in the last 2 years.  Epworth sleepiness score is 3/24.  He complains of inability to fall asleep. Sleep latency is about 30 minutes to 1.5 hours.  Bedtime is 10-11 PM. He has been on Prozac since November 2012 and he takes this is at bedtime. He is frequent awakenings one to 3 times every night, he takes Lasix for the last 2 years. He sleeps on his side on one pillow, he cannot lay on his back. Loud snoring was noted on a recent golfing trip by his buddies. He is out of bed at 6 to 8 AM, denies headaches or dryness of mouth.  There is no history suggestive of cataplexy, sleep paralysis or parasomnias .  He underwent ablation in February 2012 but atrial fibrillation recurred in 3 months. Reflux symptoms do wake him up from sleep on occasion. >> prozac in daytime Melatonin 5mg   No sleep apnea on study - but TST only 2.5 h & no supine sleep There were 369 limb movements with a limb movements PLM index of 142 events per hour. The PLM arousal index was 10 events per hour. had iron studies done this morning - nml satn.  He is taking melatonin 5 mg every night and he sleeps very well. he has only had 1 bad night in bout 5 weeks.   Review of Systems neg for any significant sore throat, dysphagia, itching, sneezing, nasal congestion or excess/ purulent secretions, fever, chills, sweats, unintended wt loss, pleuritic or exertional cp, hempoptysis, orthopnea pnd or change in chronic leg swelling. Also denies presyncope, palpitations, heartburn, abdominal pain, nausea, vomiting, diarrhea or change in bowel or urinary habits, dysuria,hematuria, rash, arthralgias, visual complaints, headache, numbness weakness or ataxia.     Objective:   Physical Exam  Gen.  Pleasant, obese, in no distress ENT - no lesions, no post nasal drip Neck: No JVD, no thyromegaly, no carotid bruits Lungs: no use of accessory muscles, no dullness to percussion, decreased without rales or rhonchi  Cardiovascular: Rhythm regular, heart sounds  normal, no murmurs or gallops, no peripheral edema Musculoskeletal: No deformities, no cyanosis or clubbing , no tremors        Assessment & Plan:

## 2011-12-25 NOTE — Assessment & Plan Note (Addendum)
Home sleep study - wonder if false negative PSG due to inadequate sleep time Will proceed with home pSG due to high clinical pre test prob Stay off melatonin x 2 weeks after you complete 60 days & report back next visit If worse , consider medication for restless legs (Mirapex)

## 2011-12-25 NOTE — Patient Instructions (Signed)
Home sleep study We will call you with results of iron studies Stay off melatonin x 2 weeks after you complete 60 days & report back next visit Lets hold off on the medication for restless legs (Mirapex)

## 2012-01-04 ENCOUNTER — Ambulatory Visit (INDEPENDENT_AMBULATORY_CARE_PROVIDER_SITE_OTHER): Payer: Medicare Other | Admitting: *Deleted

## 2012-01-04 VITALS — BP 130/76 | HR 60 | Wt 220.0 lb

## 2012-01-04 DIAGNOSIS — Z7901 Long term (current) use of anticoagulants: Secondary | ICD-10-CM

## 2012-01-04 DIAGNOSIS — I4891 Unspecified atrial fibrillation: Secondary | ICD-10-CM

## 2012-01-04 LAB — POCT INR: INR: 2.3

## 2012-01-11 ENCOUNTER — Telehealth: Payer: Self-pay | Admitting: Pulmonary Disease

## 2012-01-11 ENCOUNTER — Ambulatory Visit (INDEPENDENT_AMBULATORY_CARE_PROVIDER_SITE_OTHER): Payer: Medicare Other | Admitting: Pulmonary Disease

## 2012-01-11 DIAGNOSIS — G4761 Periodic limb movement disorder: Secondary | ICD-10-CM

## 2012-01-11 DIAGNOSIS — G4733 Obstructive sleep apnea (adult) (pediatric): Secondary | ICD-10-CM

## 2012-01-11 NOTE — Telephone Encounter (Signed)
Home study showed moderate OSA - 15 events /hour He would qualify for CPAP . If willing , proceed with autoCPAP 5-12 cm, humidity, mask of choice, download in 4 wks

## 2012-01-13 NOTE — Telephone Encounter (Signed)
I spoke with pt and he stated he has a lot going on right now and will have to wait on this. He stated he will let us know when he comes in 02/11/12 for his F/U with RA. Will forward to Dr. Vassie Loll so he is aware

## 2012-01-14 ENCOUNTER — Ambulatory Visit: Payer: Medicare Other | Admitting: Pulmonary Disease

## 2012-01-14 NOTE — Telephone Encounter (Signed)
ok 

## 2012-01-25 ENCOUNTER — Other Ambulatory Visit: Payer: Self-pay | Admitting: Internal Medicine

## 2012-01-25 MED ORDER — LOSARTAN POTASSIUM 100 MG PO TABS: 100.0000 mg | ORAL_TABLET | Freq: Every day | ORAL | Status: DC

## 2012-01-25 MED ORDER — METFORMIN HCL 500 MG PO TABS: ORAL_TABLET | ORAL | Status: DC

## 2012-01-25 MED ORDER — FENOFIBRATE MICRONIZED 134 MG PO CAPS: ORAL_CAPSULE | ORAL | Status: DC

## 2012-01-25 MED ORDER — WARFARIN SODIUM 2 MG PO TABS: ORAL_TABLET | ORAL | Status: DC

## 2012-01-25 NOTE — Telephone Encounter (Signed)
All rx request sent to pharmacy

## 2012-01-25 NOTE — Telephone Encounter (Signed)
REFILLS X 4 --- Last ov 6.11.13 V70 1-Fenofibrate Micronized (Cap) 134 MG MICRO TAKE 1 CAPSULE DAILY  90 day supply  2-metformin tab 500mg  tablet take 2 by mouth daily with largest meals 90-day supply   3-losartan pot tab 100mg  tablet, take one tablet by mouth daily 90-day supply  4-Warfarin tab 2mg  tablet take as directed *GENERIC FOR COUMADIN* 90-day supply

## 2012-01-26 ENCOUNTER — Encounter: Payer: Self-pay | Admitting: *Deleted

## 2012-01-26 DIAGNOSIS — Z950 Presence of cardiac pacemaker: Secondary | ICD-10-CM | POA: Insufficient documentation

## 2012-01-29 ENCOUNTER — Telehealth: Payer: Self-pay | Admitting: Pulmonary Disease

## 2012-01-29 NOTE — Telephone Encounter (Signed)
lmomtcb  

## 2012-02-01 ENCOUNTER — Ambulatory Visit (INDEPENDENT_AMBULATORY_CARE_PROVIDER_SITE_OTHER): Payer: Medicare Other | Admitting: Internal Medicine

## 2012-02-01 ENCOUNTER — Encounter: Payer: Self-pay | Admitting: Internal Medicine

## 2012-02-01 VITALS — BP 150/86 | HR 82 | Ht 70.0 in | Wt 216.8 lb

## 2012-02-01 DIAGNOSIS — I10 Essential (primary) hypertension: Secondary | ICD-10-CM

## 2012-02-01 DIAGNOSIS — I441 Atrioventricular block, second degree: Secondary | ICD-10-CM

## 2012-02-01 DIAGNOSIS — I4891 Unspecified atrial fibrillation: Secondary | ICD-10-CM

## 2012-02-01 LAB — PACEMAKER DEVICE OBSERVATION
AL THRESHOLD: 0.5 V
ATRIAL PACING PM: 17
BAMS-0003: 60 {beats}/min
DEVICE MODEL PM: 1285538
RV LEAD THRESHOLD: 0.75 V

## 2012-02-01 MED ORDER — HYDROCHLOROTHIAZIDE 12.5 MG PO CAPS: 12.5000 mg | ORAL_CAPSULE | Freq: Every day | ORAL | Status: DC

## 2012-02-01 NOTE — Patient Instructions (Signed)
Your physician wants you to follow-up in: 6 months in the device clinic and 12 months with Dr Johney Frame Bonita Quin will receive a reminder letter in the mail two months in advance. If you don't receive a letter, please call our office to schedule the follow-up appointment.  Your physician has recommended you make the following change in your medication:  1) Start HCTZ 12.5mg  daily  Will need to follow up with his PCP in 4 weeks with a BMP

## 2012-02-01 NOTE — Telephone Encounter (Signed)
Pt reports that is going to see Dr Johney Frame this week and will discuss CPAP with him and then will let us know if he wants to proceed with this or not.

## 2012-02-01 NOTE — Assessment & Plan Note (Signed)
Normal pacemaker function See Pace Art report No changes today  

## 2012-02-01 NOTE — Assessment & Plan Note (Signed)
Above goal today Add hctz 12.5mg  daily bmet by PCP in 4 weeks

## 2012-02-01 NOTE — Assessment & Plan Note (Signed)
afib burden is 44% His rates are controlled and he is mostly asymptomatic No changes today

## 2012-02-01 NOTE — Progress Notes (Signed)
PCP: Marga Melnick, MD   The patient presents today for routine electrophysiology followup.  Since last being seen in our clinic, the patient reports doing very well.  His concern is with the recent death of his mother and back surgery that his wife had.  Today, he denies symptoms of palpitations, chest pain, shortness of breath, orthopnea, PND, lower extremity edema, dizziness, presyncope, syncope, or neurologic sequela.  The patient feels that he is tolerating medications without difficulties and is otherwise without complaint today.   Past Medical History  Diagnosis Date  . Mobitz (type) II atrioventricular block     S/P  PPM (SJM) by Dr Johney Frame  . Benign essential HTN   . Neoplasm of uncertain behavior of skin   . Abnormal chest x-ray   . Atrial fibrillation     paroxysmal, on coumadin  . Diabetes mellitus type II   . Hearing loss, conductive, bilateral   . 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 Hyperlipidemia   . Sleep disorder   . Skin cancer   . PVC's (premature ventricular contractions)    Past Surgical History  Procedure Date  . Hand surgery     Right thumb amputated and reattached   . Mohs surgery     above nose   . Pacemaker placement     06/12/2008, Dr. Hillis Range  . Hernia repair     Umbilical  . 3 fatty tumors     benign    Current Outpatient Prescriptions  Medication Sig Dispense Refill  . acetaminophen (TYLENOL ARTHRITIS PAIN) 650 MG CR tablet Take 650 mg by mouth as needed. 1-2 by mouth as needed       . cetirizine (ZYRTEC) 10 MG tablet Take 10 mg by mouth as needed.       . fenofibrate micronized (LOFIBRA) 134 MG capsule TAKE 1 CAPSULE DAILY  90 capsule  2  . FLUoxetine (PROZAC) 10 MG capsule TAKE 1 CAPSULE DAILY  90 capsule  2  . furosemide (LASIX) 40 MG tablet Take 40 mg by mouth daily as needed.       Marland Kitchen glimepiride (AMARYL) 2 MG tablet TAKE 0.5 TABLETS (1 MG TOTAL) BY MOUTH DAILY BEFORE BREAKFAST.  45 tablet  0  .  glucose blood test strip Use as instructed  100 each  3  . losartan (COZAAR) 100 MG tablet Take 1 tablet (100 mg total) by mouth daily.  90 tablet  2  . metFORMIN (GLUCOPHAGE) 500 MG tablet TAKE 2 TABLETS TWICE A DAY WITH LARGEST MEALS  360 tablet  0  . Multiple Vitamin (MULTIVITAMIN) tablet Take 1 tablet by mouth daily.        . pravastatin (PRAVACHOL) 20 MG tablet TAKE 1 TABLET AT BEDTIME (LABS DUE)  90 tablet  3  . warfarin (COUMADIN) 2 MG tablet TAKE AS DIRECTED  90 tablet  0  . Melatonin 5 MG TABS Take 1 tablet by mouth at bedtime.        Allergies  Allergen Reactions  . Amoxicillin     REACTION: nausea and diarrhea  . Ramipril     Cough only  . Tramadol Hcl     REACTION: lightheaded    History   Social History  . Marital Status: Married    Spouse Name: N/A    Number of Children: N/A  . Years of Education: N/A   Occupational History  . Not on file.   Social History Main Topics  .  Smoking status: Former Smoker -- 2.0 packs/day for 10 years    Types: Cigarettes    Quit date: 04/27/1985  . Smokeless tobacco: Never Used  . Alcohol Use: No  . Drug Use: No  . Sexually Active: Not on file   Other Topics Concern  . Not on file   Social History Narrative  . No narrative on file    Family History  Problem Relation Age of Onset  . Heart attack Paternal Grandfather 13  . Diabetes Paternal Uncle   . Diabetes Maternal Grandfather   . Skin cancer Father   . Emphysema Father   . Coronary artery disease Father     (?MI)   Physical Exam: Filed Vitals:   02/01/12 0928  BP: 150/86  Pulse: 82  Height: 5\' 10"  (1.778 m)  Weight: 216 lb 12.8 oz (98.34 kg)  SpO2: 99%    GEN- The patient is well appearing, alert and oriented x 3 today.   Head- normocephalic, atraumatic Eyes-  Sclera clear, conjunctiva pink Ears- hearing intact Oropharynx- clear Neck- supple, no JVP Lymph- no cervical lymphadenopathy Lungs- Clear to ausculation bilaterally, normal work of  breathing Chest- pacemaker pocket is well healed Heart- Regular rate and rhythm, no murmurs, rubs or gallops, PMI not laterally displaced GI- soft, NT, ND, + BS Extremities- no clubbing, cyanosis, or edema MS- no significant deformity or atrophy Skin- no rash or lesion Psych- euthymic mood, full affect Neuro- strength and sensation are intact  Pacemaker interrogation- reviewed in detail today,  See PACEART report  Assessment and Plan:

## 2012-02-02 ENCOUNTER — Ambulatory Visit: Payer: Medicare Other

## 2012-02-02 ENCOUNTER — Encounter: Payer: Self-pay | Admitting: Internal Medicine

## 2012-02-02 ENCOUNTER — Ambulatory Visit (INDEPENDENT_AMBULATORY_CARE_PROVIDER_SITE_OTHER): Payer: Medicare Other | Admitting: Internal Medicine

## 2012-02-02 ENCOUNTER — Telehealth: Payer: Self-pay | Admitting: Pulmonary Disease

## 2012-02-02 VITALS — BP 136/84 | HR 76 | Resp 13 | Wt 215.6 lb

## 2012-02-02 DIAGNOSIS — G4733 Obstructive sleep apnea (adult) (pediatric): Secondary | ICD-10-CM

## 2012-02-02 DIAGNOSIS — I495 Sick sinus syndrome: Secondary | ICD-10-CM

## 2012-02-02 DIAGNOSIS — I4891 Unspecified atrial fibrillation: Secondary | ICD-10-CM

## 2012-02-02 DIAGNOSIS — G4761 Periodic limb movement disorder: Secondary | ICD-10-CM

## 2012-02-02 DIAGNOSIS — G479 Sleep disorder, unspecified: Secondary | ICD-10-CM

## 2012-02-02 DIAGNOSIS — Z7901 Long term (current) use of anticoagulants: Secondary | ICD-10-CM

## 2012-02-02 DIAGNOSIS — Z23 Encounter for immunization: Secondary | ICD-10-CM

## 2012-02-02 LAB — POCT INR: INR: 2.2

## 2012-02-02 NOTE — Progress Notes (Signed)
  Subjective:    Patient ID: PHU RECORD, male    DOB: [birth year], 71 y.o.   MRN: [id number]  HPI He is here to discuss his sleep apnea; he is unaware of the apneic spells. Testing revealed up to 15 episodes per hour. He was also documented to have periodic limb movements of which he was unaware.  He describes falling asleep x after 10-30 minutes of watching the TV with sound muted. He will awaken after 2 hours; but he is able to go back to sleep. He will reawaken 2-4 hours later. This pattern for the night.  He does take Zyrtec at bedtime. He  also took melatonin for a total of 60 days. He felt that melatonin was of benefit in "resetting his clock". He does not travel at this time.      Review of Systems  He denies nightmares; excess intake of stimulants; alcohol ingestion after the meal; or daytime naps. He does have exogenous stress related to his wife's recent back surgery and his mother's death.  Significantly he has a past history of second-degree AV block; atrial fibrillation; and bradycardia tachycardia syndrome.        Objective:   Physical Exam General appearance:well nourished; no acute distress or increased work of breathing is present.  No  lymphadenopathy about the head, neck, or axilla noted.   Eyes: No conjunctival inflammation or lid edema is present. There is no scleral icterus.   Nose:  External nasal examination shows no deformity or inflammation. Nasal mucosa are pink and moist without lesions or exudates. No septal deviation Slight polyp formation R nare.No obstruction to airflow.   Oral exam: Dental hygiene is good; lips and gums are healthy appearing.There is minimal oropharyngeal erythema ; no significant crowding noted.   Neck:  No deformities, thyromegaly, masses, or tenderness noted.   Supple with full range of motion without pain.   Heart:  Normal rate and regular rhythm. S1 and S2 normal without gallop, murmur, click, rub or other extra sounds. Distant  heart sounds  Lungs:Chest clear to auscultation; no wheezes, rhonchi,rales ,or rubs present.No increased work of breathing.    Extremities:  No cyanosis, edema, or clubbing  noted    Skin: Warm & dry           Assessment & Plan:  #1 documented sleep apnea  #2 periodic limb movements  #3 significant recurrent dysrhythmias  Recommendation: I would encourage a CPAP strongly because of #3. The relief he has noted with melatonin is somewhat surprising as this agent is most beneficial in sleep disorders related to time zone travel. Certainly the Zyrtec is a sedating antihistamine and should only be used at bedtime.

## 2012-02-02 NOTE — Patient Instructions (Addendum)
PT/INR is therapeutic. No change in warfarin dose; repeat PT/INR in 4 weeks . Blood Pressure Goal  Ideally is an AVERAGE < 135/85. This AVERAGE should be calculated from @ least 5-7 BP readings taken @ different times of day on different days of week. You should not respond to isolated BP readings , but rather the AVERAGE for that week

## 2012-02-02 NOTE — Telephone Encounter (Signed)
I spoke with the pt and on 01-11-12 Dr. Vassie Loll wrote the following: Oretha Milch., MD 01/11/2012 1:35 PM Signed  Home study showed moderate OSA - 15 events /hour  He would qualify for CPAP .  If willing , proceed with autoCPAP 5-12 cm, humidity, mask of choice, download in 4 wks  Pt wanted to think about this at the time and has called back and advised he is ready to start the CPAP. Order placed and pt is aware he will be contacted by homecare company. Carron Curie, CMA

## 2012-02-04 ENCOUNTER — Encounter: Payer: Medicare Other | Admitting: Internal Medicine

## 2012-02-11 ENCOUNTER — Ambulatory Visit: Payer: Medicare Other | Admitting: Pulmonary Disease

## 2012-02-29 ENCOUNTER — Other Ambulatory Visit: Payer: Self-pay | Admitting: Internal Medicine

## 2012-03-01 ENCOUNTER — Ambulatory Visit (INDEPENDENT_AMBULATORY_CARE_PROVIDER_SITE_OTHER): Payer: Medicare Other

## 2012-03-01 VITALS — BP 130/78 | HR 69 | Wt 217.4 lb

## 2012-03-01 DIAGNOSIS — Z7901 Long term (current) use of anticoagulants: Secondary | ICD-10-CM

## 2012-03-01 DIAGNOSIS — I4891 Unspecified atrial fibrillation: Secondary | ICD-10-CM

## 2012-03-01 DIAGNOSIS — E119 Type 2 diabetes mellitus without complications: Secondary | ICD-10-CM

## 2012-03-01 NOTE — Patient Instructions (Addendum)
Current dose: 1 mg daily EXCEPT 2 mg on M/W/F  Per MD, no change in dose indicated. Recheck in 3 weeks

## 2012-03-03 ENCOUNTER — Other Ambulatory Visit (INDEPENDENT_AMBULATORY_CARE_PROVIDER_SITE_OTHER): Payer: Medicare Other

## 2012-03-03 DIAGNOSIS — E119 Type 2 diabetes mellitus without complications: Secondary | ICD-10-CM

## 2012-03-04 LAB — HEMOGLOBIN A1C: Hgb A1c MFr Bld: 6.7 % — ABNORMAL HIGH (ref 4.6–6.5)

## 2012-03-16 ENCOUNTER — Other Ambulatory Visit: Payer: Self-pay | Admitting: Internal Medicine

## 2012-03-16 MED ORDER — FLUOXETINE HCL 10 MG PO CAPS: ORAL_CAPSULE | ORAL | Status: DC

## 2012-03-16 NOTE — Telephone Encounter (Signed)
refill fluoxetine cap 10mg  capsule --take one tablet by mouth daily #90--last wrt 08/25/11  #90 wt/2-refills

## 2012-03-16 NOTE — Telephone Encounter (Signed)
RX sent in

## 2012-03-28 ENCOUNTER — Telehealth: Payer: Self-pay | Admitting: Pulmonary Disease

## 2012-03-28 ENCOUNTER — Ambulatory Visit (INDEPENDENT_AMBULATORY_CARE_PROVIDER_SITE_OTHER): Payer: Medicare Other | Admitting: Pulmonary Disease

## 2012-03-28 ENCOUNTER — Encounter: Payer: Self-pay | Admitting: Pulmonary Disease

## 2012-03-28 VITALS — BP 124/68 | HR 78 | Temp 98.0°F | Ht 70.0 in | Wt 225.2 lb

## 2012-03-28 DIAGNOSIS — G4733 Obstructive sleep apnea (adult) (pediatric): Secondary | ICD-10-CM

## 2012-03-28 NOTE — Progress Notes (Signed)
  Subjective:    Patient ID: Albert Hawkins, male    DOB: 1940/07/11, 71 y.o.   MRN: [id number]  HPI 71/M  for FU of obstructive sleep apnea.  He has atrial fibrillation diabetes and hypertension. He has gained about 20 pounds in the last 2 years.  Epworth sleepiness score is 3/24.  He complains of inability to fall asleep. Sleep latency is about 30 minutes to 1.5 hours.  Bedtime is 10-11 PM. He has been on Prozac since November 2012 and he takes this is at bedtime. He is frequent awakenings one to 3 times every night, he takes Lasix for the last 2 years. He sleeps on his side on one pillow, he cannot lay on his back. Loud snoring was noted on a recent golfing trip by his buddies. He is out of bed at 6 to 8 AM, denies headaches or dryness of mouth.  There is no history suggestive of cataplexy, sleep paralysis or parasomnias .  He underwent ablation in February 2012 but atrial fibrillation recurred in 3 months. Reflux symptoms do wake him up from sleep on occasion.  >> prozac in daytime  Melatonin 5mg   No sleep apnea on study - but TST only 2.5 h & no supine sleep  There were 369 limb movements with a limb movements PLM index of 142 events per hour. The PLM arousal index was 10 events per hour.  had iron studies done this morning - nml satn.  He is taking melatonin 5 mg every night and he sleeps very well. he has only had 1 bad night in bout 5 weeks. 03/28/2012  Home study showed moderate OSA - 15 events /hour  Started on autoCPAP 5-12 cm, humidity, Download 11/13 showed AHI 10/h, pr 11 cm, good compliance wears cpap everynight. feeling rested during the day. waking up atleast 1-5 times a night. sometimes will wake up every 2 hrs. feels like cpap is helping. he has also helped him breathe better during the day Feels better rested, no daytime tiredness or sleepiness - nocturia + Still having runs of a fibn -last checked prior to CPAP  Review of Systems neg for any significant sore throat,  dysphagia, itching, sneezing, nasal congestion or excess/ purulent secretions, fever, chills, sweats, unintended wt loss, pleuritic or exertional cp, hempoptysis, orthopnea pnd or change in chronic leg swelling. Also denies presyncope, palpitations, heartburn, abdominal pain, nausea, vomiting, diarrhea or change in bowel or urinary habits, dysuria,hematuria, rash, arthralgias, visual complaints, headache, numbness weakness or ataxia.     Objective:   Physical Exam  Gen. Pleasant, obese, in no distress ENT - no lesions, no post nasal drip Neck: No JVD, no thyromegaly, no carotid bruits Lungs: no use of accessory muscles, no dullness to percussion, decreased without rales or rhonchi  Cardiovascular: Rhythm regular, heart sounds  normal, no murmurs or gallops, no peripheral edema Musculoskeletal: No deformities, no cyanosis or clubbing , no tremors       Assessment & Plan:

## 2012-03-28 NOTE — Assessment & Plan Note (Signed)
You are adjusting well to our CPAP machine We will increase pressure slightly to cut out residual events & rechk download in 4 weeks Adjust humidity as needed Benefits on a fibn discussed  Weight loss encouraged, compliance with goal of at least 4-6 hrs every night is the expectation. Advised against medications with sedative side effects Cautioned against driving when sleepy - understanding that sleepiness will vary on a day to day basis

## 2012-03-28 NOTE — Patient Instructions (Signed)
You are adjusting well to our CPAP machine We will increase pressure slightly to cut out residual events & rechk download in 4 weeks Adjust humidity as needed Weight loss encouraged

## 2012-03-31 ENCOUNTER — Ambulatory Visit (INDEPENDENT_AMBULATORY_CARE_PROVIDER_SITE_OTHER): Payer: Medicare Other

## 2012-03-31 ENCOUNTER — Encounter: Payer: Self-pay | Admitting: *Deleted

## 2012-03-31 VITALS — BP 118/76 | HR 67 | Wt 220.2 lb

## 2012-03-31 DIAGNOSIS — I4891 Unspecified atrial fibrillation: Secondary | ICD-10-CM

## 2012-03-31 DIAGNOSIS — Z7901 Long term (current) use of anticoagulants: Secondary | ICD-10-CM

## 2012-03-31 LAB — POCT INR: INR: 2

## 2012-03-31 NOTE — Patient Instructions (Signed)
No change, recheck PT/INR in 4 weeks (1 mg daily EXCEPT 2 mg on M/W/F)

## 2012-04-12 ENCOUNTER — Other Ambulatory Visit: Payer: Self-pay | Admitting: Internal Medicine

## 2012-04-12 MED ORDER — WARFARIN SODIUM 2 MG PO TABS: ORAL_TABLET | ORAL | Status: DC

## 2012-04-12 MED ORDER — METFORMIN HCL 500 MG PO TABS: ORAL_TABLET | ORAL | Status: DC

## 2012-04-12 NOTE — Telephone Encounter (Signed)
refills x 2 last ov 10.8.13 PT check & Acute visit--last A1C 11.7.13--both last fill 9.30.13 #90  1-Warfarin Sodium (Tab) 2 MG TAKE AS DIRECTED  2-MetFORMIN HCl (Tab) 500 MG TAKE 2 TABLETS TWICE A DAY WITH LARGEST MEALS

## 2012-04-12 NOTE — Telephone Encounter (Signed)
RXs sent.

## 2012-04-14 ENCOUNTER — Encounter: Payer: Self-pay | Admitting: Pulmonary Disease

## 2012-05-02 ENCOUNTER — Ambulatory Visit (INDEPENDENT_AMBULATORY_CARE_PROVIDER_SITE_OTHER): Payer: Medicare Other

## 2012-05-02 VITALS — BP 132/78 | HR 73 | Wt 224.6 lb

## 2012-05-02 DIAGNOSIS — Z7901 Long term (current) use of anticoagulants: Secondary | ICD-10-CM

## 2012-05-02 DIAGNOSIS — I4891 Unspecified atrial fibrillation: Secondary | ICD-10-CM

## 2012-05-02 LAB — POCT INR: INR: 2.5

## 2012-05-02 NOTE — Patient Instructions (Addendum)
Current dose 2 mg on M/W/F, 1 mg all other days. No change in diet, no missed doses, no unusual bleeding. NO CHANGE, recheck in 4 weeks

## 2012-05-16 ENCOUNTER — Telehealth: Payer: Self-pay | Admitting: Pulmonary Disease

## 2012-05-16 DIAGNOSIS — G4733 Obstructive sleep apnea (adult) (pediatric): Secondary | ICD-10-CM

## 2012-05-16 NOTE — Telephone Encounter (Signed)
Download 1/14 excellent usage Residual 11/h on avg pr 12 cm Change to 12 cm - pl send order to DME & rechk download in 

## 2012-05-17 NOTE — Telephone Encounter (Signed)
I spoke with patient about results and he verbalized understanding and had no questions Order sent.  

## 2012-05-23 ENCOUNTER — Other Ambulatory Visit: Payer: Self-pay | Admitting: Internal Medicine

## 2012-05-24 NOTE — Telephone Encounter (Signed)
a1c 250.00  

## 2012-05-26 ENCOUNTER — Encounter: Payer: Self-pay | Admitting: Pulmonary Disease

## 2012-05-26 ENCOUNTER — Ambulatory Visit (INDEPENDENT_AMBULATORY_CARE_PROVIDER_SITE_OTHER): Payer: Medicare Other | Admitting: Pulmonary Disease

## 2012-05-26 VITALS — BP 128/64 | HR 68 | Temp 98.7°F | Ht 70.0 in | Wt 228.4 lb

## 2012-05-26 DIAGNOSIS — G4733 Obstructive sleep apnea (adult) (pediatric): Secondary | ICD-10-CM

## 2012-05-26 NOTE — Patient Instructions (Signed)
Your CPAP is set at 12 Get back on melatonin 5 mg

## 2012-05-26 NOTE — Assessment & Plan Note (Signed)
On CPAP 12 Weight loss encouraged, compliance with goal of at least 4-6 hrs every night is the expectation. Advised against medications with sedative side effects Cautioned against driving when sleepy - understanding that sleepiness will vary on a day to day basis  OK to get back on melatonin 5mg  for sleep onset insomnia

## 2012-05-26 NOTE — Progress Notes (Signed)
  Subjective:    Patient ID: Albert Hawkins, male    DOB: [birth year], 72 y.o.   MRN: [id number]  HPI 71/M for FU of obstructive sleep apnea.  He has atrial fibrillation diabetes and hypertension. He has gained about 20 pounds in the last 2 years.  Epworth sleepiness score is 3/24.  He complains of inability to fall asleep. Sleep latency is about 30 minutes to 1.5 hours.  Bedtime is 10-11 PM. He has been on Prozac since November 2012 and he takes this is at bedtime. He is frequent awakenings one to 3 times every night, he takes Lasix for the last 2 years. He sleeps on his side on one pillow, he cannot lay on his back. Loud snoring was noted on a recent golfing trip by his buddies. He is out of bed at 6 to 8 AM, denies headaches or dryness of mouth.  He underwent ablation in February 2012 but atrial fibrillation recurred in 3 months. Reflux symptoms do wake him up from sleep on occasion.  >> prozac in daytime  Melatonin 5mg   No sleep apnea on study - but TST only 2.5 h & no supine sleep  There were 369 limb movements with a limb movements PLM index of 142 events per hour. The PLM arousal index was 10 events per hour.  had iron studies done this morning - nml satn.  He is taking melatonin 5 mg every night and he sleeps very well. he has only had 1 bad night in bout 5 weeks.  Home study 11/13 showed moderate OSA - 15 events /hour  Download 11/13  on autoCPAP 5-12 cmshowed AHI 10/h, pr 11 cm, good compliance  Still having runs of a fibn -last checked prior to CPAP  >> pr to 11 cm    05/26/2012 Download 1/14 excellent usage  Residual 11/h on avg pr 12 cm  Changed to 12 cm   Pt is wearing CPAP everynight. He has been on fixed pressure 12 cm x 2 weeks. Pt states it is taking him longer to go to sleep. he is sleeping sound when he goes to sleep but not sleeping as long. sometimes in the morings when he gets up he can't go back to sleep.  wears cpap everynight. feeling rested during the day. waking  up atleast 1-5 times a night. sometimes will wake up every 2 hrs. feels like cpap is helping. he has also helped him breathe better during the day  Feels better rested, no daytime tiredness or sleepiness - nocturia +   Melatonin worked well - he stopped using after 2 mnths  Review of Systems neg for any significant sore throat, dysphagia, itching, sneezing, nasal congestion or excess/ purulent secretions, fever, chills, sweats, unintended wt loss, pleuritic or exertional cp, hempoptysis, orthopnea pnd or change in chronic leg swelling. Also denies presyncope, palpitations, heartburn, abdominal pain, nausea, vomiting, diarrhea or change in bowel or urinary habits, dysuria,hematuria, rash, arthralgias, visual complaints, headache, numbness weakness or ataxia.     Objective:   Physical Exam Gen. Pleasant, obese, in no distress ENT - no lesions, no post nasal drip Neck: No JVD, no thyromegaly, no carotid bruits Lungs: no use of accessory muscles, no dullness to percussion, decreased without rales or rhonchi  Cardiovascular: Rhythm regular, heart sounds  normal, no murmurs or gallops, no peripheral edema Musculoskeletal: No deformities, no cyanosis or clubbing , no tremors         Assessment & Plan:

## 2012-06-03 ENCOUNTER — Other Ambulatory Visit: Payer: Self-pay | Admitting: Internal Medicine

## 2012-06-06 ENCOUNTER — Ambulatory Visit (INDEPENDENT_AMBULATORY_CARE_PROVIDER_SITE_OTHER): Payer: Medicare Other | Admitting: *Deleted

## 2012-06-06 VITALS — BP 130/70 | HR 82 | Wt 227.0 lb

## 2012-06-06 DIAGNOSIS — Z7901 Long term (current) use of anticoagulants: Secondary | ICD-10-CM

## 2012-06-06 DIAGNOSIS — I4891 Unspecified atrial fibrillation: Secondary | ICD-10-CM

## 2012-06-06 LAB — POCT INR: INR: 2.1

## 2012-06-06 NOTE — Patient Instructions (Signed)
Return to office in 4 weeks  New dosing: 3 mg today then resume 1 mg daily except 2 mg M,W,F

## 2012-06-22 ENCOUNTER — Telehealth: Payer: Self-pay | Admitting: Internal Medicine

## 2012-06-22 ENCOUNTER — Telehealth: Payer: Self-pay | Admitting: Pulmonary Disease

## 2012-06-22 MED ORDER — WARFARIN SODIUM 2 MG PO TABS: ORAL_TABLET | ORAL | Status: DC

## 2012-06-22 MED ORDER — METFORMIN HCL 500 MG PO TABS: ORAL_TABLET | ORAL | Status: DC

## 2012-06-22 NOTE — Telephone Encounter (Signed)
Residuals 12/h on CPAP 12 cm, good usage

## 2012-06-22 NOTE — Telephone Encounter (Signed)
refills x 2 Metformin & Warfarin  Requesting 90-day supply  1-MetFORMIN HCl (Tab) 500 MG TAKE 2 TABLETS TWICE A DAY WITH LARGEST MEALS last wrt 12.17.13 #360  2-Warfarin Sodium (Tab) 2 MG TAKE AS DIRECTED last wrt 12.17.13 #90

## 2012-06-23 NOTE — Telephone Encounter (Signed)
I spoke with patient about results and he verbalized understanding and had no questions 

## 2012-07-07 ENCOUNTER — Ambulatory Visit (INDEPENDENT_AMBULATORY_CARE_PROVIDER_SITE_OTHER): Payer: Medicare Other

## 2012-07-07 ENCOUNTER — Encounter: Payer: Self-pay | Admitting: Pulmonary Disease

## 2012-07-07 VITALS — BP 126/80 | HR 68 | Wt 225.0 lb

## 2012-07-07 DIAGNOSIS — I4891 Unspecified atrial fibrillation: Secondary | ICD-10-CM

## 2012-07-07 DIAGNOSIS — Z7901 Long term (current) use of anticoagulants: Secondary | ICD-10-CM

## 2012-07-07 NOTE — Patient Instructions (Signed)
Per MD, no change in current dose 2 mg on M/W/F, 1 mg all other days.  RECHECK in 4 weeks

## 2012-08-01 ENCOUNTER — Other Ambulatory Visit: Payer: Self-pay | Admitting: Internal Medicine

## 2012-08-01 ENCOUNTER — Ambulatory Visit (INDEPENDENT_AMBULATORY_CARE_PROVIDER_SITE_OTHER): Payer: Medicare Other | Admitting: *Deleted

## 2012-08-01 DIAGNOSIS — I441 Atrioventricular block, second degree: Secondary | ICD-10-CM

## 2012-08-01 LAB — PACEMAKER DEVICE OBSERVATION
AL THRESHOLD: 0.5 V
ATRIAL PACING PM: 18
BAMS-0001: 150 {beats}/min
DEVICE MODEL PM: 1285538
RV LEAD IMPEDENCE PM: 429 Ohm
RV LEAD THRESHOLD: 0.875 V
VENTRICULAR PACING PM: 99

## 2012-08-01 NOTE — Progress Notes (Signed)
PPM check 

## 2012-08-04 ENCOUNTER — Ambulatory Visit (INDEPENDENT_AMBULATORY_CARE_PROVIDER_SITE_OTHER): Payer: Medicare Other

## 2012-08-04 VITALS — BP 122/78 | HR 78 | Wt 224.0 lb

## 2012-08-04 DIAGNOSIS — I4891 Unspecified atrial fibrillation: Secondary | ICD-10-CM

## 2012-08-04 DIAGNOSIS — Z7901 Long term (current) use of anticoagulants: Secondary | ICD-10-CM

## 2012-08-04 LAB — POCT INR: INR: 2.2

## 2012-08-04 NOTE — Patient Instructions (Addendum)
Current dose: 2 mg M/W/F, 1 mg all other days New Regimen: Continue with same regimen, no change, RECHECK in 4 weeks

## 2012-08-15 ENCOUNTER — Ambulatory Visit (INDEPENDENT_AMBULATORY_CARE_PROVIDER_SITE_OTHER): Payer: Medicare Other | Admitting: Internal Medicine

## 2012-08-15 ENCOUNTER — Encounter: Payer: Self-pay | Admitting: Internal Medicine

## 2012-08-15 VITALS — BP 130/78 | HR 71 | Temp 98.1°F | Wt 224.2 lb

## 2012-08-15 DIAGNOSIS — J209 Acute bronchitis, unspecified: Secondary | ICD-10-CM

## 2012-08-15 MED ORDER — BENZONATATE 200 MG PO CAPS: 200.0000 mg | ORAL_CAPSULE | Freq: Three times a day (TID) | ORAL | Status: DC | PRN

## 2012-08-15 MED ORDER — DOXYCYCLINE HYCLATE 100 MG PO TABS: 100.0000 mg | ORAL_TABLET | Freq: Two times a day (BID) | ORAL | Status: DC

## 2012-08-15 NOTE — Patient Instructions (Addendum)
Check glucose once daily if possible Fasting or morning glucose recommended M, W, F, & Sun if possible. Goal= 100-150 Glucose 2 hours after breakfast Tues, after lunch Thurs & 2 hrs after eve meal Sat if possible. Goal = < 180 Repeat PT/INR in 4 days , 4/25

## 2012-08-15 NOTE — Progress Notes (Signed)
  Subjective:    Patient ID: Albert Hawkins, male    DOB: [birth year], 72 y.o.   MRN: [id number]  HPI Symptoms began late 4/18 or early 4/19 as a dry cough and sore throat. He's taking NyQuil, Delsym, and Chloraseptic with partial response.  With the cough he's had some epigastric soreness as well as some shortness of breath.  He has a history of getting bronchitis approximately 2 times a year. He's had no history of asthma  He was exposed to his daughter and grandchild with similar symptoms.      Review of Systems There were no significant extrinsic symptoms such as itchy, watery eyes, sneezing. He's had some dental pain; but he denies frontal headache, facial pain, otic pain, or otic discharge. There's been no associated wheezing with the cough.  He is on warfarin; his most recent PT/INR was 2.2.  He is not been checking his glucoses     Objective:   Physical Exam General appearance:good health ;well nourished; no acute distress or increased work of breathing is present.  No  lymphadenopathy about the head, neck, or axilla noted.   Eyes: No conjunctival inflammation or lid edema is present.  Ears:  External ear exam shows no significant lesions or deformities.  Otoscopic examination reveals some wax; tympanic membranes are intact bilaterally without bulging, retraction, inflammation or discharge.  Nose:  External nasal examination shows no deformity or inflammation. Nasal mucosa are pink and moist without lesions or exudates. No septal dislocation or deviation.No obstruction to airflow.   Oral exam: Dental hygiene is good; lips and gums are healthy appearing.There is uvular erythema ; no exudate noted.   Neck:  No deformities,  masses, or tenderness noted.     Heart:  Normal rate and regular rhythm. S1 and S2 normal without gallop, murmur, click, rub or other extra sounds. S4  Lungs:Chest clear to auscultation; no wheezes, rhonchi,rales ,or rubs present.No increased work of  breathing.    Extremities:  No cyanosis, edema, or clubbing  noted    Skin: Warm & dry          Assessment & Plan:  #1 acute bronchitis w/o bronchospasm Plan: See orders and recommendations

## 2012-08-16 ENCOUNTER — Encounter: Payer: Self-pay | Admitting: Internal Medicine

## 2012-08-18 ENCOUNTER — Other Ambulatory Visit: Payer: Self-pay

## 2012-08-18 ENCOUNTER — Ambulatory Visit (INDEPENDENT_AMBULATORY_CARE_PROVIDER_SITE_OTHER): Payer: Medicare Other | Admitting: *Deleted

## 2012-08-18 VITALS — BP 110/76 | HR 65 | Wt 220.0 lb

## 2012-08-18 DIAGNOSIS — Z7901 Long term (current) use of anticoagulants: Secondary | ICD-10-CM

## 2012-08-18 DIAGNOSIS — I4891 Unspecified atrial fibrillation: Secondary | ICD-10-CM

## 2012-08-18 DIAGNOSIS — E119 Type 2 diabetes mellitus without complications: Secondary | ICD-10-CM

## 2012-08-18 LAB — POCT INR: INR: 2.6

## 2012-08-18 MED ORDER — GLIMEPIRIDE 2 MG PO TABS: ORAL_TABLET | ORAL | Status: DC

## 2012-08-18 NOTE — Telephone Encounter (Signed)
RX sent, patient needs to have labs. Future orders placed

## 2012-08-18 NOTE — Patient Instructions (Addendum)
Return to office in 4 weeks  New dosing: 1 mg on today and tomorrow then resume 1 mg daily except 2 mg on M,W,F

## 2012-09-01 ENCOUNTER — Ambulatory Visit: Payer: Medicare Other

## 2012-09-15 ENCOUNTER — Ambulatory Visit (INDEPENDENT_AMBULATORY_CARE_PROVIDER_SITE_OTHER): Payer: Medicare Other | Admitting: *Deleted

## 2012-09-15 DIAGNOSIS — Z7901 Long term (current) use of anticoagulants: Secondary | ICD-10-CM

## 2012-09-15 LAB — POCT INR: INR: 2.1

## 2012-09-15 NOTE — Patient Instructions (Signed)
Return to office in 4 weeks  New dosing: 3 mg today then resume 1/2 tab daily except 2 mg M,W,F

## 2012-10-03 ENCOUNTER — Telehealth: Payer: Self-pay | Admitting: Internal Medicine

## 2012-10-03 ENCOUNTER — Other Ambulatory Visit: Payer: Self-pay | Admitting: Internal Medicine

## 2012-10-03 NOTE — Telephone Encounter (Signed)
Spoke with patient, patient informed he is overdue for an A1c(pending appointment 10/06/12). Patient verbalized understanding. Patient will be able to get a 3 month supply with an additional refill when next refill is due

## 2012-10-03 NOTE — Telephone Encounter (Signed)
Patient states he needs 90 day supply for Glimepiride. We sent #15, but he needs #45. [facility] [address]

## 2012-10-06 ENCOUNTER — Ambulatory Visit (INDEPENDENT_AMBULATORY_CARE_PROVIDER_SITE_OTHER): Payer: Medicare Other | Admitting: Internal Medicine

## 2012-10-06 ENCOUNTER — Encounter: Payer: Self-pay | Admitting: Internal Medicine

## 2012-10-06 VITALS — BP 138/80 | HR 60 | Temp 98.1°F | Resp 12 | Ht 69.0 in | Wt 222.6 lb

## 2012-10-06 DIAGNOSIS — E119 Type 2 diabetes mellitus without complications: Secondary | ICD-10-CM

## 2012-10-06 DIAGNOSIS — Z0000 Encounter for general adult medical examination without abnormal findings: Secondary | ICD-10-CM

## 2012-10-06 DIAGNOSIS — Z7901 Long term (current) use of anticoagulants: Secondary | ICD-10-CM

## 2012-10-06 DIAGNOSIS — K219 Gastro-esophageal reflux disease without esophagitis: Secondary | ICD-10-CM

## 2012-10-06 DIAGNOSIS — E785 Hyperlipidemia, unspecified: Secondary | ICD-10-CM

## 2012-10-06 DIAGNOSIS — I10 Essential (primary) hypertension: Secondary | ICD-10-CM

## 2012-10-06 DIAGNOSIS — I4891 Unspecified atrial fibrillation: Secondary | ICD-10-CM

## 2012-10-06 LAB — CBC WITH DIFFERENTIAL/PLATELET
Eosinophils Relative: 2 % (ref 0.0–5.0)
HCT: 44.5 % (ref 39.0–52.0)
Hemoglobin: 14.9 g/dL (ref 13.0–17.0)
Lymphs Abs: 1.9 10*3/uL (ref 0.7–4.0)
Monocytes Relative: 9.4 % (ref 3.0–12.0)
Neutro Abs: 4.3 10*3/uL (ref 1.4–7.7)
RBC: 5.06 Mil/uL (ref 4.22–5.81)
WBC: 7.1 10*3/uL (ref 4.5–10.5)

## 2012-10-06 LAB — BASIC METABOLIC PANEL
BUN: 22 mg/dL (ref 6–23)
CO2: 25 mEq/L (ref 19–32)
Chloride: 99 mEq/L (ref 96–112)
Creatinine, Ser: 1.4 mg/dL (ref 0.4–1.5)
GFR: 53.31 mL/min — ABNORMAL LOW (ref >60.00)
Glucose, Bld: 146 mg/dL — ABNORMAL HIGH (ref 70–99)

## 2012-10-06 LAB — POCT INR: INR: 2

## 2012-10-06 LAB — LIPID PANEL
Cholesterol: 168 mg/dL (ref 0–200)
Total CHOL/HDL Ratio: 4

## 2012-10-06 LAB — HEMOGLOBIN A1C: Hgb A1c MFr Bld: 7.5 % — ABNORMAL HIGH (ref 4.6–6.5)

## 2012-10-06 LAB — HEPATIC FUNCTION PANEL
ALT: 30 U/L (ref 0–53)
AST: 29 U/L (ref 0–37)
Albumin: 4.3 g/dL (ref 3.5–5.2)
Total Bilirubin: 1 mg/dL (ref 0.3–1.2)

## 2012-10-06 LAB — MICROALBUMIN / CREATININE URINE RATIO: Microalb Creat Ratio: 4.8 mg/g (ref 0.0–30.0)

## 2012-10-06 NOTE — Progress Notes (Signed)
Subjective:    Patient ID: GOLDEN GILREATH, male    DOB: Feb 03, 1941, 72 y.o.   MRN: [id number]  HPI Medicare Wellness Visit:  Psychosocial & medical history were reviewed as required by Medicare (abuse,antisocial behavioral risks,firearm risk).  Social history: caffeine: 1 cup of coffee per day, one soft drink 3-4 times per week , alcohol: none   ,  tobacco use: former smoker (from age 41 to 6) 21 pack year history    Exercise :  None Home & personal  safety / fall risk:  None per pt Limitations of activities of daily living: None Seatbelt  and smoke alarm use:Yes to both Power of Attorney/Living Will status : Yes to both Ophthalmology exam status : Oct 2013 Hearing evaluation status: never officially Orientation :oriented X 3 Memory & recall : recall all 3 words Spelling or math testing: accurate serial 7s x 5 Active depression / anxiety:  Controlled on prozac Foreign travel history :  Last trip in 26 to [place] and [place] Immunization status for Shingles /Flu/ PNA/ tetanus : Questionable if up to date on Shingles. UTD on all other vaccines Transfusion history:  Never Preventive health surveillance status of colonoscopy- never,  Dental care:  Every 6 months Chart reviewed &  Updated. Active issues reviewed & addressed.        Review of Systems HYPERTENSION: Disease Monitoring: Only checks if believes it may be elevated Blood pressure range- not known to patient  Chest pain, palpitations- None Dyspnea-None Medications: Compliance- Reports no missed doses  Lightheadedness,Syncope-  none Edema- with immobilization while traveling and prolonged walking or standing  DIABETES: Disease Monitoring: checks 3-4 times per week Blood Sugar average- 140  Polyuria/phagia/dipsia- polyuria, believes may be related to diuretic, no polydipsia or polyphagia       Visual problems- No blurry, double, or loss of vision Medications: Compliance- Reports no skipped doses  Hypoglycemic  symptoms- None, has been aware of hypoglycemia in the past  HYPERLIPIDEMIA: Disease Monitoring: See symptoms for Hypertension Medications: Compliance- Reports no missed doses  Abd pain, bowel changes- No   Muscle aches- None          Objective:   Physical Exam Gen.:  well-nourished in appearance; central weight exces. Alert, appropriate and cooperative throughout exam.Appears stated age  Head: Normocephalic without obvious abnormalities;  Male pattern alopecia  Eyes: No corneal or conjunctival inflammation noted.  Extraocular motion intact. Vision grossly normal with lenses Ears: External  ear exam reveals no significant lesions or deformities. Canals with significant amount of serumen .TMs normal. Hearing is grossly normal bilaterally. Nose: External nasal exam reveals no deformity or inflammation. Nasal mucosa are pink and moist. No lesions or exudates noted. Septum normal  Mouth: Oral mucosa and oropharynx reveal no lesions or exudates. Teeth in good repair. Neck: No deformities, masses, or tenderness noted. FROM. Thyroid normal. Lungs: Normal respiratory effort; chest expands symmetrically. Lungs are clear to auscultation without rales, wheezes, or increased work of breathing. Heart: Normal rate and rhythm. Normal S1 and S2. No gallop, click, or rub.No murmur. Abdomen: Bowel sounds normal; abdomen soft and nontender. No masses, organomegaly or hernias noted.Protuberant Genitalia: Genitalia normal except for left varices. Prostate is normal without enlargement, asymmetry, nodularity, or induration.                                   Musculoskeletal/extremities: No clubbing or  cyanosis noted. Trace ankle edema.Range of motion  normal .Tone & strength  Normal. Joints normal . Nail health good. Able to lie down & sit up w/o help. Negative SLR bilaterally Vascular: Carotid, radial artery, dorsalis pedis and  posterior tibial pulses are full and equal. No bruits present. Neurologic:  Alert and oriented x3. Deep tendon reflexes symmetrical and normal.         Skin: Intact without suspicious lesions or rashes. Lymph: No cervical, axillary, or inguinal lymphadenopathy present. Psych: Mood and affect are normal. Normally interactive                                                                                      Assessment & Plan:  #1 Medicare Wellness Exam; criteria met ; data entered #2 Problem List/Diagnoses reviewed Plan:  Assessments made/ Orders entered

## 2012-10-06 NOTE — Patient Instructions (Addendum)
As per the Standard of Care , screening Colonoscopy recommended @ 50 & every 5-10 years thereafter . More frequent monitor would be dictated by family history or findings @ Colonoscopy. Please complete and return stool cards; these will determine whether there is any gastrointestinal bleeding risk. PT/INR is therapeutic. No change in warfarin dose; repeat PT/INR in 4 weeks

## 2012-10-12 ENCOUNTER — Other Ambulatory Visit: Payer: Self-pay | Admitting: *Deleted

## 2012-10-12 MED ORDER — LOSARTAN POTASSIUM 100 MG PO TABS: 100.0000 mg | ORAL_TABLET | Freq: Every day | ORAL | Status: DC

## 2012-10-12 MED ORDER — WARFARIN SODIUM 2 MG PO TABS: ORAL_TABLET | ORAL | Status: DC

## 2012-10-12 MED ORDER — METFORMIN HCL 500 MG PO TABS: ORAL_TABLET | ORAL | Status: DC

## 2012-10-12 MED ORDER — FENOFIBRATE MICRONIZED 134 MG PO CAPS: ORAL_CAPSULE | ORAL | Status: DC

## 2012-10-12 NOTE — Telephone Encounter (Signed)
Rx sent 

## 2012-10-17 ENCOUNTER — Other Ambulatory Visit: Payer: Self-pay | Admitting: *Deleted

## 2012-10-17 MED ORDER — PRAVASTATIN SODIUM 20 MG PO TABS: ORAL_TABLET | ORAL | Status: DC

## 2012-10-17 NOTE — Telephone Encounter (Signed)
Rx sent 

## 2012-10-24 ENCOUNTER — Telehealth: Payer: Self-pay | Admitting: Internal Medicine

## 2012-10-24 MED ORDER — GLIMEPIRIDE 2 MG PO TABS: ORAL_TABLET | ORAL | Status: DC

## 2012-10-24 NOTE — Telephone Encounter (Signed)
Patient called stating he needs a 90 day supply of glimepiride with 3 refills sent to [facility].

## 2012-10-24 NOTE — Telephone Encounter (Signed)
Refill done.  

## 2012-11-03 ENCOUNTER — Ambulatory Visit (INDEPENDENT_AMBULATORY_CARE_PROVIDER_SITE_OTHER): Payer: Medicare Other | Admitting: *Deleted

## 2012-11-03 VITALS — BP 126/76 | HR 68 | Temp 98.4°F | Wt 220.0 lb

## 2012-11-03 DIAGNOSIS — Z7901 Long term (current) use of anticoagulants: Secondary | ICD-10-CM

## 2012-11-03 DIAGNOSIS — I4891 Unspecified atrial fibrillation: Secondary | ICD-10-CM

## 2012-11-03 NOTE — Patient Instructions (Addendum)
Continue current dose and return in 1 month.

## 2012-11-04 ENCOUNTER — Telehealth: Payer: Self-pay | Admitting: Internal Medicine

## 2012-11-04 MED ORDER — GLIMEPIRIDE 2 MG PO TABS: ORAL_TABLET | ORAL | Status: DC

## 2012-11-04 NOTE — Telephone Encounter (Signed)
Rx resubmitted for #45 1 with ref# attached. Discuss with patient

## 2012-11-04 NOTE — Telephone Encounter (Signed)
Patient is calling upset because he does not feel his Amaryl Rx was sent to his pharmacy correctly. He is wanting [place] to call his mail order pharmacy at [phone] using Ref#: [id number] and to have them change his prescription to a 90-day supply. Advised patient that 90 tablets were sent to his pharmacy but he does not think it is correct. Please advise.

## 2012-12-01 ENCOUNTER — Ambulatory Visit (INDEPENDENT_AMBULATORY_CARE_PROVIDER_SITE_OTHER): Payer: Medicare Other

## 2012-12-01 VITALS — BP 138/78 | HR 60 | Temp 98.2°F | Wt 220.4 lb

## 2012-12-01 DIAGNOSIS — I4891 Unspecified atrial fibrillation: Secondary | ICD-10-CM

## 2012-12-01 LAB — POCT INR: INR: 2.4

## 2012-12-01 NOTE — Patient Instructions (Addendum)
Continue with same dosing M-W-F 2mg ; 1mg  all other days. If any changes in dosing we will call.  I enjoyed meeting you!!

## 2012-12-19 ENCOUNTER — Other Ambulatory Visit (INDEPENDENT_AMBULATORY_CARE_PROVIDER_SITE_OTHER): Payer: Medicare Other

## 2012-12-19 DIAGNOSIS — Z1289 Encounter for screening for malignant neoplasm of other sites: Secondary | ICD-10-CM

## 2012-12-19 DIAGNOSIS — Z0000 Encounter for general adult medical examination without abnormal findings: Secondary | ICD-10-CM

## 2012-12-19 LAB — HEMOCCULT GUIAC POC 1CARD (OFFICE)
Card #2 Fecal Occult Blod, POC: NEGATIVE
Card #3 Fecal Occult Blood, POC: NEGATIVE
Fecal Occult Blood, POC: NEGATIVE

## 2013-01-03 ENCOUNTER — Other Ambulatory Visit: Payer: Self-pay | Admitting: *Deleted

## 2013-01-03 MED ORDER — WARFARIN SODIUM 2 MG PO TABS: ORAL_TABLET | ORAL | Status: DC

## 2013-01-03 MED ORDER — HYDROCHLOROTHIAZIDE 12.5 MG PO CAPS: 12.5000 mg | ORAL_CAPSULE | Freq: Every day | ORAL | Status: DC

## 2013-01-03 MED ORDER — FLUOXETINE HCL 10 MG PO CAPS: ORAL_CAPSULE | ORAL | Status: DC

## 2013-01-03 NOTE — Telephone Encounter (Signed)
Rx was refilled for HCTZ, fluoxetine, warfarin.  Ag cma

## 2013-01-05 ENCOUNTER — Ambulatory Visit (INDEPENDENT_AMBULATORY_CARE_PROVIDER_SITE_OTHER): Payer: Medicare Other

## 2013-01-05 VITALS — BP 128/80 | HR 65 | Temp 97.5°F | Wt 222.2 lb

## 2013-01-05 DIAGNOSIS — I4891 Unspecified atrial fibrillation: Secondary | ICD-10-CM

## 2013-01-05 LAB — POCT INR: INR: 2

## 2013-01-05 NOTE — Patient Instructions (Addendum)
Please continue the same dosing 2mg  M-W-F and 1 mg all other days. Dr Alwyn Ren will review and if there are any changes we will call you.  Recheck in one month. Good to see you today!!

## 2013-01-09 ENCOUNTER — Other Ambulatory Visit: Payer: Self-pay | Admitting: *Deleted

## 2013-01-09 MED ORDER — METFORMIN HCL 500 MG PO TABS: ORAL_TABLET | ORAL | Status: DC

## 2013-01-09 NOTE — Telephone Encounter (Signed)
Rx was refilled for metformin 500 mg.   Ag cma

## 2013-02-02 ENCOUNTER — Ambulatory Visit (INDEPENDENT_AMBULATORY_CARE_PROVIDER_SITE_OTHER): Payer: Medicare Other | Admitting: *Deleted

## 2013-02-02 VITALS — BP 136/82 | HR 62 | Temp 98.1°F | Wt 220.4 lb

## 2013-02-02 DIAGNOSIS — I4891 Unspecified atrial fibrillation: Secondary | ICD-10-CM

## 2013-02-02 DIAGNOSIS — Z7901 Long term (current) use of anticoagulants: Secondary | ICD-10-CM

## 2013-02-02 LAB — POCT INR: INR: 2.7

## 2013-02-02 NOTE — Patient Instructions (Addendum)
2 mg M/W/F 1mg  all other days

## 2013-03-02 ENCOUNTER — Other Ambulatory Visit: Payer: Self-pay

## 2013-03-03 ENCOUNTER — Encounter: Payer: Self-pay | Admitting: Internal Medicine

## 2013-03-03 ENCOUNTER — Ambulatory Visit (INDEPENDENT_AMBULATORY_CARE_PROVIDER_SITE_OTHER): Payer: Medicare Other | Admitting: Internal Medicine

## 2013-03-03 VITALS — BP 129/76 | HR 65 | Ht 70.0 in | Wt 222.8 lb

## 2013-03-03 DIAGNOSIS — I442 Atrioventricular block, complete: Secondary | ICD-10-CM

## 2013-03-03 DIAGNOSIS — I495 Sick sinus syndrome: Secondary | ICD-10-CM

## 2013-03-03 DIAGNOSIS — I4891 Unspecified atrial fibrillation: Secondary | ICD-10-CM

## 2013-03-03 DIAGNOSIS — I441 Atrioventricular block, second degree: Secondary | ICD-10-CM

## 2013-03-03 DIAGNOSIS — I10 Essential (primary) hypertension: Secondary | ICD-10-CM

## 2013-03-03 DIAGNOSIS — Z950 Presence of cardiac pacemaker: Secondary | ICD-10-CM

## 2013-03-03 LAB — MDC_IDC_ENUM_SESS_TYPE_INCLINIC
Battery Voltage: 2.79 V
Brady Statistic RA Percent Paced: 17 %
Brady Statistic RV Percent Paced: 99 %
Lead Channel Setting Pacing Amplitude: 1.125
Lead Channel Setting Pacing Amplitude: 2 V
Lead Channel Setting Pacing Pulse Width: 0.4 ms

## 2013-03-03 NOTE — Progress Notes (Signed)
PCP: Marga Melnick, MD  The patient presents today for routine electrophysiology followup.  Since last being seen in our clinic, the patient reports doing very well.  His afib has progressed to 100% but he is asymptomatic presently.   He remains active and continues to play golf several times per week without limitation. Today, he denies symptoms of palpitations, chest pain, shortness of breath, orthopnea, PND, lower extremity edema, dizziness, presyncope, syncope, or neurologic sequela.  The patient feels that he is tolerating medications without difficulties and is otherwise without complaint today.   Past Medical History  Diagnosis Date  . Mobitz (type) II atrioventricular block     S/P  PPM (SJM) by Dr Johney Frame  . Benign essential HTN   . Neoplasm of uncertain behavior of skin   . Atrial fibrillation     paroxysmal, on coumadin  . Diabetes mellitus type II   . Hearing loss, conductive, bilateral   . 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 Hyperlipidemia   . Sleep disorder   . 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 PVC's (premature ventricular contractions)    Past Surgical History  Procedure Laterality Date  . Hand surgery      Right thumb amputated and reattached   . Skin cancer excision      above nose   . Pacemaker placement      06/12/2008, Dr. Hillis Range  . Hernia repair      Umbilical  . 3 fatty tumors      benign  . No colonoscopy      "did not want one"    Current Outpatient Prescriptions  Medication Sig Dispense Refill  . acetaminophen (TYLENOL ARTHRITIS PAIN) 650 MG CR tablet Take 650 mg by mouth. 1 by mouth daily      . cetirizine (ZYRTEC) 10 MG tablet Take 10 mg by mouth daily.       . fenofibrate micronized (LOFIBRA) 134 MG capsule TAKE 1 CAPSULE DAILY  90 capsule  1  . FLUoxetine (PROZAC) 10 MG capsule TAKE 1 CAPSULE DAILY  90 capsule  2  . glimepiride (AMARYL) 2 MG tablet TAKE 1/2 TABLET BY MOUTH  EVERY MORNING BEFORE BREAKFAST  45 tablet  1  . hydrochlorothiazide (MICROZIDE) 12.5 MG capsule Take 1 capsule (12.5 mg total) by mouth daily.  90 capsule  3  . losartan (COZAAR) 100 MG tablet Take 1 tablet (100 mg total) by mouth daily.  90 tablet  1  . metFORMIN (GLUCOPHAGE) 500 MG tablet TAKE 2 TABLETS TWICE A DAY WITH LARGEST MEALS  360 tablet  0  . Multiple Vitamin (MULTIVITAMIN) tablet Take 1 tablet by mouth daily.        . ONE TOUCH ULTRA TEST test strip CHECK BLOOD SUGAR once daily as directed  100 each  3  . pravastatin (PRAVACHOL) 20 MG tablet TAKE 1 TABLET AT BEDTIME (LABS DUE)  90 tablet  1  . warfarin (COUMADIN) 2 MG tablet TAKE AS DIRECTED  90 tablet  0   No current facility-administered medications for this visit.    Allergies  Allergen Reactions  . Amoxicillin     REACTION: nausea and diarrhea  . Ramipril     Cough only  . Tramadol Hcl     REACTION: lightheaded    History   Social History  . Marital Status: Married    Spouse Name: N/A  Number of Children: N/A  . Years of Education: N/A   Occupational History  . Not on file.   Social History Main Topics  . Smoking status: Former Smoker -- 2.00 packs/day for 10 years    Types: Cigarettes    Quit date: 04/27/1985  . Smokeless tobacco: Never Used     Comment: smoked age [age], maximum regular consumption up to 1 ppd  . Alcohol Use: No  . Drug Use: No  . Sexual Activity: Not on file   Other Topics Concern  . Not on file   Social History Narrative  . No narrative on file    Family History  Problem Relation Age of Onset  . Heart attack Paternal Grandfather 70  . Diabetes Paternal Uncle   . Diabetes Maternal Grandfather   . Skin cancer Father   . Emphysema Father   . Coronary artery disease Father     stent  . Diabetes Maternal Aunt   . Diabetes Maternal Uncle    Physical Exam: Filed Vitals:   03/03/13 1142  BP: 129/76  Pulse: 65  Height: 5\' 10"  (1.778 m)  Weight: 222 lb 12.8 oz (101.061  kg)    GEN- The patient is well appearing, alert and oriented x 3 today.   Head- normocephalic, atraumatic Eyes-  Sclera clear, conjunctiva pink  Ears- hearing intact Oropharynx- clear Neck- supple, no JVP Lymph- no cervical lymphadenopathy Lungs- Clear to ausculation bilaterally, normal work of breathing Chest- pacemaker pocket is well healed Heart- Regular rate and rhythm (paced) GI- soft, NT, ND, + BS Extremities- no clubbing, cyanosis, or edema MS- no significant deformity or atrophy Skin- no rash or lesion Psych- euthymic mood, full affect Neuro- strength and sensation are intact  Pacemaker interrogation- reviewed in detail today,  See PACEART report ekg today reveals afib, V pacing at 65 bpm  Assessment and Plan:  1. Complete heart block Previously mobitz II now progressed to complete heart block Normal pacemaker function See Pace Art report No changes today  2. Persistent afib He is now in afib all of the time but remains asymptomatic.  Continue long term anticoagulation Consider reprogramming VVIR upon return I dont think that he would benefit from AAD therapy at this point.  3. HTN Stable No change required today  Return to the device clinic in 6 months I will see again in 1 year

## 2013-03-03 NOTE — Patient Instructions (Signed)
Your next device clinic appointment on Sep 04, 2013 @ 11:00am  Your physician recommends that you schedule a follow-up appointment in: 12 months with Dr.Allred

## 2013-03-06 ENCOUNTER — Ambulatory Visit (INDEPENDENT_AMBULATORY_CARE_PROVIDER_SITE_OTHER): Payer: Medicare Other | Admitting: *Deleted

## 2013-03-06 VITALS — BP 122/80 | HR 59 | Temp 97.6°F | Wt 221.4 lb

## 2013-03-06 DIAGNOSIS — I441 Atrioventricular block, second degree: Secondary | ICD-10-CM

## 2013-03-06 DIAGNOSIS — Z7901 Long term (current) use of anticoagulants: Secondary | ICD-10-CM

## 2013-03-06 DIAGNOSIS — I4891 Unspecified atrial fibrillation: Secondary | ICD-10-CM

## 2013-03-06 NOTE — Patient Instructions (Signed)
2 mg M/W/F, 1 mg all other days Recheck in 4 weeks

## 2013-04-04 ENCOUNTER — Other Ambulatory Visit: Payer: Self-pay | Admitting: *Deleted

## 2013-04-04 MED ORDER — LOSARTAN POTASSIUM 100 MG PO TABS: 100.0000 mg | ORAL_TABLET | Freq: Every day | ORAL | Status: DC

## 2013-04-04 MED ORDER — METFORMIN HCL 500 MG PO TABS: ORAL_TABLET | ORAL | Status: DC

## 2013-04-04 MED ORDER — GLIMEPIRIDE 2 MG PO TABS: ORAL_TABLET | ORAL | Status: DC

## 2013-04-04 MED ORDER — FENOFIBRATE MICRONIZED 134 MG PO CAPS: ORAL_CAPSULE | ORAL | Status: DC

## 2013-04-04 MED ORDER — WARFARIN SODIUM 2 MG PO TABS: ORAL_TABLET | ORAL | Status: DC

## 2013-04-04 MED ORDER — PRAVASTATIN SODIUM 20 MG PO TABS: ORAL_TABLET | ORAL | Status: DC

## 2013-04-04 NOTE — Telephone Encounter (Signed)
Losartan, Fenofibrate, and Glimepiride refilled per protocol

## 2013-04-04 NOTE — Telephone Encounter (Signed)
Pravastatin, Warfarin and Metformin refilled per protocol. JG//CMA

## 2013-04-10 ENCOUNTER — Ambulatory Visit (INDEPENDENT_AMBULATORY_CARE_PROVIDER_SITE_OTHER): Payer: Medicare Other | Admitting: *Deleted

## 2013-04-10 VITALS — BP 130/78 | HR 61 | Temp 98.4°F | Wt 223.0 lb

## 2013-04-10 DIAGNOSIS — Z7901 Long term (current) use of anticoagulants: Secondary | ICD-10-CM

## 2013-04-10 DIAGNOSIS — I4891 Unspecified atrial fibrillation: Secondary | ICD-10-CM

## 2013-04-10 LAB — POCT INR: INR: 3.1

## 2013-04-10 NOTE — Patient Instructions (Signed)
Take 1 mg today then resume schedule of 2 mg M/W/F, 1 mg all other days Recheck in 4 weeks  Taking Aleve along with warfarin will greatly increase your risk of GI bleeding. Please use with caution.

## 2013-04-10 NOTE — Telephone Encounter (Signed)
error 

## 2013-05-08 ENCOUNTER — Ambulatory Visit: Payer: Medicare Other

## 2013-05-09 ENCOUNTER — Encounter: Payer: Self-pay | Admitting: Internal Medicine

## 2013-05-09 ENCOUNTER — Ambulatory Visit (INDEPENDENT_AMBULATORY_CARE_PROVIDER_SITE_OTHER): Payer: Medicare Other | Admitting: Internal Medicine

## 2013-05-09 VITALS — BP 162/75 | HR 69 | Temp 98.0°F | Resp 18 | Wt 224.0 lb

## 2013-05-09 DIAGNOSIS — J209 Acute bronchitis, unspecified: Secondary | ICD-10-CM

## 2013-05-09 DIAGNOSIS — Z7901 Long term (current) use of anticoagulants: Secondary | ICD-10-CM

## 2013-05-09 LAB — POCT INR: INR: 2.5

## 2013-05-09 MED ORDER — HYDROCODONE-HOMATROPINE 5-1.5 MG/5ML PO SYRP: 5.0000 mL | ORAL_SOLUTION | Freq: Four times a day (QID) | ORAL | Status: DC | PRN

## 2013-05-09 NOTE — Patient Instructions (Addendum)
Plain Mucinex (NOT D) for thick secretions ;force NON dairy fluids .   Nasal cleansing in the shower as discussed with lather of mild shampoo.After 10 seconds wash off lather while  exhaling through nostrils. Make sure that all residual soap is removed to prevent irritation.  Nasacort AQ OTC 1 spray in each nostril twice a day as needed. Use the "crossover" technique into opposite nostril spraying toward opposite ear @ 45 degree angle, not straight up into nostril.  Use a Neti pot daily only  as needed for significant sinus congestion; going from open side to congested side . Plain Allegra (NOT D )  160 daily , Loratidine 10 mg , OR Zyrtec 10 mg @ bedtime  as needed for itchy eyes & sneezing.   PT/INR is therapeutic. No change in warfarin dose; repeat PT/INR in 4 weeks

## 2013-05-09 NOTE — Progress Notes (Signed)
   Subjective:    Patient ID: Albert Hawkins, male    DOB: [birth year], 73 y.o.   MRN: [id number]  HPI Presents with sore throat and cough x 5 days, mild HA intermittent throughout course of presenting symptoms. Sick contact: wife had same symptoms one week earlier. Sore throat preceded cough; scant white, thick mucus from chest. Denies hemoptysis,night sweats. Reports feeling febrile on Sunday. Cough worsening; Alleve 1 tab today for soreness from coughing. Delsum at 1000 today, reports some relief with med. Denies sinus pain; reports some PND.Myalgias & arthralgias 11/9 only.  He took flu shot.     Review of Systems  Constitutional: Positive for fever and fatigue.  HENT: Positive for congestion, postnasal drip and sore throat. Negative for ear discharge and rhinorrhea.   Respiratory: Positive for cough and shortness of breath.   Cardiovascular: Negative for chest pain and leg swelling.  Neurological: Positive for headaches.     Objective:   Physical Exam General appearance:adequately nourished; no acute distress or increased work of breathing is present.  No  lymphadenopathy about the head, neck, or axilla noted.   Eyes: No conjunctival inflammation or lid edema is present.   Ears:  External ear exam shows no significant lesions or deformities.  Otoscopic examination reveals some wax but tympanic membranes are intact bilaterally without bulging, retraction, inflammation or discharge.  Nose:  External nasal examination shows no deformity or inflammation. Nasal mucosa are pink and moist without lesions or exudates. No septal dislocation or deviation.No obstruction to airflow.   Oral exam: Dental hygiene is good; lips and gums are healthy appearing.There is no uvular erythema or exudate noted.   Neck:  No deformities,  masses, or tenderness noted.   Supple with full range of motion without pain.   Heart:  Slow rate and irregular rhythm. S1 and S2 normal without gallop, murmur, click,  rub or other extra sounds.   Lungs: Minor  rales  present.No increased work of breathing.  Dry cough  Extremities:  No cyanosis, edema, or clubbing  noted    Skin: Warm & dry . Facial plethora         Assessment & Plan:  The CENTOR criteria for pharyngitis which include fever, pharyngeal exudate, tender cervical lymphadenopathy, and absence of cough were assessed. #1 acute bronchitis w/o bronchospasm #2 rhinitis, mild Plan: See orders and recommendations

## 2013-05-09 NOTE — Progress Notes (Signed)
Pre-visit discussion using our clinic review tool, as applicable. No additional management support is needed unless otherwise documented below in the visit note.  

## 2013-05-17 ENCOUNTER — Encounter: Payer: Self-pay | Admitting: Internal Medicine

## 2013-05-17 ENCOUNTER — Telehealth: Payer: Self-pay | Admitting: Internal Medicine

## 2013-05-17 NOTE — Telephone Encounter (Signed)
Patient states that he has finished his Hydrocodone cough syrup but still has a cough that worsens when he moves around a lot. He wants to know if he can have the cough syrup that Dr. Linna Darner rx'd back on 08/15/12. He believes it was benzonatate (TESSALON) 200 MG capsule that worked really well for him. States that they look like pearls. Please advise.

## 2013-05-17 NOTE — Telephone Encounter (Signed)
Please advise. JG//CMA

## 2013-05-18 MED ORDER — BENZONATATE 200 MG PO CAPS: 200.0000 mg | ORAL_CAPSULE | Freq: Three times a day (TID) | ORAL | Status: DC | PRN

## 2013-05-18 NOTE — Telephone Encounter (Signed)
Rx sent to the pharmacy by e-script.  Pt aware.//AB/CMA 

## 2013-05-18 NOTE — Telephone Encounter (Signed)
Message copied by Harl Bowie on Thu May 18, 2013  1:38 PM ------      Message from: Hendricks Limes      Created: Thu May 18, 2013  1:01 PM       See My Chart message please      Tessalon 200 mg tid prn #15 ------

## 2013-05-18 NOTE — Telephone Encounter (Signed)
Rx sent to the pharmacy.//AB/CMA 

## 2013-06-05 ENCOUNTER — Ambulatory Visit: Payer: Medicare Other

## 2013-06-06 ENCOUNTER — Ambulatory Visit (INDEPENDENT_AMBULATORY_CARE_PROVIDER_SITE_OTHER): Payer: Medicare Other | Admitting: General Practice

## 2013-06-06 DIAGNOSIS — I4891 Unspecified atrial fibrillation: Secondary | ICD-10-CM

## 2013-06-06 DIAGNOSIS — Z5181 Encounter for therapeutic drug level monitoring: Secondary | ICD-10-CM | POA: Insufficient documentation

## 2013-06-06 LAB — POCT INR: INR: 2.4

## 2013-06-06 NOTE — Progress Notes (Signed)
Pre-visit discussion using our clinic review tool. No additional management support is needed unless otherwise documented below in the visit note.  

## 2013-07-04 ENCOUNTER — Ambulatory Visit (INDEPENDENT_AMBULATORY_CARE_PROVIDER_SITE_OTHER): Payer: Medicare Other | Admitting: General Practice

## 2013-07-04 DIAGNOSIS — I4891 Unspecified atrial fibrillation: Secondary | ICD-10-CM

## 2013-07-04 LAB — POCT INR: INR: 2.5

## 2013-07-04 NOTE — Progress Notes (Signed)
Pre visit review using our clinic review tool, if applicable. No additional management support is needed unless otherwise documented below in the visit note. 

## 2013-07-31 ENCOUNTER — Ambulatory Visit (INDEPENDENT_AMBULATORY_CARE_PROVIDER_SITE_OTHER): Payer: Medicare Other | Admitting: General Practice

## 2013-07-31 DIAGNOSIS — Z5181 Encounter for therapeutic drug level monitoring: Secondary | ICD-10-CM

## 2013-07-31 DIAGNOSIS — I4891 Unspecified atrial fibrillation: Secondary | ICD-10-CM

## 2013-07-31 LAB — POCT INR: INR: 2.7

## 2013-07-31 NOTE — Progress Notes (Signed)
Pre visit review using our clinic review tool, if applicable. No additional management support is needed unless otherwise documented below in the visit note. 

## 2013-09-01 ENCOUNTER — Encounter: Payer: Self-pay | Admitting: Family Medicine

## 2013-09-01 ENCOUNTER — Ambulatory Visit (INDEPENDENT_AMBULATORY_CARE_PROVIDER_SITE_OTHER): Payer: Medicare Other | Admitting: Family Medicine

## 2013-09-01 VITALS — BP 128/66 | HR 62 | Temp 97.8°F | Ht 70.0 in | Wt 217.0 lb

## 2013-09-01 DIAGNOSIS — J209 Acute bronchitis, unspecified: Secondary | ICD-10-CM

## 2013-09-01 MED ORDER — BENZONATATE 200 MG PO CAPS: 200.0000 mg | ORAL_CAPSULE | Freq: Three times a day (TID) | ORAL | Status: DC | PRN

## 2013-09-01 MED ORDER — HYDROCODONE-HOMATROPINE 5-1.5 MG/5ML PO SYRP: 5.0000 mL | ORAL_SOLUTION | Freq: Four times a day (QID) | ORAL | Status: DC | PRN

## 2013-09-01 MED ORDER — DOXYCYCLINE HYCLATE 100 MG PO CAPS
100.0000 mg | ORAL_CAPSULE | Freq: Two times a day (BID) | ORAL | Status: AC
Start: 2013-09-01 — End: 2013-09-11

## 2013-09-01 NOTE — Progress Notes (Signed)
   Subjective:    Patient ID: Albert Hawkins, male    DOB: 28-Sep-1940, 73 y.o.   MRN: [id number]  HPI Here for one week of chest congestion and a dry cough. No fever.    Review of Systems  Constitutional: Negative.   HENT: Negative.   Eyes: Negative.   Respiratory: Positive for cough and chest tightness. Negative for wheezing.        Objective:   Physical Exam  Constitutional: He appears well-developed and well-nourished.  Pulmonary/Chest: Effort normal. No respiratory distress. He has no rales.  Scattered rhonchi           Assessment & Plan:  Add Mucinex

## 2013-09-01 NOTE — Progress Notes (Signed)
Pre visit review using our clinic review tool, if applicable. No additional management support is needed unless otherwise documented below in the visit note. 

## 2013-09-04 ENCOUNTER — Ambulatory Visit (INDEPENDENT_AMBULATORY_CARE_PROVIDER_SITE_OTHER): Payer: Medicare Other | Admitting: *Deleted

## 2013-09-04 ENCOUNTER — Other Ambulatory Visit: Payer: Self-pay

## 2013-09-04 DIAGNOSIS — I4891 Unspecified atrial fibrillation: Secondary | ICD-10-CM

## 2013-09-04 DIAGNOSIS — I442 Atrioventricular block, complete: Secondary | ICD-10-CM

## 2013-09-04 LAB — MDC_IDC_ENUM_SESS_TYPE_INCLINIC
Battery Impedance: 1000 Ohm
Battery Voltage: 2.79 V
Date Time Interrogation Session: 20150511112336
Implantable Pulse Generator Model: 5826
Implantable Pulse Generator Serial Number: 1285538
Lead Channel Impedance Value: 461 Ohm
Lead Channel Pacing Threshold Amplitude: 1 V
Lead Channel Pacing Threshold Pulse Width: 0.4 ms
Lead Channel Sensing Intrinsic Amplitude: 12 mV
Lead Channel Sensing Intrinsic Amplitude: 2.4 mV
Lead Channel Setting Pacing Amplitude: 2 V
Lead Channel Setting Pacing Pulse Width: 0.4 ms
Lead Channel Setting Sensing Sensitivity: 2 mV
MDC IDC MSMT LEADCHNL RA IMPEDANCE VALUE: 431 Ohm
MDC IDC SET LEADCHNL RV PACING AMPLITUDE: 2.5 V

## 2013-09-04 MED ORDER — PRAVASTATIN SODIUM 20 MG PO TABS: ORAL_TABLET | ORAL | Status: DC

## 2013-09-04 MED ORDER — FENOFIBRATE MICRONIZED 134 MG PO CAPS: ORAL_CAPSULE | ORAL | Status: DC

## 2013-09-04 MED ORDER — FLUOXETINE HCL 10 MG PO CAPS: ORAL_CAPSULE | ORAL | Status: DC

## 2013-09-04 MED ORDER — GLIMEPIRIDE 2 MG PO TABS: ORAL_TABLET | ORAL | Status: DC

## 2013-09-04 MED ORDER — METFORMIN HCL 500 MG PO TABS: ORAL_TABLET | ORAL | Status: DC

## 2013-09-04 MED ORDER — LOSARTAN POTASSIUM 100 MG PO TABS: 100.0000 mg | ORAL_TABLET | Freq: Every day | ORAL | Status: DC

## 2013-09-04 NOTE — Progress Notes (Signed)
Pacemaker check in clinic. Normal device function. Thresholds, sensing, impedances consistent with previous measurements. Device programmed to maximize longevity. Pt in AF 99% of time. + Warfarin. No high ventricular rates noted. Device programmed at appropriate safety margins. Histogram distribution appropriate for patient activity level. Device programmed to optimize intrinsic conduction. Estimated longevity 5.75 to 8.25 years. ROV in 6 mths w/JA.

## 2013-09-04 NOTE — Telephone Encounter (Signed)
#  90, NR 

## 2013-09-04 NOTE — Telephone Encounter (Signed)
Last ov 05/09/13 Med last filled 01/03/13 #90 plus 2

## 2013-09-05 MED ORDER — WARFARIN SODIUM 2 MG PO TABS: 2.0000 mg | ORAL_TABLET | Freq: Every day | ORAL | Status: DC

## 2013-09-11 ENCOUNTER — Ambulatory Visit (INDEPENDENT_AMBULATORY_CARE_PROVIDER_SITE_OTHER): Payer: Medicare Other | Admitting: General Practice

## 2013-09-11 DIAGNOSIS — I4891 Unspecified atrial fibrillation: Secondary | ICD-10-CM

## 2013-09-11 DIAGNOSIS — Z5181 Encounter for therapeutic drug level monitoring: Secondary | ICD-10-CM

## 2013-09-11 LAB — POCT INR: INR: 3.3

## 2013-09-11 NOTE — Progress Notes (Signed)
Pre visit review using our clinic review tool, if applicable. No additional management support is needed unless otherwise documented below in the visit note. 

## 2013-09-14 ENCOUNTER — Encounter: Payer: Self-pay | Admitting: Internal Medicine

## 2013-09-19 ENCOUNTER — Ambulatory Visit (INDEPENDENT_AMBULATORY_CARE_PROVIDER_SITE_OTHER): Payer: Medicare Other | Admitting: Internal Medicine

## 2013-09-19 ENCOUNTER — Encounter: Payer: Self-pay | Admitting: Internal Medicine

## 2013-09-19 VITALS — BP 138/80 | HR 65 | Temp 97.4°F | Resp 14 | Wt 216.8 lb

## 2013-09-19 DIAGNOSIS — J209 Acute bronchitis, unspecified: Secondary | ICD-10-CM

## 2013-09-19 DIAGNOSIS — J310 Chronic rhinitis: Secondary | ICD-10-CM

## 2013-09-19 NOTE — Progress Notes (Deleted)
   Subjective:    Patient ID: Albert Hawkins, male    DOB: 10/18/1940, 73 y.o.   MRN: [id number]  HPI    Review of Systems     Objective:   Physical Exam        Assessment & Plan:

## 2013-09-19 NOTE — Progress Notes (Signed)
Pre visit review using our clinic review tool, if applicable. No additional management support is needed unless otherwise documented below in the visit note. 

## 2013-09-19 NOTE — Progress Notes (Signed)
   Subjective:    Patient ID: Albert Hawkins, male    DOB: 1940-05-13, 73 y.o.   MRN: [id number]  HPI   He was seen 09/01/13 and prescribed doxycycline 100 mg twice a day for 10 days. He also received Tessalon Perles as well as narcotic cough syrup.   He continues to have white sputum but it is thinner in character. He also has clear drainage from nares  He has exertional dyspnea  One trigger may have been pollen. He has sneezing as well as wheezing.   He quit smoking in 1987. He is exposed to secondhand E cigarette use by his wife.     Review of Systems  His appetite has decreased and he notes hoarseness. The sore throat which was present initially has resolved. He describes associated fatigue. He specifically denies itchy, watery eyes. He is not having fever, chills, or sweats  There is no pleuritic chest discomfort.  There is no purulence from the head or chest. He has no frontal headache or facial pain.  Heart burn is not a significant issue at this time        Objective:   Physical Exam General appearance:central weight gain;well nourished; no acute distress or increased work of breathing is present.  No  lymphadenopathy about the head, neck, or axilla noted.   Eyes: No conjunctival inflammation or lid edema is present. There is no scleral icterus.  Ears:  External ear exam shows no significant lesions or deformities.  Otoscopic examination reveals clear canals, tympanic membranes are intact bilaterally without bulging, retraction, inflammation or discharge.  Nose:  External nasal examination shows no deformity or inflammation. Nasal mucosa are pink and moist without lesions or exudates. No septal dislocation or deviation.No obstruction to airflow.   Oral exam: Dental hygiene is good; lips and gums are healthy appearing.There is no oropharyngeal erythema or exudate noted.   Neck:  No deformities,  masses, or tenderness noted.      Heart:  Normal rate and clinically  regular rhythm (reprted to be in AF chronically). S1 and S2 normal without gallop, murmur, click, rub or other extra sounds.   Lungs:Chest clear to auscultation; no wheezes, rhonchi,rales ,or rubs present.No increased work of breathing.    Extremities:  No cyanosis, edema, or clubbing  noted    Skin: Warm & dry          Assessment & Plan:  #1 acute bronchitis w/o bronchospasm @ this time #2 rhinitis #3 possible GERD component from Doxycycline Plan: See orders and recommendations

## 2013-09-19 NOTE — Patient Instructions (Signed)
Plain Mucinex (NOT D) for thick secretions ;force NON dairy fluids .   Nasal cleansing in the shower as discussed with lather of mild shampoo.After 10 seconds wash off lather while  exhaling through nostrils. Make sure that all residual soap is removed to prevent irritation.  Flonase OR Nasacort AQ 1 spray in each nostril twice a day as needed. Use the "crossover" technique into opposite nostril spraying toward opposite ear @ 45 degree angle, not straight up into nostril.  Use a Neti pot daily only  as needed for significant sinus congestion; going from open side to congested side . Plain Allegra (NOT D )  160 daily , Loratidine 10 mg , OR Zyrtec 10 mg @ bedtime  as needed for itchy eyes & sneezing.  Symbicort  two inhalations every 12 hours; gargle and spit after use

## 2013-10-23 ENCOUNTER — Ambulatory Visit (INDEPENDENT_AMBULATORY_CARE_PROVIDER_SITE_OTHER): Payer: Medicare Other | Admitting: General Practice

## 2013-10-23 DIAGNOSIS — Z5181 Encounter for therapeutic drug level monitoring: Secondary | ICD-10-CM

## 2013-10-23 DIAGNOSIS — I4891 Unspecified atrial fibrillation: Secondary | ICD-10-CM

## 2013-10-23 LAB — POCT INR: INR: 2.4

## 2013-10-23 NOTE — Progress Notes (Signed)
Pre visit review using our clinic review tool, if applicable. No additional management support is needed unless otherwise documented below in the visit note. 

## 2013-11-01 ENCOUNTER — Telehealth: Payer: Self-pay | Admitting: *Deleted

## 2013-11-01 DIAGNOSIS — E119 Type 2 diabetes mellitus without complications: Secondary | ICD-10-CM

## 2013-11-01 NOTE — Telephone Encounter (Signed)
Patient is coming in for his yearly physical Lipid, bmet, a1c Diabetic bundle

## 2013-11-09 ENCOUNTER — Ambulatory Visit (INDEPENDENT_AMBULATORY_CARE_PROVIDER_SITE_OTHER): Payer: Medicare Other | Admitting: Internal Medicine

## 2013-11-09 ENCOUNTER — Encounter: Payer: Self-pay | Admitting: Internal Medicine

## 2013-11-09 ENCOUNTER — Other Ambulatory Visit (INDEPENDENT_AMBULATORY_CARE_PROVIDER_SITE_OTHER): Payer: Medicare Other

## 2013-11-09 VITALS — BP 130/70 | HR 60 | Temp 97.9°F | Ht 70.0 in | Wt 218.8 lb

## 2013-11-09 DIAGNOSIS — E1159 Type 2 diabetes mellitus with other circulatory complications: Secondary | ICD-10-CM

## 2013-11-09 DIAGNOSIS — E669 Obesity, unspecified: Secondary | ICD-10-CM | POA: Insufficient documentation

## 2013-11-09 DIAGNOSIS — E785 Hyperlipidemia, unspecified: Secondary | ICD-10-CM

## 2013-11-09 DIAGNOSIS — I4891 Unspecified atrial fibrillation: Secondary | ICD-10-CM

## 2013-11-09 DIAGNOSIS — I482 Chronic atrial fibrillation, unspecified: Secondary | ICD-10-CM

## 2013-11-09 DIAGNOSIS — K219 Gastro-esophageal reflux disease without esophagitis: Secondary | ICD-10-CM

## 2013-11-09 DIAGNOSIS — I10 Essential (primary) hypertension: Secondary | ICD-10-CM

## 2013-11-09 LAB — CBC WITH DIFFERENTIAL/PLATELET
BASOS ABS: 0 10*3/uL (ref 0.0–0.1)
Basophils Relative: 0.3 % (ref 0.0–3.0)
EOS ABS: 0.3 10*3/uL (ref 0.0–0.7)
Eosinophils Relative: 3.5 % (ref 0.0–5.0)
HCT: 44.1 % (ref 39.0–52.0)
Hemoglobin: 14.8 g/dL (ref 13.0–17.0)
LYMPHS PCT: 24.7 % (ref 12.0–46.0)
Lymphs Abs: 1.9 10*3/uL (ref 0.7–4.0)
MCHC: 33.5 g/dL (ref 30.0–36.0)
MCV: 87.8 fl (ref 78.0–100.0)
Monocytes Absolute: 0.6 10*3/uL (ref 0.1–1.0)
Monocytes Relative: 8.5 % (ref 3.0–12.0)
Neutro Abs: 4.8 10*3/uL (ref 1.4–7.7)
Neutrophils Relative %: 63 % (ref 43.0–77.0)
Platelets: 214 10*3/uL (ref 150.0–400.0)
RBC: 5.03 Mil/uL (ref 4.22–5.81)
RDW: 13.7 % (ref 11.5–15.5)
WBC: 7.6 10*3/uL (ref 4.0–10.5)

## 2013-11-09 LAB — BASIC METABOLIC PANEL
BUN: 20 mg/dL (ref 6–23)
CALCIUM: 9.8 mg/dL (ref 8.4–10.5)
CHLORIDE: 96 meq/L (ref 96–112)
CO2: 28 meq/L (ref 19–32)
Creatinine, Ser: 1.3 mg/dL (ref 0.4–1.5)
GFR: 57.38 mL/min — ABNORMAL LOW (ref >60.00)
GLUCOSE: 171 mg/dL — AB (ref 70–99)
Potassium: 3.9 mEq/L (ref 3.5–5.1)
Sodium: 133 mEq/L — ABNORMAL LOW (ref 135–145)

## 2013-11-09 LAB — HEPATIC FUNCTION PANEL
ALT: 31 U/L (ref 0–53)
AST: 31 U/L (ref 0–37)
Albumin: 4 g/dL (ref 3.5–5.2)
Alkaline Phosphatase: 37 U/L — ABNORMAL LOW (ref 39–117)
BILIRUBIN DIRECT: 0.2 mg/dL (ref 0.0–0.3)
Total Bilirubin: 0.9 mg/dL (ref 0.2–1.2)
Total Protein: 6.8 g/dL (ref 6.0–8.3)

## 2013-11-09 LAB — LIPID PANEL
CHOL/HDL RATIO: 3
Cholesterol: 161 mg/dL (ref 0–200)
HDL: 46.1 mg/dL (ref >39.00)
LDL Cholesterol: 44 mg/dL (ref 0–99)
NonHDL: 114.9
Triglycerides: 355 mg/dL — ABNORMAL HIGH (ref 0.0–149.0)
VLDL: 71 mg/dL — AB (ref 0.0–40.0)

## 2013-11-09 LAB — MICROALBUMIN / CREATININE URINE RATIO
Creatinine,U: 105.6 mg/dL
MICROALB/CREAT RATIO: 2 mg/g (ref 0.0–30.0)
Microalb, Ur: 2.1 mg/dL — ABNORMAL HIGH (ref 0.0–1.9)

## 2013-11-09 LAB — TSH: TSH: 1.13 u[IU]/mL (ref 0.35–4.50)

## 2013-11-09 LAB — HEMOGLOBIN A1C: HEMOGLOBIN A1C: 7.7 % — AB (ref 4.6–6.5)

## 2013-11-09 NOTE — Assessment & Plan Note (Signed)
CBC

## 2013-11-09 NOTE — Progress Notes (Signed)
Subjective:    Patient ID: Albert Hawkins, male    DOB: 12-13-1940, 73 y.o.   MRN: [id number]  HPI  He is here to assess status of active health conditions:  Diet/ nutrition:no program Exercise program: no, golf 2X/ week (in cart)  HYPERTENSION: Disease Monitoring: Blood pressure range/ average : 120-130/75-85 Medication Compliance:yes  Diabetes :  FBS range/average: average 130-135 Highest 2 hr post meal glucose:no Medication compliance:yes Hypoglycemia:no Ophthamology care:UTD Podiatry care:not UTD  HYPERLIPIDEMIA: Disease Monitoring: Medication Compliance:yes     Review of Systems  Chest pain, palpitations: no      Dyspnea:no Edema:improved Claudication: no Lightheadedness,Syncope:no Weight gain/loss:down 10# Polyuria/phagia/dipsia:  no    Blurred vision /diplopia/lossof vision:occasional blurring Limb numbness/tingling/burning:no Non healing skin lesions:no Abd pain, bowel changes: no  Myalgias: no but arthralgias of shoulder with A/C Memory loss:no       Objective:   Physical Exam   Significant or distinguishing  findings on physical exam are documented first.  Below that are other systems examined & findings.   Pattern alopecia is present. He does have a mustache His rhythm is regular; history an old EKG suggests a paced rhythm Breath sounds are decreased over the upper lung fields posteriorly Gynecomastia is suggested Abdomen is protuberant. He does have some crepitus in the knees without associated effusion There is some increased tone in the lower extremities with range of motion testing There isolated fungal toenail changes.    Head: Normocephalic without obvious abnormalities Eyes: No corneal or conjunctival inflammation noted. Pupils equal round reactive to light and accommodation. Extraocular motion intact.  Ears: External  ear exam reveals no significant lesions or deformities. Canals clear .TMs normal. Hearing is grossly normal  bilaterally. Nose: External nasal exam reveals no deformity or inflammation. Nasal mucosa are pink and moist. No lesions or exudates noted.   Mouth: Oral mucosa and oropharynx reveal no lesions or exudates. Teeth in good repair. Neck: No deformities, masses, or tenderness noted. Range of motion & Thyroid normal. Lungs: Normal respiratory effort; chest expands symmetrically. No increased work of breathing. Heart: No gallop, click, or rub. No murmur. Abdomen: Bowel sounds normal; abdomen soft and nontender. No masses, organomegaly or hernias noted. Genitalia: dewferred                           Musculoskeletal/extremities: No deformity or scoliosis noted of  the thoracic or lumbar spine. No clubbing, cyanosis, edema, or significant extremity  deformity noted. Range of motion normal .Strength normal. Hand joints normal  Fingernail health good. Able to lie down & sit up w/o help. Negative SLR bilaterally Vascular: Carotid, radial artery, dorsalis pedis and  posterior tibial pulses are full and equal. No bruits present. Neurologic: Alert and oriented x3. Deep tendon reflexes symmetrical and normal.  Gait normal.   Skin: Intact without suspicious lesions or rashes. Lymph: No cervical, axillary lymphadenopathy present. Psych: Mood and affect are normal. Normally interactive                                                                                       Assessment & Plan:  See  Current Assessment & Plan in Problem List under specific Diagnosis

## 2013-11-09 NOTE — Patient Instructions (Signed)

## 2013-11-09 NOTE — Assessment & Plan Note (Signed)
A1c , urine microalbumin, BMET 

## 2013-11-09 NOTE — Assessment & Plan Note (Signed)
Blood pressure goals reviewed. BMET 

## 2013-11-09 NOTE — Progress Notes (Signed)
Pre visit review using our clinic review tool, if applicable. No additional management support is needed unless otherwise documented below in the visit note. 

## 2013-11-09 NOTE — Assessment & Plan Note (Addendum)
Lipids, LFTs, TSH  

## 2013-11-13 ENCOUNTER — Other Ambulatory Visit: Payer: Self-pay | Admitting: Internal Medicine

## 2013-11-13 DIAGNOSIS — E1159 Type 2 diabetes mellitus with other circulatory complications: Secondary | ICD-10-CM

## 2013-12-04 ENCOUNTER — Ambulatory Visit: Payer: Medicare Other

## 2013-12-05 ENCOUNTER — Ambulatory Visit (INDEPENDENT_AMBULATORY_CARE_PROVIDER_SITE_OTHER): Payer: Medicare Other | Admitting: Family

## 2013-12-05 DIAGNOSIS — I4891 Unspecified atrial fibrillation: Secondary | ICD-10-CM

## 2013-12-05 DIAGNOSIS — I482 Chronic atrial fibrillation, unspecified: Secondary | ICD-10-CM

## 2013-12-05 DIAGNOSIS — Z5181 Encounter for therapeutic drug level monitoring: Secondary | ICD-10-CM

## 2013-12-05 LAB — POCT INR: INR: 3.3

## 2013-12-05 NOTE — Patient Instructions (Signed)
Hold coumadin. Continue to take 2 mg M/W/F, 1 mg all other days.   Recheck in 6 weeks.  Anticoagulation Dose Instructions as of 12/05/2013     Dorene Grebe Tue Wed Thu Fri Sat   New Dose 1 mg 2 mg 1 mg 2 mg 1 mg 2 mg 1 mg    Description       Hold coumadin. Continue to take 2 mg M/W/F, 1 mg all other days.   Recheck in 6 weeks.

## 2013-12-15 ENCOUNTER — Other Ambulatory Visit: Payer: Self-pay

## 2013-12-15 MED ORDER — HYDROCHLOROTHIAZIDE 12.5 MG PO CAPS: 12.5000 mg | ORAL_CAPSULE | Freq: Every day | ORAL | Status: DC

## 2013-12-15 MED ORDER — FLUOXETINE HCL 10 MG PO CAPS: ORAL_CAPSULE | ORAL | Status: DC

## 2014-01-15 ENCOUNTER — Other Ambulatory Visit: Payer: Self-pay | Admitting: Internal Medicine

## 2014-01-15 ENCOUNTER — Ambulatory Visit (INDEPENDENT_AMBULATORY_CARE_PROVIDER_SITE_OTHER): Payer: Medicare Other | Admitting: Family

## 2014-01-15 DIAGNOSIS — I4891 Unspecified atrial fibrillation: Secondary | ICD-10-CM

## 2014-01-15 DIAGNOSIS — Z5181 Encounter for therapeutic drug level monitoring: Secondary | ICD-10-CM

## 2014-01-15 DIAGNOSIS — I482 Chronic atrial fibrillation, unspecified: Secondary | ICD-10-CM

## 2014-01-15 LAB — POCT INR: INR: 2.7

## 2014-01-15 NOTE — Patient Instructions (Signed)
Continue to take 2 mg M/W/F, 1 mg all other days.   Recheck in 6 weeks.  Anticoagulation Dose Instructions as of 01/15/2014     Dorene Grebe Tue Wed Thu Fri Sat   New Dose 1 mg 2 mg 1 mg 2 mg 1 mg 2 mg 1 mg    Description       Continue to take 2 mg M/W/F, 1 mg all other days.   Recheck in 6 weeks.

## 2014-02-02 ENCOUNTER — Ambulatory Visit (INDEPENDENT_AMBULATORY_CARE_PROVIDER_SITE_OTHER): Payer: Medicare Other

## 2014-02-02 DIAGNOSIS — Z23 Encounter for immunization: Secondary | ICD-10-CM

## 2014-02-15 LAB — HM DIABETES EYE EXAM

## 2014-02-26 ENCOUNTER — Ambulatory Visit (INDEPENDENT_AMBULATORY_CARE_PROVIDER_SITE_OTHER): Payer: Medicare Other | Admitting: Family

## 2014-02-26 ENCOUNTER — Ambulatory Visit: Payer: Medicare Other

## 2014-02-26 DIAGNOSIS — I482 Chronic atrial fibrillation, unspecified: Secondary | ICD-10-CM

## 2014-02-26 DIAGNOSIS — Z5181 Encounter for therapeutic drug level monitoring: Secondary | ICD-10-CM

## 2014-02-26 LAB — POCT INR: INR: 2.9

## 2014-02-26 NOTE — Patient Instructions (Signed)
Continue to take 2 mg M/W/F, 1 mg all other days.   Recheck in 6 weeks.  Anticoagulation Dose Instructions as of 02/26/2014      Albert Hawkins Tue Wed Thu Fri Sat   New Dose 1 mg 2 mg 1 mg 2 mg 1 mg 2 mg 1 mg    Description        Continue to take 2 mg M/W/F, 1 mg all other days.   Recheck in 6 weeks.

## 2014-03-05 ENCOUNTER — Other Ambulatory Visit: Payer: Self-pay

## 2014-03-05 MED ORDER — METFORMIN HCL 500 MG PO TABS: ORAL_TABLET | ORAL | Status: DC

## 2014-03-05 MED ORDER — FENOFIBRATE MICRONIZED 134 MG PO CAPS: ORAL_CAPSULE | ORAL | Status: DC

## 2014-03-05 MED ORDER — FLUOXETINE HCL 10 MG PO CAPS: ORAL_CAPSULE | ORAL | Status: DC

## 2014-03-05 MED ORDER — PRAVASTATIN SODIUM 20 MG PO TABS: ORAL_TABLET | ORAL | Status: DC

## 2014-03-05 MED ORDER — LOSARTAN POTASSIUM 100 MG PO TABS: 100.0000 mg | ORAL_TABLET | Freq: Every day | ORAL | Status: DC

## 2014-03-05 MED ORDER — GLIMEPIRIDE 2 MG PO TABS: ORAL_TABLET | ORAL | Status: DC

## 2014-03-19 ENCOUNTER — Encounter: Payer: Self-pay | Admitting: Internal Medicine

## 2014-03-19 ENCOUNTER — Ambulatory Visit (INDEPENDENT_AMBULATORY_CARE_PROVIDER_SITE_OTHER): Payer: Medicare Other | Admitting: Internal Medicine

## 2014-03-19 ENCOUNTER — Telehealth: Payer: Self-pay | Admitting: Internal Medicine

## 2014-03-19 VITALS — BP 124/68 | HR 60 | Ht 70.0 in | Wt 218.0 lb

## 2014-03-19 DIAGNOSIS — I442 Atrioventricular block, complete: Secondary | ICD-10-CM

## 2014-03-19 DIAGNOSIS — I441 Atrioventricular block, second degree: Secondary | ICD-10-CM

## 2014-03-19 DIAGNOSIS — I482 Chronic atrial fibrillation, unspecified: Secondary | ICD-10-CM

## 2014-03-19 LAB — MDC_IDC_ENUM_SESS_TYPE_INCLINIC
Date Time Interrogation Session: 20151123131244
Implantable Pulse Generator Model: 5826
Implantable Pulse Generator Serial Number: 1285538
Lead Channel Impedance Value: 338 Ohm
Lead Channel Pacing Threshold Amplitude: 0.75 V
Lead Channel Sensing Intrinsic Amplitude: 12 mV
Lead Channel Setting Pacing Pulse Width: 0.4 ms
Lead Channel Setting Sensing Sensitivity: 2 mV
MDC IDC MSMT BATTERY IMPEDANCE: 1300 Ohm
MDC IDC MSMT BATTERY VOLTAGE: 2.79 V
MDC IDC MSMT LEADCHNL RV PACING THRESHOLD PULSEWIDTH: 0.4 ms
MDC IDC SET LEADCHNL RV PACING AMPLITUDE: 2.5 V

## 2014-03-19 NOTE — Telephone Encounter (Signed)
Wife called regarding mail order sent in last week to refill metformin, fax was sent according to [facility]. I told pt I relayed the message but also we have had problems with our fax on two days this week.  [phone]

## 2014-03-19 NOTE — Addendum Note (Signed)
Addended by: Janan Halter F on: 03/19/2014 11:49 AM   Modules accepted: Orders

## 2014-03-19 NOTE — Telephone Encounter (Signed)
Phone call from patient's wife stating patient received a letter from [facility] regarding needing clarification of directions of Metformin. Please clarify directions.

## 2014-03-19 NOTE — Progress Notes (Signed)
PCP: Unice Cobble, MD  The patient presents today for routine electrophysiology followup.  Since last being seen in our clinic, the patient reports doing very well.  He remains active and continues to play golf several times per week without limitation. He is unaware of AF.  He is tolerating anticoagulation.  Today, he denies symptoms of palpitations, chest pain, shortness of breath, orthopnea, PND, lower extremity edema, dizziness, presyncope, syncope, or neurologic sequela.  The patient feels that he is tolerating medications without difficulties and is otherwise without complaint today.   Past Medical History  Diagnosis Date  . Mobitz (type) II atrioventricular block     S/P  PPM (SJM) by Dr Rayann Heman  . Benign essential HTN   . Neoplasm of uncertain behavior of skin   . Atrial fibrillation     paroxysmal, on coumadin  . Diabetes mellitus type II   . Hearing loss, conductive, bilateral   . 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 Hyperlipidemia   . Sleep disorder   . 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 PVC's (premature ventricular contractions)    Past Surgical History  Procedure Laterality Date  . Hand surgery      Right thumb amputated and reattached   . Skin cancer excision      above nose   . Pacemaker placement      06/12/2008, Dr. Thompson Grayer  . Hernia repair      Umbilical  . 3 fatty tumors      benign  . No colonoscopy      "did not want one" (warfarin goes against it; 11/09/13)    Current Outpatient Prescriptions  Medication Sig Dispense Refill  . acetaminophen (TYLENOL ARTHRITIS PAIN) 650 MG CR tablet Take 650 mg by mouth daily as needed. arthritis    . cetirizine (ZYRTEC) 10 MG tablet Take 10 mg by mouth daily.     . fenofibrate micronized (LOFIBRA) 134 MG capsule TAKE 1 CAPSULE DAILY (Patient taking differently: TAKE 1 CAPSULE BY MOUTH DAILY) 90 capsule 1  . FLUoxetine (PROZAC) 10 MG capsule TAKE 1  CAPSULE DAILY (Patient taking differently: TAKE 1 CAPSULE BY MOUTH DAILY) 90 capsule 0  . glimepiride (AMARYL) 2 MG tablet TAKE 1/2 TABLET BY MOUTH EVERY MORNING BEFORE BREAKFAST 45 tablet 1  . glucose blood (ONE TOUCH ULTRA TEST) test strip CHECK BLOOD SUGAR ONCE DAILY Dx 250.72 100 each 3  . hydrochlorothiazide (MICROZIDE) 12.5 MG capsule Take 1 capsule (12.5 mg total) by mouth daily. 90 capsule 3  . losartan (COZAAR) 100 MG tablet Take 1 tablet (100 mg total) by mouth daily. 90 tablet 1  . metFORMIN (GLUCOPHAGE) 500 MG tablet TAKE 2 TABLETS TWICE A DAY WITH LARGEST MEALS (Patient taking differently: TAKE 2 TABLETS BY MOUTH TWICE A DAY WITH LARGEST MEALS) 360 tablet 1  . Multiple Vitamin (MULTIVITAMIN) tablet Take 1 tablet by mouth daily.      . pravastatin (PRAVACHOL) 20 MG tablet TAKE 1 TABLET AT BEDTIME (Patient taking differently: TAKE 1 TABLET BY MOUTH AT BEDTIME) 90 tablet 1  . warfarin (COUMADIN) 2 MG tablet Take 1 tablet (2 mg total) by mouth daily. Take as directed 30 tablet 2   No current facility-administered medications for this visit.    Allergies  Allergen Reactions  . Amoxicillin     REACTION: nausea and diarrhea  . Ramipril     Cough only  .  Tramadol Hcl     REACTION: lightheaded    History   Social History  . Marital Status: Married    Spouse Name: N/A    Number of Children: N/A  . Years of Education: N/A   Occupational History  . Not on file.   Social History Main Topics  . Smoking status: Former Smoker -- 2.00 packs/day for 10 years    Types: Cigarettes    Quit date: 04/27/1985  . Smokeless tobacco: Never Used     Comment: smoked age [age], maximum regular consumption up to 1 ppd  . Alcohol Use: No  . Drug Use: No  . Sexual Activity: Not on file   Other Topics Concern  . Not on file   Social History Narrative    Family History  Problem Relation Age of Onset  . Heart attack Paternal Grandfather 89  . Diabetes Paternal Uncle   . Diabetes  Maternal Grandfather   . Skin cancer Father   . Emphysema Father   . Coronary artery disease Father     stent  . Diabetes Maternal Aunt   . Diabetes Maternal Uncle   . Stroke Neg Hx    Physical Exam: Filed Vitals:   03/19/14 1113  BP: 124/68  Pulse: 60  Height: 5\' 10"  (1.778 m)  Weight: 218 lb (98.884 kg)    GEN- The patient is well appearing, alert and oriented x 3 today.   Head- normocephalic, atraumatic Eyes-  Sclera clear, conjunctiva pink Ears- hearing intact Oropharynx- clear Neck- supple, no JVP Lymph- no cervical lymphadenopathy Lungs- Clear to ausculation bilaterally, normal work of breathing Chest- pacemaker pocket is well healed Heart- Regular rate and rhythm (paced) GI- soft, NT, ND, + BS Extremities- no clubbing, cyanosis, or edema MS- no significant deformity or atrophy Neuro- strength and sensation are intact  Pacemaker interrogation- reviewed in detail today,  See PACEART report  Assessment and Plan:  1. Complete heart block Previously mobitz II now progressed to complete heart block Normal pacemaker function See Pace Art report No changes today  2. Longstanding persistent afib He is now in afib all of the time but remains asymptomatic.  Continue long term anticoagulation I dont think that he would benefit from AAD therapy at this point. Obtain an echo to exclude structural changes related to AF  3. HTN Stable No change required today  Return to the device clinic in 6 months I will see again in 1 year

## 2014-03-19 NOTE — Patient Instructions (Addendum)
Your physician wants you to follow-up in: 6 months in the device clinic and 12 months with Dr. Vallery Ridge will receive a reminder letter in the mail two months in advance. If you don't receive a letter, please call our office to schedule the follow-up appointment.  Your physician has requested that you have an echocardiogram. Echocardiography is a painless test that uses sound waves to create images of your heart. It provides your doctor with information about the size and shape of your heart and how well your heart's chambers and valves are working. This procedure takes approximately one hour. There are no restrictions for this procedure.

## 2014-03-20 ENCOUNTER — Encounter: Payer: Self-pay | Admitting: Internal Medicine

## 2014-03-20 NOTE — Telephone Encounter (Signed)
To determine dose need fasting labs entered 11/09/13 as Future Order

## 2014-03-20 NOTE — Telephone Encounter (Signed)
Patient has been advised and will come to the lab to have this done.

## 2014-03-21 ENCOUNTER — Other Ambulatory Visit: Payer: Self-pay | Admitting: Internal Medicine

## 2014-03-21 ENCOUNTER — Ambulatory Visit (HOSPITAL_COMMUNITY): Payer: Medicare Other | Attending: Internal Medicine | Admitting: Cardiology

## 2014-03-21 ENCOUNTER — Other Ambulatory Visit (INDEPENDENT_AMBULATORY_CARE_PROVIDER_SITE_OTHER): Payer: Medicare Other

## 2014-03-21 DIAGNOSIS — I482 Chronic atrial fibrillation, unspecified: Secondary | ICD-10-CM

## 2014-03-21 DIAGNOSIS — Z87891 Personal history of nicotine dependence: Secondary | ICD-10-CM | POA: Insufficient documentation

## 2014-03-21 DIAGNOSIS — E1365 Other specified diabetes mellitus with hyperglycemia: Principal | ICD-10-CM

## 2014-03-21 DIAGNOSIS — I10 Essential (primary) hypertension: Secondary | ICD-10-CM | POA: Diagnosis not present

## 2014-03-21 DIAGNOSIS — E785 Hyperlipidemia, unspecified: Secondary | ICD-10-CM | POA: Diagnosis not present

## 2014-03-21 DIAGNOSIS — E1159 Type 2 diabetes mellitus with other circulatory complications: Secondary | ICD-10-CM

## 2014-03-21 DIAGNOSIS — IMO0002 Reserved for concepts with insufficient information to code with codable children: Secondary | ICD-10-CM

## 2014-03-21 DIAGNOSIS — E1351 Other specified diabetes mellitus with diabetic peripheral angiopathy without gangrene: Secondary | ICD-10-CM

## 2014-03-21 DIAGNOSIS — E119 Type 2 diabetes mellitus without complications: Secondary | ICD-10-CM | POA: Insufficient documentation

## 2014-03-21 DIAGNOSIS — E1151 Type 2 diabetes mellitus with diabetic peripheral angiopathy without gangrene: Secondary | ICD-10-CM

## 2014-03-21 LAB — HEMOGLOBIN A1C: Hgb A1c MFr Bld: 8.1 % — ABNORMAL HIGH (ref 4.6–6.5)

## 2014-03-21 NOTE — Progress Notes (Signed)
Echo performed. 

## 2014-03-23 ENCOUNTER — Ambulatory Visit (INDEPENDENT_AMBULATORY_CARE_PROVIDER_SITE_OTHER): Payer: Medicare Other | Admitting: Physician Assistant

## 2014-03-23 ENCOUNTER — Encounter: Payer: Self-pay | Admitting: Physician Assistant

## 2014-03-23 VITALS — BP 150/70 | HR 65 | Temp 98.6°F | Wt 220.0 lb

## 2014-03-23 DIAGNOSIS — J40 Bronchitis, not specified as acute or chronic: Secondary | ICD-10-CM

## 2014-03-23 DIAGNOSIS — J209 Acute bronchitis, unspecified: Secondary | ICD-10-CM

## 2014-03-23 MED ORDER — DOXYCYCLINE HYCLATE 100 MG PO CAPS: 100.0000 mg | ORAL_CAPSULE | Freq: Two times a day (BID) | ORAL | Status: DC

## 2014-03-23 MED ORDER — HYDROCODONE-HOMATROPINE 5-1.5 MG/5ML PO SYRP: 5.0000 mL | ORAL_SOLUTION | Freq: Three times a day (TID) | ORAL | Status: DC | PRN

## 2014-03-23 MED ORDER — BUDESONIDE-FORMOTEROL FUMARATE 160-4.5 MCG/ACT IN AERO: 2.0000 | INHALATION_SPRAY | Freq: Two times a day (BID) | RESPIRATORY_TRACT | Status: DC

## 2014-03-23 NOTE — Assessment & Plan Note (Signed)
Rx Doxycycline as patient penicillin allergic and refusing other antibiotics.  Increase fluid.  Rest.  Saline nasal spray.  Symbicort refilled.  Rx Hycodan syrup. Place humidifier in bedroom. Patient to call coumadin clinic Monday to see if they would like repeat INR sooner giving antibiotic therapy.

## 2014-03-23 NOTE — Progress Notes (Signed)
Patient presents to clinic today c/o chest congestion, productive cough, fatigue and mild chest tightness.  Patient denies fever, chills, SOB or chest pain.  Symptoms have been worsening over the past week.    Past Medical History  Diagnosis Date  . Mobitz (type) II atrioventricular block     S/P  PPM (SJM) by Dr Rayann Heman  . Benign essential HTN   . Neoplasm of uncertain behavior of skin   . Atrial fibrillation     paroxysmal, on coumadin  . Diabetes mellitus type II   . Hearing loss, conductive, bilateral   . 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 Hyperlipidemia   . Sleep disorder   . 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 PVC's (premature ventricular contractions)     Current Outpatient Prescriptions on File Prior to Visit  Medication Sig Dispense Refill  . acetaminophen (TYLENOL ARTHRITIS PAIN) 650 MG CR tablet Take 650 mg by mouth daily as needed. arthritis    . cetirizine (ZYRTEC) 10 MG tablet Take 10 mg by mouth daily.     . fenofibrate micronized (LOFIBRA) 134 MG capsule TAKE 1 CAPSULE DAILY (Patient taking differently: TAKE 1 CAPSULE BY MOUTH DAILY) 90 capsule 1  . FLUoxetine (PROZAC) 10 MG capsule TAKE 1 CAPSULE DAILY (Patient taking differently: TAKE 1 CAPSULE BY MOUTH DAILY) 90 capsule 0  . glimepiride (AMARYL) 2 MG tablet TAKE 1/2 TABLET BY MOUTH EVERY MORNING BEFORE BREAKFAST 45 tablet 1  . glucose blood (ONE TOUCH ULTRA TEST) test strip CHECK BLOOD SUGAR ONCE DAILY Dx 250.72 100 each 3  . hydrochlorothiazide (MICROZIDE) 12.5 MG capsule Take 1 capsule (12.5 mg total) by mouth daily. 90 capsule 3  . losartan (COZAAR) 100 MG tablet Take 1 tablet (100 mg total) by mouth daily. 90 tablet 1  . metFORMIN (GLUCOPHAGE) 500 MG tablet TAKE 2 TABLETS TWICE A DAY WITH LARGEST MEALS (Patient taking differently: TAKE 2 TABLETS BY MOUTH TWICE A DAY WITH LARGEST MEALS) 360 tablet 1  . Multiple Vitamin (MULTIVITAMIN) tablet  Take 1 tablet by mouth daily.      . pravastatin (PRAVACHOL) 20 MG tablet TAKE 1 TABLET AT BEDTIME (Patient taking differently: TAKE 1 TABLET BY MOUTH AT BEDTIME) 90 tablet 1  . warfarin (COUMADIN) 2 MG tablet Take 1 tablet (2 mg total) by mouth daily. Take as directed 30 tablet 2   No current facility-administered medications on file prior to visit.    Allergies  Allergen Reactions  . Amoxicillin     REACTION: nausea and diarrhea  . Ramipril     Cough only  . Tramadol Hcl     REACTION: lightheaded    Family History  Problem Relation Age of Onset  . Heart attack Paternal Grandfather 89  . Diabetes Paternal Uncle   . Diabetes Maternal Grandfather   . Skin cancer Father   . Emphysema Father   . Coronary artery disease Father     stent  . Diabetes Maternal Aunt   . Diabetes Maternal Uncle   . Stroke Neg Hx     History   Social History  . Marital Status: Married    Spouse Name: N/A    Number of Children: N/A  . Years of Education: N/A   Social History Main Topics  . Smoking status: Former Smoker -- 2.00 packs/day for 10 years    Types: Cigarettes    Quit date:  04/27/1985  . Smokeless tobacco: Never Used     Comment: smoked age [age], maximum regular consumption up to 1 ppd  . Alcohol Use: No  . Drug Use: No  . Sexual Activity: None   Other Topics Concern  . None   Social History Narrative    Review of Systems - See HPI.  All other ROS are negative.  BP 150/70 mmHg  Pulse 65  Temp(Src) 98.6 F (37 C) (Oral)  Wt 220 lb (99.791 kg)  SpO2 97%  Physical Exam  Constitutional: He is oriented to person, place, and time and well-developed, well-nourished, and in no distress.  HENT:  Head: Normocephalic and atraumatic.  Right Ear: Tympanic membrane, external ear and ear canal normal.  Left Ear: Tympanic membrane, external ear and ear canal normal.  Nose: Nose normal.  Mouth/Throat: Uvula is midline, oropharynx is clear and moist and mucous membranes are  normal.  Eyes: Conjunctivae are normal. Pupils are equal, round, and reactive to light.  Neck: Neck supple.  Cardiovascular: Normal rate, regular rhythm, normal heart sounds and intact distal pulses.   Pulmonary/Chest: Effort normal. No respiratory distress. He has no wheezes. He has no rales. He exhibits no tenderness.  Neurological: He is alert and oriented to person, place, and time.  Skin: Skin is warm and dry. No rash noted.  Psychiatric: Affect normal.  Vitals reviewed.  Recent Results (from the past 2160 hour(s))  POCT INR     Status: None   Collection Time: 01/15/14 12:00 AM  Result Value Ref Range   INR 2.7   POCT INR     Status: None   Collection Time: 02/26/14 12:00 AM  Result Value Ref Range   INR 2.9   Implantable device check     Status: None   Collection Time: 03/19/14  1:12 PM  Result Value Ref Range   Date Time Interrogation Session [phone]    Pulse Generator Manufacturer [employer]    Pulse Gen Model 5826 Zephyr XL DR    Pulse Gen Serial Number [id number]    RV Sense Sensitivity 2.0 mV   RV Pace PulseWidth 0.4 ms   RV Pace Amplitude 2.50 V   RV IMPEDANCE 338 ohm   RV Amplitude 12.0 mV   RV Pacing Amplitude 0.75 V   RV Pacing PulseWidth 0.4 ms   Battery Status Unknown    Battery Longevity 6.50 - 7.50 years    Battery Voltage 2.79 V   Battery Impedance 1300.0 ohm   Eval Rhythm AF w/VS @ 50    Miscellaneous Comment      Pacemaker check in clinic. Normal device function. Thresholds, sensing, impedances consistent with previous measurements. Device programmed to maximize longevity. Pt in AF 100% of time--changed to  VVIR. + Warfarin. No high ventricular rates noted.  Device programmed at appropriate safety margins--turned off RV autocapture due to out of range readings. Histogram distribution appropriate for patient activity level. Device programmed to optimize intrinsic conduction. Estimated longevity 6.50 to 7.50  years. ROV in 6 mths w/device clinic  and 12 mths w/JA.   Hemoglobin A1c     Status: Abnormal   Collection Time: 03/21/14 10:25 AM  Result Value Ref Range   Hgb A1c MFr Bld 8.1 (H) 4.6 - 6.5 %    Comment: Glycemic Control Guidelines for People with Diabetes:Non Diabetic:  <6%Goal of Therapy: <7%Additional Action Suggested:  >8%     Assessment/Plan: Bronchitis with bronchospasm Rx Doxycycline as patient penicillin allergic and refusing other  antibiotics.  Increase fluid.  Rest.  Saline nasal spray.  Symbicort refilled.  Rx Hycodan syrup. Place humidifier in bedroom. Patient to call coumadin clinic Monday to see if they would like repeat INR sooner giving antibiotic therapy.

## 2014-03-23 NOTE — Patient Instructions (Signed)
Take antibiotic as directed.  Increase fluid intake.  Rest.  Use Cough syrup and Symbicort as directed to help with cough and bronchospasm. Place a humidifier in the bedroom.  Call the Coumadin clinic Monday to see when they want to recheck your INR giving the new antibiotic.  Acute Bronchitis Bronchitis is inflammation of the airways that extend from the windpipe into the lungs (bronchi). The inflammation often causes mucus to develop. This leads to a cough, which is the most common symptom of bronchitis.  In acute bronchitis, the condition usually develops suddenly and goes away over time, usually in a couple weeks. Smoking, allergies, and asthma can make bronchitis worse. Repeated episodes of bronchitis may cause further lung problems.  CAUSES Acute bronchitis is most often caused by the same virus that causes a cold. The virus can spread from person to person (contagious) through coughing, sneezing, and touching contaminated objects. SIGNS AND SYMPTOMS   Cough.   Fever.   Coughing up mucus.   Body aches.   Chest congestion.   Chills.   Shortness of breath.   Sore throat.  DIAGNOSIS  Acute bronchitis is usually diagnosed through a physical exam. Your health care provider will also ask you questions about your medical history. Tests, such as chest X-rays, are sometimes done to rule out other conditions.  TREATMENT  Acute bronchitis usually goes away in a couple weeks. Oftentimes, no medical treatment is necessary. Medicines are sometimes given for relief of fever or cough. Antibiotic medicines are usually not needed but may be prescribed in certain situations. In some cases, an inhaler may be recommended to help reduce shortness of breath and control the cough. A cool mist vaporizer may also be used to help thin bronchial secretions and make it easier to clear the chest.  HOME CARE INSTRUCTIONS  Get plenty of rest.   Drink enough fluids to keep your urine clear or pale  yellow (unless you have a medical condition that requires fluid restriction). Increasing fluids may help thin your respiratory secretions (sputum) and reduce chest congestion, and it will prevent dehydration.   Take medicines only as directed by your health care provider.  If you were prescribed an antibiotic medicine, finish it all even if you start to feel better.  Avoid smoking and secondhand smoke. Exposure to cigarette smoke or irritating chemicals will make bronchitis worse. If you are a smoker, consider using nicotine gum or skin patches to help control withdrawal symptoms. Quitting smoking will help your lungs heal faster.   Reduce the chances of another bout of acute bronchitis by washing your hands frequently, avoiding people with cold symptoms, and trying not to touch your hands to your mouth, nose, or eyes.   Keep all follow-up visits as directed by your health care provider.  SEEK MEDICAL CARE IF: Your symptoms do not improve after 1 week of treatment.  SEEK IMMEDIATE MEDICAL CARE IF:  You develop an increased fever or chills.   You have chest pain.   You have severe shortness of breath.  You have bloody sputum.   You develop dehydration.  You faint or repeatedly feel like you are going to pass out.  You develop repeated vomiting.  You develop a severe headache. MAKE SURE YOU:   Understand these instructions.  Will watch your condition.  Will get help right away if you are not doing well or get worse. Document Released: 05/21/2004 Document Revised: 08/28/2013 Document Reviewed: 10/04/2012 [facility] Patient Information 2015 [place], [place]. This information is  not intended to replace advice given to you by your health care provider. Make sure you discuss any questions you have with your health care provider.

## 2014-03-23 NOTE — Progress Notes (Signed)
Pre visit review using our clinic review tool, if applicable. No additional management support is needed unless otherwise documented below in the visit note. 

## 2014-03-27 ENCOUNTER — Encounter: Payer: Self-pay | Admitting: Internal Medicine

## 2014-03-27 ENCOUNTER — Other Ambulatory Visit: Payer: Self-pay | Admitting: Internal Medicine

## 2014-04-06 ENCOUNTER — Ambulatory Visit: Payer: Medicare Other | Admitting: Internal Medicine

## 2014-04-09 ENCOUNTER — Ambulatory Visit (INDEPENDENT_AMBULATORY_CARE_PROVIDER_SITE_OTHER): Payer: Medicare Other | Admitting: Family

## 2014-04-09 DIAGNOSIS — I482 Chronic atrial fibrillation, unspecified: Secondary | ICD-10-CM

## 2014-04-09 DIAGNOSIS — Z5181 Encounter for therapeutic drug level monitoring: Secondary | ICD-10-CM

## 2014-04-09 LAB — POCT INR: INR: 2.8

## 2014-04-09 NOTE — Patient Instructions (Signed)
Continue to take 2 mg M/W/F, 1 mg all other days.   Recheck in 6 weeks.  Anticoagulation Dose Instructions as of 04/09/2014      Dorene Grebe Tue Wed Thu Fri Sat   New Dose 1 mg 2 mg 1 mg 2 mg 1 mg 2 mg 1 mg    Description        Continue to take 2 mg M/W/F, 1 mg all other days.   Recheck in 6 weeks.

## 2014-04-10 ENCOUNTER — Ambulatory Visit (INDEPENDENT_AMBULATORY_CARE_PROVIDER_SITE_OTHER)
Admission: RE | Admit: 2014-04-10 | Discharge: 2014-04-10 | Disposition: A | Discharge status: Home / Self Care | Payer: Medicare Other | Source: Ambulatory Visit | Attending: Internal Medicine | Admitting: Internal Medicine

## 2014-04-10 ENCOUNTER — Ambulatory Visit (INDEPENDENT_AMBULATORY_CARE_PROVIDER_SITE_OTHER): Payer: Medicare Other | Admitting: Internal Medicine

## 2014-04-10 ENCOUNTER — Encounter: Payer: Self-pay | Admitting: Internal Medicine

## 2014-04-10 ENCOUNTER — Telehealth: Payer: Self-pay | Admitting: Internal Medicine

## 2014-04-10 VITALS — BP 114/62 | HR 69 | Temp 97.5°F | Wt 212.2 lb

## 2014-04-10 DIAGNOSIS — R059 Cough, unspecified: Secondary | ICD-10-CM

## 2014-04-10 DIAGNOSIS — R05 Cough: Secondary | ICD-10-CM

## 2014-04-10 DIAGNOSIS — J209 Acute bronchitis, unspecified: Secondary | ICD-10-CM

## 2014-04-10 DIAGNOSIS — J310 Chronic rhinitis: Secondary | ICD-10-CM

## 2014-04-10 MED ORDER — CEFUROXIME AXETIL 250 MG PO TABS: 250.0000 mg | ORAL_TABLET | Freq: Two times a day (BID) | ORAL | Status: DC

## 2014-04-10 MED ORDER — PREDNISONE 20 MG PO TABS: 20.0000 mg | ORAL_TABLET | Freq: Two times a day (BID) | ORAL | Status: DC

## 2014-04-10 MED ORDER — HYDROCODONE-HOMATROPINE 5-1.5 MG/5ML PO SYRP: 5.0000 mL | ORAL_SOLUTION | Freq: Four times a day (QID) | ORAL | Status: DC | PRN

## 2014-04-10 NOTE — Progress Notes (Signed)
Pre visit review using our clinic review tool, if applicable. No additional management support is needed unless otherwise documented below in the visit note. 

## 2014-04-10 NOTE — Progress Notes (Signed)
   Subjective:    Patient ID: Albert Hawkins, male    DOB: 05/22/1940, 73 y.o.   MRN: [id number]  HPI    He's actually has had symptoms since 03/20/14. He's had a productive cough for which he was seen 03/23/14. He was given Hycodan, 7 days of doxycycline, and Symbicort.  He has continued to have symptoms and now has intermittent cough productive of thin-thick white sputum sometimes as plugs. Activity exacerbates the cough.Rest does seem to help.  He feels he has had fever with this ;but this has not been documented. He does have sweats  He has associated wheezing and shortness of breath.  He did take the flu shot. He's had the pneumonia vaccine but not the Prevnar 13.  He is not on ACE inhibitor.  Glucoses have ranged 119-146. He is scheduled to see the endocrinologist tomorrow.  His PT/INR was 2.8 yesterday. He is in chronic atrial fib according to his cardiologist. Review of Systems     He does have head congestion with rhinitis. There is no persistent frontal headache, facial pain, nasal purulence, dental pain, otic pain, otic discharge.     Objective:   Physical Exam Pertinent or positive findings include: Pattern alopecia is present He has a small mustache Heart rhythm is irregular He has a dry cough but chest is clear without wheezing or rhonchi. Abdomen is protuberant.  General appearance:good health ;well nourished; no acute distress or increased work of breathing is present.  No  lymphadenopathy about the head, neck, or axilla noted.  Eyes: No conjunctival inflammation or lid edema is present. There is no scleral icterus. Ears:  External ear exam shows no significant lesions or deformities.  Otoscopic examination reveals clear canals, tympanic membranes are intact bilaterally without bulging, retraction, inflammation or discharge. Nose:  External nasal examination shows no deformity or inflammation. Nasal mucosa are pink and moist without lesions or exudates. No septal  dislocation or deviation.No obstruction to airflow.  Oral exam: Dental hygiene is good; lips and gums are healthy appearing.There is no oropharyngeal erythema or exudate noted.  Neck:  No deformities, thyromegaly, masses, or tenderness noted.   Supple with full range of motion without pain.  Heart:  Normal rate and regular rhythm. S1 and S2 normal without gallop, murmur, click, rub or other extra sounds.  Extremities:  No cyanosis, edema, or clubbing  noted  Skin: Warm & dry w/o jaundice or tenting.       Assessment & Plan:  #1 acute bronchitis w/o bronchospasm #2 rhinitis Plan: See orders and recommendations

## 2014-04-10 NOTE — Patient Instructions (Addendum)
Plain Mucinex (NOT D) for thick secretions ;force NON dairy fluids .   Nasal cleansing in the shower as discussed with lather of mild shampoo.After 10 seconds wash off lather while  exhaling through nostrils. Make sure that all residual soap is removed to prevent irritation.  Flonase OR Nasacort AQ 1 spray in each nostril twice a day as needed. Use the "crossover" technique into opposite nostril spraying toward opposite ear @ 45 degree angle, not straight up into nostril.  Use a Neti pot daily only  as needed for significant sinus congestion; going from open side to congested side . Plain Allegra (NOT D )  160 daily , Loratidine 10 mg , OR Zyrtec 10 mg @ bedtime  as needed for itchy eyes & sneezing.  Please take a probiotic , Florastor OR Align, every day if the bowels are loose. This will replace the normal bacteria which  are necessary for formation of normal stool and processing of food. 

## 2014-04-10 NOTE — Telephone Encounter (Signed)
Resent [facility].../[facility] 

## 2014-04-10 NOTE — Telephone Encounter (Signed)
Pt called in and predniSONE (DELTASONE) 20 MG tablet [[id number]]  Was sent to mail order instead of [facility] on [address] [employer]

## 2014-04-11 ENCOUNTER — Ambulatory Visit (INDEPENDENT_AMBULATORY_CARE_PROVIDER_SITE_OTHER): Payer: Medicare Other | Admitting: Endocrinology

## 2014-04-11 ENCOUNTER — Encounter: Payer: Self-pay | Admitting: Endocrinology

## 2014-04-11 VITALS — BP 124/80 | HR 70 | Temp 97.9°F | Ht 70.0 in | Wt 213.0 lb

## 2014-04-11 DIAGNOSIS — E1351 Other specified diabetes mellitus with diabetic peripheral angiopathy without gangrene: Secondary | ICD-10-CM

## 2014-04-11 DIAGNOSIS — IMO0002 Reserved for concepts with insufficient information to code with codable children: Secondary | ICD-10-CM

## 2014-04-11 DIAGNOSIS — E1365 Other specified diabetes mellitus with hyperglycemia: Principal | ICD-10-CM

## 2014-04-11 MED ORDER — REPAGLINIDE 1 MG PO TABS: 1.0000 mg | ORAL_TABLET | Freq: Three times a day (TID) | ORAL | Status: DC

## 2014-04-11 NOTE — Patient Instructions (Addendum)
good diet and exercise habits significanly improve the control of your diabetes.  please let me know if you wish to be referred to a dietician.  high blood sugar is very risky to your health.  you should see an eye doctor and dentist every year.  It is very important to get all recommended vaccinations.  controlling your blood pressure and cholesterol drastically reduces the damage diabetes does to your body.  Those who smoke should quit.  please discuss these with your doctor.  check your blood sugar once a day.  vary the time of day when you check, between before the 3 meals, and at bedtime.  also check if you have symptoms of your blood sugar being too high or too low.  please keep a record of the readings and bring it to your next appointment here.  You can write it on any piece of paper.  please call us sooner if your blood sugar goes below 70, or if you have a lot of readings over 200.  This is especially important now, since you are on prednisone.  Please change the glimepiride to "repaglinde."  i have sent a prescription to your pharmacy.   Please come back for a follow-up appointment in 3 months.

## 2014-04-11 NOTE — Progress Notes (Signed)
Subjective:    Patient ID: Albert Hawkins, male    DOB: 04-06-1941, 73 y.o.   MRN: [id number]  HPI pt states DM was dx'ed in 2012; he has mild if any neuropathy of the lower extremities, but he has associated PAD; he has never been on insulin; pt says his diet and exercise are just ok; he has never had pancreatitis, severe hypoglycemia or DKA.  He has been on the same 2 oral dm meds x 1-2 years. He take amaryl just 1 mg qd, due to mild hypoglycemia.   Past Medical History  Diagnosis Date  . Mobitz (type) II atrioventricular block     S/P  PPM (SJM) by Dr Rayann Heman  . Benign essential HTN   . Neoplasm of uncertain behavior of skin   . Atrial fibrillation     paroxysmal, on coumadin  . Diabetes mellitus type II   . Hearing loss, conductive, bilateral   . 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 Hyperlipidemia   . Sleep disorder   . 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 PVC's (premature ventricular contractions)     Past Surgical History  Procedure Laterality Date  . Hand surgery      Right thumb amputated and reattached   . Skin cancer excision      above nose   . Pacemaker placement      06/12/2008, Dr. Thompson Grayer  . Hernia repair      Umbilical  . 3 fatty tumors      benign  . No colonoscopy      "did not want one" (warfarin goes against it; 11/09/13)    History   Social History  . Marital Status: Married    Spouse Name: N/A    Number of Children: N/A  . Years of Education: N/A   Occupational History  . Not on file.   Social History Main Topics  . Smoking status: Former Smoker -- 2.00 packs/day for 10 years    Types: Cigarettes    Quit date: 04/27/1985  . Smokeless tobacco: Never Used     Comment: smoked age [age], maximum regular consumption up to 1 ppd  . Alcohol Use: No  . Drug Use: No  . Sexual Activity: Not on file   Other Topics Concern  . Not on file   Social History Narrative     Current Outpatient Prescriptions on File Prior to Visit  Medication Sig Dispense Refill  . acetaminophen (TYLENOL ARTHRITIS PAIN) 650 MG CR tablet Take 650 mg by mouth daily as needed. arthritis    . budesonide-formoterol (SYMBICORT) 160-4.5 MCG/ACT inhaler Inhale 2 puffs into the lungs 2 (two) times daily. 1 Inhaler 3  . cefUROXime (CEFTIN) 250 MG tablet Take 1 tablet (250 mg total) by mouth 2 (two) times daily with a meal. 20 tablet 0  . cetirizine (ZYRTEC) 10 MG tablet Take 10 mg by mouth daily.     Marland Kitchen doxycycline (VIBRAMYCIN) 100 MG capsule Take 1 capsule (100 mg total) by mouth 2 (two) times daily. 14 capsule 0  . fenofibrate micronized (LOFIBRA) 134 MG capsule TAKE 1 CAPSULE DAILY (Patient taking differently: TAKE 1 CAPSULE BY MOUTH DAILY) 90 capsule 1  . FLUoxetine (PROZAC) 10 MG capsule TAKE 1 CAPSULE DAILY (Patient taking differently: TAKE 1 CAPSULE BY MOUTH DAILY) 90 capsule 0  . glucose blood (ONE TOUCH ULTRA TEST) test strip CHECK BLOOD  SUGAR ONCE DAILY Dx 250.72 100 each 3  . hydrochlorothiazide (MICROZIDE) 12.5 MG capsule Take 1 capsule (12.5 mg total) by mouth daily. 90 capsule 3  . HYDROcodone-homatropine (HYDROMET) 5-1.5 MG/5ML syrup Take 5 mLs by mouth every 6 (six) hours as needed for cough. 120 mL 0  . losartan (COZAAR) 100 MG tablet Take 1 tablet (100 mg total) by mouth daily. 90 tablet 1  . metFORMIN (GLUCOPHAGE) 500 MG tablet TAKE 2 BY MOUTH TWICE DAILY WITH LARGEST MEALS 360 tablet 0  . Multiple Vitamin (MULTIVITAMIN) tablet Take 1 tablet by mouth daily.      . pravastatin (PRAVACHOL) 20 MG tablet TAKE 1 TABLET AT BEDTIME (Patient taking differently: TAKE 1 TABLET BY MOUTH AT BEDTIME) 90 tablet 1  . predniSONE (DELTASONE) 20 MG tablet Take 1 tablet (20 mg total) by mouth 2 (two) times daily. 14 tablet 0  . warfarin (COUMADIN) 2 MG tablet Take 1 tablet (2 mg total) by mouth daily. Take as directed 30 tablet 2   No current facility-administered medications on file prior  to visit.    Allergies  Allergen Reactions  . Amoxicillin     REACTION: nausea and diarrhea  . Ramipril     Cough only  . Tramadol Hcl     REACTION: lightheaded    Family History  Problem Relation Age of Onset  . Heart attack Paternal Grandfather 89  . Diabetes Paternal Uncle   . Diabetes Maternal Grandfather   . Skin cancer Father   . Emphysema Father   . Coronary artery disease Father     stent  . Diabetes Maternal Aunt   . Diabetes Maternal Uncle   . Stroke Neg Hx     BP 124/80 mmHg  Pulse 70  Temp(Src) 97.9 F (36.6 C) (Oral)  Ht 5\' 10"  (1.778 m)  Wt 213 lb (96.616 kg)  BMI 30.56 kg/m2  SpO2 97%   Review of Systems denies weight loss, blurry vision, headache, chest pain, sob, n/v, urinary frequency, muscle cramps, excessive diaphoresis, depression, cold intolerance, and rhinorrhea.  He has easy bruising.       Objective:   Physical Exam VS: see vs page GEN: no distress Pulses: dorsalis pedis intact bilat.   Feet: no deformity.  Trace bilat leg edema.  There is bilateral onychomycosis.   Skin:  no ulcer on the feet.  normal color and temp. Neuro: sensation is intact to touch on the feet    Lab Results  Component Value Date   HGBA1C 8.1* 03/21/2014   i reviewed electrocardiogram: 03/03/13  i have reviewed the following old records: On 03/21/14, pt was noted to have elevated a1c, and ref here.    Assessment & Plan:  DM: new to me: moderate exacerbation.   Side-effect of rx: hypoglycemia.   Patient is advised the following: Patient Instructions  good diet and exercise habits significanly improve the control of your diabetes.  please let me know if you wish to be referred to a dietician.  high blood sugar is very risky to your health.  you should see an eye doctor and dentist every year.  It is very important to get all recommended vaccinations.  controlling your blood pressure and cholesterol drastically reduces the damage diabetes does to your body.   Those who smoke should quit.  please discuss these with your doctor.  check your blood sugar once a day.  vary the time of day when you check, between before the 3 meals, and at bedtime.  also check if you have symptoms of your blood sugar being too high or too low.  please keep a record of the readings and bring it to your next appointment here.  You can write it on any piece of paper.  please call us sooner if your blood sugar goes below 70, or if you have a lot of readings over 200.  This is especially important now, since you are on prednisone.  Please change the glimepiride to "repaglinde."  i have sent a prescription to your pharmacy.   Please come back for a follow-up appointment in 3 months.

## 2014-04-30 ENCOUNTER — Encounter: Payer: Self-pay | Admitting: Internal Medicine

## 2014-05-07 ENCOUNTER — Telehealth: Payer: Self-pay | Admitting: Endocrinology

## 2014-05-07 MED ORDER — GLIMEPIRIDE 2 MG PO TABS: ORAL_TABLET | ORAL | Status: DC

## 2014-05-07 NOTE — Telephone Encounter (Signed)
Patient stated that his meds Repaglinde 1 mg, is not working at all, he is feeling drowsy, dizzy, headache and pain in muscles. Please advise

## 2014-05-07 NOTE — Telephone Encounter (Signed)
See below and please advise, Thanks!  

## 2014-05-07 NOTE — Telephone Encounter (Signed)
Please change repaglinide back to glimepiride i'll see you next time.

## 2014-05-08 NOTE — Telephone Encounter (Signed)
Pt advised of note below and voiced understanding,

## 2014-05-21 ENCOUNTER — Ambulatory Visit (INDEPENDENT_AMBULATORY_CARE_PROVIDER_SITE_OTHER): Payer: Medicare Other | Admitting: Family

## 2014-05-21 DIAGNOSIS — I482 Chronic atrial fibrillation, unspecified: Secondary | ICD-10-CM

## 2014-05-21 DIAGNOSIS — Z5181 Encounter for therapeutic drug level monitoring: Secondary | ICD-10-CM

## 2014-05-21 LAB — POCT INR: INR: 2.5

## 2014-05-21 NOTE — Patient Instructions (Signed)
Continue to take 2 mg M/W/F, 1 mg all other days.   Recheck in 6 weeks.  Anticoagulation Dose Instructions as of 05/21/2014      Albert Hawkins Tue Wed Thu Fri Sat   New Dose 1 mg 2 mg 1 mg 2 mg 1 mg 2 mg 1 mg    Description        Continue to take 2 mg M/W/F, 1 mg all other days.   Recheck in 6 weeks.

## 2014-06-21 ENCOUNTER — Other Ambulatory Visit: Payer: Self-pay | Admitting: Internal Medicine

## 2014-06-21 ENCOUNTER — Other Ambulatory Visit: Payer: Self-pay | Admitting: General Practice

## 2014-06-21 MED ORDER — WARFARIN SODIUM 2 MG PO TABS: 2.0000 mg | ORAL_TABLET | Freq: Every day | ORAL | Status: DC

## 2014-06-21 NOTE — Telephone Encounter (Signed)
OK X 3 mos 

## 2014-07-02 ENCOUNTER — Ambulatory Visit (INDEPENDENT_AMBULATORY_CARE_PROVIDER_SITE_OTHER): Payer: Medicare Other | Admitting: General Practice

## 2014-07-02 DIAGNOSIS — Z5181 Encounter for therapeutic drug level monitoring: Secondary | ICD-10-CM | POA: Diagnosis not present

## 2014-07-02 LAB — POCT INR: INR: 3

## 2014-07-02 NOTE — Progress Notes (Signed)
Pre visit review using our clinic review tool, if applicable. No additional management support is needed unless otherwise documented below in the visit note. 

## 2014-07-09 ENCOUNTER — Encounter: Payer: Self-pay | Admitting: Endocrinology

## 2014-07-09 ENCOUNTER — Encounter: Payer: Medicare Other | Attending: Endocrinology | Admitting: Nutrition

## 2014-07-09 ENCOUNTER — Ambulatory Visit (INDEPENDENT_AMBULATORY_CARE_PROVIDER_SITE_OTHER): Payer: Medicare Other | Admitting: Endocrinology

## 2014-07-09 VITALS — BP 142/78 | HR 68 | Temp 98.0°F | Ht 70.0 in | Wt 214.0 lb

## 2014-07-09 DIAGNOSIS — IMO0002 Reserved for concepts with insufficient information to code with codable children: Secondary | ICD-10-CM

## 2014-07-09 DIAGNOSIS — Z794 Long term (current) use of insulin: Secondary | ICD-10-CM | POA: Insufficient documentation

## 2014-07-09 DIAGNOSIS — E1351 Other specified diabetes mellitus with diabetic peripheral angiopathy without gangrene: Secondary | ICD-10-CM

## 2014-07-09 DIAGNOSIS — Z713 Dietary counseling and surveillance: Secondary | ICD-10-CM | POA: Insufficient documentation

## 2014-07-09 DIAGNOSIS — E1365 Other specified diabetes mellitus with hyperglycemia: Principal | ICD-10-CM

## 2014-07-09 LAB — HEMOGLOBIN A1C: HEMOGLOBIN A1C: 7.5 % — AB (ref 4.6–6.5)

## 2014-07-09 NOTE — Patient Instructions (Addendum)
check your blood sugar once a day.  vary the time of day when you check, between before the 3 meals, and at bedtime.  also check if you have symptoms of your blood sugar being too high or too low.  please keep a record of the readings and bring it to your next appointment here.  You can write it on any piece of paper.  please call us sooner if your blood sugar goes below 70, or if you have a lot of readings over 200.    Please come back for a follow-up appointment in 3 months.   blood tests are being requested for you today.  We'll let you know about the results.

## 2014-07-09 NOTE — Progress Notes (Signed)
Subjective:    Patient ID: Albert Hawkins, male    DOB: Feb 16, 1941, 74 y.o.   MRN: [id number]  HPI  Pt returns for f/u of diabetes mellitus: DM type: 2 Dx'ed: 1751 Complications: PAD Therapy: 2 oral meds.   DKA: never Severe hypoglycemia: never Pancreatitis: never Other: he did not tolerate actos (edema) or prandin (dizziness and myalgias); he has never been on insulin. Interval history: no cbg record, but states cbg's are in the mid to high-100's.  pt states he feels well in general.  No recent steroids.   Past Medical History  Diagnosis Date  . Mobitz (type) II atrioventricular block     S/P  PPM (SJM) by Dr Rayann Heman  . Benign essential HTN   . Neoplasm of uncertain behavior of skin   . Atrial fibrillation     paroxysmal, on coumadin  . Diabetes mellitus type II   . Hearing loss, conductive, bilateral   . 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 Hyperlipidemia   . Sleep disorder   . 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 PVC's (premature ventricular contractions)     Past Surgical History  Procedure Laterality Date  . Hand surgery      Right thumb amputated and reattached   . Skin cancer excision      above nose   . Pacemaker placement      06/12/2008, Dr. Thompson Grayer  . Hernia repair      Umbilical  . 3 fatty tumors      benign  . No colonoscopy      "did not want one" (warfarin goes against it; 11/09/13)    History   Social History  . Marital Status: Married    Spouse Name: N/A  . Number of Children: N/A  . Years of Education: N/A   Occupational History  . Not on file.   Social History Main Topics  . Smoking status: Former Smoker -- 2.00 packs/day for 10 years    Types: Cigarettes    Quit date: 04/27/1985  . Smokeless tobacco: Never Used     Comment: smoked age [age], maximum regular consumption up to 1 ppd  . Alcohol Use: No  . Drug Use: No  . Sexual Activity: Not on file   Other  Topics Concern  . Not on file   Social History Narrative    Current Outpatient Prescriptions on File Prior to Visit  Medication Sig Dispense Refill  . acetaminophen (TYLENOL ARTHRITIS PAIN) 650 MG CR tablet Take 650 mg by mouth daily as needed. arthritis    . budesonide-formoterol (SYMBICORT) 160-4.5 MCG/ACT inhaler Inhale 2 puffs into the lungs 2 (two) times daily. 1 Inhaler 3  . cetirizine (ZYRTEC) 10 MG tablet Take 10 mg by mouth daily.     . fenofibrate micronized (LOFIBRA) 134 MG capsule TAKE 1 CAPSULE DAILY (Patient taking differently: TAKE 1 CAPSULE BY MOUTH DAILY) 90 capsule 1  . FLUoxetine (PROZAC) 10 MG capsule TAKE 1 BY MOUTH DAILY 90 capsule 0  . glimepiride (AMARYL) 2 MG tablet TAKE 1/2 TABLET BY MOUTH EVERY MORNING BEFORE BREAKFAST 45 tablet 1  . glucose blood (ONE TOUCH ULTRA TEST) test strip CHECK BLOOD SUGAR ONCE DAILY Dx 250.72 100 each 3  . hydrochlorothiazide (MICROZIDE) 12.5 MG capsule Take 1 capsule (12.5 mg total) by mouth daily. 90 capsule 3  . losartan (COZAAR) 100 MG tablet Take 1  tablet (100 mg total) by mouth daily. 90 tablet 1  . metFORMIN (GLUCOPHAGE) 500 MG tablet TAKE 2 BY MOUTH TWICE DAILY WITH LARGEST MEALS 360 tablet 0  . Multiple Vitamin (MULTIVITAMIN) tablet Take 1 tablet by mouth daily.      . pravastatin (PRAVACHOL) 20 MG tablet TAKE 1 TABLET AT BEDTIME (Patient taking differently: TAKE 1 TABLET BY MOUTH AT BEDTIME) 90 tablet 1  . warfarin (COUMADIN) 2 MG tablet Take 1 tablet (2 mg total) by mouth daily. Take as directed 90 tablet 1   No current facility-administered medications on file prior to visit.    Allergies  Allergen Reactions  . Amoxicillin     REACTION: nausea and diarrhea  . Ramipril     Cough only  . Tramadol Hcl     REACTION: lightheaded    Family History  Problem Relation Age of Onset  . Heart attack Paternal Grandfather 89  . Diabetes Paternal Uncle   . Diabetes Maternal Grandfather   . Skin cancer Father   . Emphysema  Father   . Coronary artery disease Father     stent  . Diabetes Maternal Aunt   . Diabetes Maternal Uncle   . Stroke Neg Hx     BP 142/78 mmHg  Pulse 68  Temp(Src) 98 F (36.7 C) (Oral)  Ht 5\' 10"  (1.778 m)  Wt 214 lb (97.07 kg)  BMI 30.71 kg/m2  SpO2 96%  Review of Systems He denies hypoglycemia and weight change.      Objective:   Physical Exam VITAL SIGNS:  See vs page GENERAL: no distress Pulses: dorsalis pedis intact bilat.   MSK: no deformity of the feet.   CV: 1+ bilat leg edema.   Skin:  no ulcer on the feet.  normal color and temp on the feet. Neuro: sensation is intact to touch on the feet.    Lab Results  Component Value Date   HGBA1C 7.5* 07/09/2014      Assessment & Plan:  DM: this is the best control this pt should aim for, given this sulfonylurea-containing regimen. He declines insulin and DM education today.  He wants to manage DM with metformin and glimepiride if at all possible.    Please continue the same medications.

## 2014-07-11 NOTE — Progress Notes (Signed)
Mr. Majchrzak was shown an insulin pen and how to attach the pen needle,and how to inject the insulin. He reported good understanding of this. We discussed the pathophysiology of diabetes and what he will need to do as far as diet and exercise to stay off the insulin.  We discussed the idea of balanced meals and limiting the amount of carbohydrates in the meal.  We also discussed the need to test blood sugars-both before and 2hr. After, and the goals for those readings.  He reported good understanding of this as well.He had no final questions.

## 2014-07-11 NOTE — Patient Instructions (Signed)
Walk /exercise for 30-40 min. 5 days/wk. Test blood sugars before and 2hr. After 1 meal every day.

## 2014-07-17 ENCOUNTER — Encounter: Payer: Medicare Other | Admitting: Nutrition

## 2014-07-26 ENCOUNTER — Ambulatory Visit (INDEPENDENT_AMBULATORY_CARE_PROVIDER_SITE_OTHER): Payer: Medicare Other | Admitting: General Practice

## 2014-07-26 ENCOUNTER — Other Ambulatory Visit: Payer: Self-pay | Admitting: General Practice

## 2014-07-26 ENCOUNTER — Encounter: Payer: Medicare Other | Admitting: Dietician

## 2014-07-26 VITALS — Ht 71.0 in | Wt 213.0 lb

## 2014-07-26 DIAGNOSIS — Z5181 Encounter for therapeutic drug level monitoring: Secondary | ICD-10-CM | POA: Diagnosis not present

## 2014-07-26 DIAGNOSIS — E1365 Other specified diabetes mellitus with hyperglycemia: Principal | ICD-10-CM

## 2014-07-26 DIAGNOSIS — IMO0002 Reserved for concepts with insufficient information to code with codable children: Secondary | ICD-10-CM

## 2014-07-26 DIAGNOSIS — E1351 Other specified diabetes mellitus with diabetic peripheral angiopathy without gangrene: Secondary | ICD-10-CM

## 2014-07-26 LAB — POCT INR: INR: 2.6

## 2014-07-26 NOTE — Progress Notes (Signed)
Medical Nutrition Therapy:  Appt start time: 1115 end time:  1230.   Assessment:  Primary concerns today: Patient wit)h a hx of Type 2 Diabetes, a-fib on coumadin and sleep apnea (uses C-pap).  Recently on prednisone for illness.  States that he has been sick all winter.  He would like to learn more about nutrition to control his blood sugar.  Checks his blood sugar tid.  Average blood sugar for the past 7 days was 121 which has decreased over the past 3 months from 155.  Checks before breakfast, and had been checking before and after alternate meals.  Blood sugar was never over 180.  Results for COSTON, MANDATO (MRN [id number]) as of 07/26/2014 11:20  Ref. Range 03/03/2012 16:41 10/06/2012 12:22 11/09/2013 14:34 03/21/2014 10:25 07/09/2014 11:24  Hemoglobin A1C Latest Range: 4.6-6.5 % 6.7 (H) 7.5 (H) 7.7 (H) 8.1 (H) 7.5 (H)   Preferred Learning Style:   No preference indicated   Learning Readiness:   Ready   MEDICATIONS: see list   DIETARY INTAKE: Problems eating 3 meals per day secondary to poor appetite.  Will not eat breakfast for 2 hours after waking.    24-hr recall:  B (9-10AM): Little debbie oatmeal cookie and Glucerna today, or bacon and egg on bun or bacon, eggs, grits, toast, or biscuit and gravy and sausage and eggs, or pancakes with SF syrup with sausage or bacon, or raisin bran crunch 2-4 days per week Snk ( AM): L ( PM): none usually Snk (2 PM): Glucerna and a pack of crackers , or gatorade, pack of crackers and oatmeal cookie when golfs. D ( PM): none if good lunch,  Snk ( PM): ice cream or 2-4 fun size candy bars In the middle of the night (2am) beanie weenies, states that he has done this since he was a teen.   Beverages: water, crystal light, diet tea, regular 20 oz Gatorade every time he plays golf, 1 cup of coffee   Usual physical activity: plays golf, used to do silver sneakers but does not fee like exercising with afib and illness that prevented use of  cpap  Estimated energy needs: 1800 calories 200 g carbohydrates 113 g protein 60 g fat  Progress Towards Goal(s):  In progress.   Nutritional Diagnosis:  NB-1.1 Food and nutrition-related knowledge deficit As related to balance of carbohydrate, protein, and fat.  As evidenced by diet hx and patient report..    Intervention:  Nutrition counseling and diabetes education initiated. Discussed Carb Counting by food group as method of portion control, reading food labels, and benefits of increased activity. Also discussed basic physiology of Diabetes, target BG ranges pre and post meals, and A1c.   Plan:  Aim for 4 Carb Choices per meal (60 grams) +/- 1 either way  Aim for 0-2 Carbs per snack if hungry  Include protein in moderation with your meals and snacks Consider reading food labels for Total Carbohydrate and Fat Grams of foods Consider  increasing your activity level by walking  for 30 minutes daily as tolerated Consider checking BG at alternate times per day as directed by MD  Consider taking medication as directed by MD  Discussed being mindful of beverage choices and alternative snack items.   Teaching Method Utilized:  Visual Auditory Hands on.n  Handouts given during visit include:  Label reading  Snack list  Meal plan card  Support group  Barriers to learning/adherence to lifestyle change: none  Demonstrated degree of understanding via:  Teach Back   Monitoring/Evaluation:  Dietary intake, exercise, label reading, and body weight prn.

## 2014-07-26 NOTE — Progress Notes (Signed)
Pre visit review using our clinic review tool, if applicable. No additional management support is needed unless otherwise documented below in the visit note. 

## 2014-07-26 NOTE — Patient Instructions (Signed)
Plan:  Aim for 4 Carb Choices per meal (60 grams) +/- 1 either way  Aim for 0-2 Carbs per snack if hungry  Include protein in moderation with your meals and snacks Consider reading food labels for Total Carbohydrate and Fat Grams of foods Consider  increasing your activity level by walking  for 30 minutes daily as tolerated Consider checking BG at alternate times per day as directed by MD  Consider taking medication as directed by MD  Discussed being mindful of beverage choices and alternative snack items.

## 2014-09-03 ENCOUNTER — Ambulatory Visit: Payer: Medicare Other

## 2014-09-06 ENCOUNTER — Ambulatory Visit (INDEPENDENT_AMBULATORY_CARE_PROVIDER_SITE_OTHER): Payer: Medicare Other | Admitting: General Practice

## 2014-09-06 DIAGNOSIS — Z5181 Encounter for therapeutic drug level monitoring: Secondary | ICD-10-CM

## 2014-09-06 LAB — POCT INR: INR: 2.5

## 2014-09-06 NOTE — Progress Notes (Signed)
Pre visit review using our clinic review tool, if applicable. No additional management support is needed unless otherwise documented below in the visit note. 

## 2014-09-17 ENCOUNTER — Ambulatory Visit (INDEPENDENT_AMBULATORY_CARE_PROVIDER_SITE_OTHER): Payer: Medicare Other | Admitting: *Deleted

## 2014-09-17 DIAGNOSIS — I442 Atrioventricular block, complete: Secondary | ICD-10-CM

## 2014-09-17 DIAGNOSIS — I482 Chronic atrial fibrillation, unspecified: Secondary | ICD-10-CM

## 2014-09-17 LAB — CUP PACEART INCLINIC DEVICE CHECK
Battery Voltage: 2.79 V
Brady Statistic RV Percent Paced: 82 %
Date Time Interrogation Session: 20160523151606
Lead Channel Impedance Value: 315 Ohm
Lead Channel Pacing Threshold Pulse Width: 0.4 ms
Lead Channel Setting Sensing Sensitivity: 2 mV
MDC IDC MSMT BATTERY IMPEDANCE: 1300 Ohm
MDC IDC MSMT LEADCHNL RV PACING THRESHOLD AMPLITUDE: 0.75 V
MDC IDC MSMT LEADCHNL RV SENSING INTR AMPL: 12 mV
MDC IDC SET LEADCHNL RV PACING AMPLITUDE: 2.5 V
MDC IDC SET LEADCHNL RV PACING PULSEWIDTH: 0.4 ms
Pulse Gen Serial Number: 1285538

## 2014-09-17 NOTE — Progress Notes (Signed)
Pacemaker check in clinic. Normal device function. Thresholds, sensing, impedances consistent with previous measurements. Device programmed to maximize longevity. Chronic AF + Warfarin. No high ventricular rates noted. Device programmed at appropriate safety margins. Histogram distribution appropriate for patient activity level. Device programmed to optimize intrinsic conduction. Estimated longevity 5.50 to 7.25 years. ROV 03-18-15 @ 1100 with JA.

## 2014-09-25 ENCOUNTER — Other Ambulatory Visit: Payer: Self-pay | Admitting: Internal Medicine

## 2014-09-25 ENCOUNTER — Encounter: Payer: Self-pay | Admitting: Internal Medicine

## 2014-10-15 ENCOUNTER — Ambulatory Visit (INDEPENDENT_AMBULATORY_CARE_PROVIDER_SITE_OTHER): Payer: Medicare Other | Admitting: General Practice

## 2014-10-15 DIAGNOSIS — I4891 Unspecified atrial fibrillation: Secondary | ICD-10-CM | POA: Diagnosis not present

## 2014-10-15 DIAGNOSIS — Z5181 Encounter for therapeutic drug level monitoring: Secondary | ICD-10-CM | POA: Diagnosis not present

## 2014-10-15 LAB — POCT INR: INR: 2.9

## 2014-10-15 NOTE — Progress Notes (Signed)
Pre visit review using our clinic review tool, if applicable. No additional management support is needed unless otherwise documented below in the visit note. 

## 2014-10-22 ENCOUNTER — Other Ambulatory Visit: Payer: Self-pay

## 2014-11-20 ENCOUNTER — Encounter: Payer: Self-pay | Admitting: Internal Medicine

## 2014-11-22 ENCOUNTER — Other Ambulatory Visit: Payer: Self-pay | Admitting: Internal Medicine

## 2014-11-22 ENCOUNTER — Telehealth: Payer: Self-pay | Admitting: Internal Medicine

## 2014-11-22 DIAGNOSIS — E785 Hyperlipidemia, unspecified: Secondary | ICD-10-CM

## 2014-11-22 DIAGNOSIS — I10 Essential (primary) hypertension: Secondary | ICD-10-CM

## 2014-11-22 DIAGNOSIS — IMO0002 Reserved for concepts with insufficient information to code with codable children: Secondary | ICD-10-CM

## 2014-11-22 DIAGNOSIS — E1351 Other specified diabetes mellitus with diabetic peripheral angiopathy without gangrene: Secondary | ICD-10-CM

## 2014-11-22 DIAGNOSIS — E1365 Other specified diabetes mellitus with hyperglycemia: Secondary | ICD-10-CM

## 2014-11-22 NOTE — Telephone Encounter (Signed)
Please advise which labs you would like to be entered

## 2014-11-22 NOTE — Telephone Encounter (Signed)
Patient insists that he should have lab work done before his CPE on 12/04/2014. He states that he needs the lab to checks potassium. Please notify him that they are entered.

## 2014-11-22 NOTE — Telephone Encounter (Signed)
Orders entered

## 2014-11-23 NOTE — Telephone Encounter (Signed)
Informed pt that labs were in.

## 2014-11-26 ENCOUNTER — Other Ambulatory Visit (INDEPENDENT_AMBULATORY_CARE_PROVIDER_SITE_OTHER): Payer: Medicare Other

## 2014-11-26 ENCOUNTER — Ambulatory Visit (INDEPENDENT_AMBULATORY_CARE_PROVIDER_SITE_OTHER): Payer: Medicare Other | Admitting: General Practice

## 2014-11-26 DIAGNOSIS — E1365 Other specified diabetes mellitus with hyperglycemia: Secondary | ICD-10-CM

## 2014-11-26 DIAGNOSIS — I10 Essential (primary) hypertension: Secondary | ICD-10-CM | POA: Diagnosis not present

## 2014-11-26 DIAGNOSIS — E1351 Other specified diabetes mellitus with diabetic peripheral angiopathy without gangrene: Secondary | ICD-10-CM | POA: Diagnosis not present

## 2014-11-26 DIAGNOSIS — Z5181 Encounter for therapeutic drug level monitoring: Secondary | ICD-10-CM

## 2014-11-26 DIAGNOSIS — E785 Hyperlipidemia, unspecified: Secondary | ICD-10-CM | POA: Diagnosis not present

## 2014-11-26 DIAGNOSIS — I4891 Unspecified atrial fibrillation: Secondary | ICD-10-CM

## 2014-11-26 DIAGNOSIS — IMO0002 Reserved for concepts with insufficient information to code with codable children: Secondary | ICD-10-CM

## 2014-11-26 LAB — BASIC METABOLIC PANEL
BUN: 21 mg/dL (ref 6–23)
CHLORIDE: 102 meq/L (ref 96–112)
CO2: 18 mEq/L — ABNORMAL LOW (ref 19–32)
CREATININE: 1.43 mg/dL (ref 0.40–1.50)
Calcium: 9.7 mg/dL (ref 8.4–10.5)
GFR: 51.7 mL/min — ABNORMAL LOW (ref >60.00)
GLUCOSE: 161 mg/dL — AB (ref 70–99)
POTASSIUM: 4.2 meq/L (ref 3.5–5.1)
Sodium: 136 mEq/L (ref 135–145)

## 2014-11-26 LAB — LDL CHOLESTEROL, DIRECT: Direct LDL: 70 mg/dL

## 2014-11-26 LAB — HEPATIC FUNCTION PANEL
ALT: 27 U/L (ref 0–53)
AST: 28 U/L (ref 0–37)
Albumin: 4.2 g/dL (ref 3.5–5.2)
Alkaline Phosphatase: 35 U/L — ABNORMAL LOW (ref 39–117)
Bilirubin, Direct: 0.1 mg/dL (ref 0.0–0.3)
Total Bilirubin: 0.6 mg/dL (ref 0.2–1.2)
Total Protein: 6.7 g/dL (ref 6.0–8.3)

## 2014-11-26 LAB — LIPID PANEL
CHOL/HDL RATIO: 3
Cholesterol: 142 mg/dL (ref 0–200)
HDL: 42.3 mg/dL (ref >39.00)
NONHDL: 99.59
Triglycerides: 239 mg/dL — ABNORMAL HIGH (ref 0.0–149.0)
VLDL: 47.8 mg/dL — AB (ref 0.0–40.0)

## 2014-11-26 LAB — POCT INR: INR: 3.5

## 2014-11-26 LAB — HEMOGLOBIN A1C: Hgb A1c MFr Bld: 7.4 % — ABNORMAL HIGH (ref 4.6–6.5)

## 2014-11-26 LAB — TSH: TSH: 2.18 u[IU]/mL (ref 0.35–4.50)

## 2014-11-26 NOTE — Progress Notes (Signed)
Pre visit review using our clinic review tool, if applicable. No additional management support is needed unless otherwise documented below in the visit note. 

## 2014-11-26 NOTE — Progress Notes (Signed)
Agree with assessment and plan 

## 2014-12-04 ENCOUNTER — Ambulatory Visit (INDEPENDENT_AMBULATORY_CARE_PROVIDER_SITE_OTHER): Payer: Medicare Other | Admitting: Internal Medicine

## 2014-12-04 ENCOUNTER — Encounter: Payer: Self-pay | Admitting: Internal Medicine

## 2014-12-04 VITALS — BP 138/78 | HR 61 | Temp 97.5°F | Resp 16 | Ht 70.5 in | Wt 215.0 lb

## 2014-12-04 DIAGNOSIS — E1351 Other specified diabetes mellitus with diabetic peripheral angiopathy without gangrene: Secondary | ICD-10-CM | POA: Diagnosis not present

## 2014-12-04 DIAGNOSIS — Z23 Encounter for immunization: Secondary | ICD-10-CM | POA: Diagnosis not present

## 2014-12-04 DIAGNOSIS — E1365 Other specified diabetes mellitus with hyperglycemia: Principal | ICD-10-CM

## 2014-12-04 DIAGNOSIS — IMO0002 Reserved for concepts with insufficient information to code with codable children: Secondary | ICD-10-CM

## 2014-12-04 DIAGNOSIS — Z950 Presence of cardiac pacemaker: Secondary | ICD-10-CM

## 2014-12-04 DIAGNOSIS — E785 Hyperlipidemia, unspecified: Secondary | ICD-10-CM | POA: Diagnosis not present

## 2014-12-04 DIAGNOSIS — I10 Essential (primary) hypertension: Secondary | ICD-10-CM

## 2014-12-04 NOTE — Progress Notes (Signed)
   Subjective:    Patient ID: Albert Hawkins, male    DOB: [birth year], 74 y.o.   MRN: [id number]  HPI The patient is here to assess status of active health conditions.  PMH, FH, & Social History reviewed & updated.No change in [place] as recorded.   He does eat some red meat and fried foods. He does not add salt at the table. He's not exercising.  He describes being under great stress this Summer due to a bankruptcy proceeding. Since he is not playing golf he's noted some exertional dyspnea.  He does note some intermittent blurring of his eyes ; Ophthalmologic exam is up-to-date. He's not had a podiatry evaluation.  He states he remains in atrial fib 100% of the time; he has a pacer.  Blood Pressure ranges 128-132/72-85.  He rarely checks Glucoses ; but they average 116.  He has had no colonoscopy to date; he states he "doesn't want one". Additional impediment has been his chronic warfarin therapy.   Review of Systems   ROS negative for the following: Chest pain, palpitations      Edema Claudication Lightheadedness,Syncope Weight change Polyuria/phagia/dipsia     Diplopia/lossof vision Limb numbness/tingling/burning Non healing skin lesions Abd pain, bowel changes,melena, rectal bleeding  Myalgias Memory loss       Objective:   Physical Exam Pertinent or positive findings include: Pattern alopecia is present. He has a mustache. There is some wax in both canals. Breath sounds are decreased. Heart rhythm is regular. Large ventral hernia is present. He has an early Dupuytren's contraction of the right hand.Pes planus is present.  General appearance :adequately nourished; in no distress. BMI 30.4  Eyes: No conjunctival inflammation or scleral icterus is present.  Oral exam:  Lips and gums are healthy appearing.There is no oropharyngeal erythema or exudate noted. Dental hygiene is good.  Heart:  Normal rate.S1 and S2 normal without gallop, murmur, click, rub or other extra  sounds    Lungs:Chest clear to auscultation; no wheezes, rhonchi,rales ,or rubs present.No increased work of breathing.   Abdomen: bowel sounds normal, soft and non-tender without masses, or organomegaly noted.  No guarding or rebound. No flank tenderness to percussion.  Vascular : all pulses equal ; no bruits present.  Skin:Warm & dry.  Intact without suspicious lesions or rashes ; no tenting or jaundice   Lymphatic: No lymphadenopathy is noted about the head, neck, axilla.   Neuro: Strength, tone & DTRs normal.         Assessment & Plan:  See Current Assessment & Plan in Problem List under specific Diagnosis

## 2014-12-04 NOTE — Progress Notes (Signed)
Pre visit review using our clinic review tool, if applicable. No additional management support is needed unless otherwise documented below in the visit note. 

## 2014-12-04 NOTE — Assessment & Plan Note (Signed)
BP controlled; no change in regimen

## 2014-12-04 NOTE — Assessment & Plan Note (Signed)
Focus on continued improvement in TG discussed

## 2014-12-04 NOTE — Patient Instructions (Signed)
Minimal Blood Pressure Goal= AVERAGE < 140/90;  Ideal is an AVERAGE < 135/85. This AVERAGE should be calculated from @ least 5-7 BP readings taken @ different times of day on different days of week. You should not respond to isolated BP readings , but rather the AVERAGE for that week .Please bring your  blood pressure cuff to office visits to verify that it is reliable.It  can also be checked against the blood pressure device at the pharmacy. Finger or wrist cuffs are not dependable; an arm cuff is.  The most common cause of elevated triglycerides (TG) is the ingestion of sugar from high fructose corn syrup sources added to processed foods & drinks.  Eat a low-fat diet with lots of fruits and vegetables, up to 7-9 servings per day. Consume less than 40 Grams (preferably ZERO) of sugar per day from foods & drinks with High Fructose Corn Syrup (HFCS) sugar as #1,2,3 or # 4 on label.[employer], [employer] [place] do not carry products with HFCS. Follow a  low carb nutrition program such as [place] or The New Sugar Busters  to prevent Diabetes progression . White carbohydrates (potatoes, rice, bread, and pasta) have a high spike of sugar and a high load of sugar. For example a  baked potato has a cup of sugar and a  french fry  2 teaspoons of sugar. Yams, wild  rice, whole grained bread &  wheat pasta have been much lower spike and load of  sugar. Portions should be the size of a deck of cards or your palm.

## 2014-12-04 NOTE — Assessment & Plan Note (Signed)
A1c 7.4% ; no change in regimen

## 2014-12-18 ENCOUNTER — Other Ambulatory Visit: Payer: Self-pay | Admitting: Internal Medicine

## 2014-12-21 ENCOUNTER — Other Ambulatory Visit: Payer: Self-pay | Admitting: Emergency Medicine

## 2014-12-21 ENCOUNTER — Telehealth: Payer: Self-pay | Admitting: Emergency Medicine

## 2014-12-21 MED ORDER — WARFARIN SODIUM 2 MG PO TABS: 2.0000 mg | ORAL_TABLET | Freq: Every day | ORAL | Status: DC

## 2014-12-21 NOTE — Telephone Encounter (Signed)
OK X 90 days

## 2014-12-21 NOTE — Telephone Encounter (Signed)
Refill request from mail order pharm for Warfarin, please advise.

## 2014-12-25 ENCOUNTER — Ambulatory Visit (INDEPENDENT_AMBULATORY_CARE_PROVIDER_SITE_OTHER): Payer: Medicare Other | Admitting: General Practice

## 2014-12-25 DIAGNOSIS — I4891 Unspecified atrial fibrillation: Secondary | ICD-10-CM | POA: Diagnosis not present

## 2014-12-25 DIAGNOSIS — Z5181 Encounter for therapeutic drug level monitoring: Secondary | ICD-10-CM

## 2014-12-25 LAB — POCT INR: INR: 3.3

## 2014-12-25 NOTE — Progress Notes (Signed)
Pre visit review using our clinic review tool, if applicable. No additional management support is needed unless otherwise documented below in the visit note. 

## 2014-12-25 NOTE — Progress Notes (Signed)
I have reviewed and agree with the plan. 

## 2015-01-01 ENCOUNTER — Other Ambulatory Visit: Payer: Self-pay | Admitting: Internal Medicine

## 2015-01-02 ENCOUNTER — Telehealth: Payer: Self-pay | Admitting: *Deleted

## 2015-01-02 MED ORDER — METFORMIN HCL 500 MG PO TABS: 1000.0000 mg | ORAL_TABLET | Freq: Two times a day (BID) | ORAL | Status: DC

## 2015-01-02 NOTE — Telephone Encounter (Signed)
Receive call from pt he states that his metformin was denied. Pt states they only sent him half of his medication. He normally take two 500 mg twice a day. Script that was sent in was incorrect. After reviewing chart md does have him taking 2 tabs twice a day. Inform pt when update script, but [facility] sent the wrong script on 12/18/14 which md assistant refill. Updated med list and sent new script to [facility]...Johny Chess

## 2015-01-22 ENCOUNTER — Ambulatory Visit (INDEPENDENT_AMBULATORY_CARE_PROVIDER_SITE_OTHER): Payer: Medicare Other | Admitting: General Practice

## 2015-01-22 ENCOUNTER — Ambulatory Visit (INDEPENDENT_AMBULATORY_CARE_PROVIDER_SITE_OTHER): Payer: Medicare Other

## 2015-01-22 DIAGNOSIS — Z23 Encounter for immunization: Secondary | ICD-10-CM | POA: Diagnosis not present

## 2015-01-22 DIAGNOSIS — I4891 Unspecified atrial fibrillation: Secondary | ICD-10-CM | POA: Diagnosis not present

## 2015-01-22 DIAGNOSIS — Z5181 Encounter for therapeutic drug level monitoring: Secondary | ICD-10-CM | POA: Diagnosis not present

## 2015-01-22 LAB — POCT INR: INR: 3.3

## 2015-01-22 NOTE — Progress Notes (Signed)
Pre visit review using our clinic review tool, if applicable. No additional management support is needed unless otherwise documented below in the visit note. 

## 2015-01-22 NOTE — Progress Notes (Signed)
I have reviewed and agree with the plan. 

## 2015-02-11 LAB — HM DIABETES EYE EXAM

## 2015-02-19 ENCOUNTER — Ambulatory Visit (INDEPENDENT_AMBULATORY_CARE_PROVIDER_SITE_OTHER): Payer: Medicare Other | Admitting: General Practice

## 2015-02-19 DIAGNOSIS — Z5181 Encounter for therapeutic drug level monitoring: Secondary | ICD-10-CM | POA: Diagnosis not present

## 2015-02-19 LAB — POCT INR: INR: 1.9

## 2015-02-19 NOTE — Progress Notes (Signed)
I have reviewed and agree with the plan. 

## 2015-02-19 NOTE — Progress Notes (Signed)
Pre visit review using our clinic review tool, if applicable. No additional management support is needed unless otherwise documented below in the visit note. 

## 2015-03-18 ENCOUNTER — Encounter: Payer: Self-pay | Admitting: Internal Medicine

## 2015-03-18 ENCOUNTER — Ambulatory Visit (INDEPENDENT_AMBULATORY_CARE_PROVIDER_SITE_OTHER): Payer: Medicare Other | Admitting: Internal Medicine

## 2015-03-18 ENCOUNTER — Other Ambulatory Visit: Payer: Self-pay

## 2015-03-18 VITALS — BP 126/70 | HR 69 | Ht 70.0 in | Wt 212.6 lb

## 2015-03-18 DIAGNOSIS — I442 Atrioventricular block, complete: Secondary | ICD-10-CM | POA: Diagnosis not present

## 2015-03-18 DIAGNOSIS — I482 Chronic atrial fibrillation: Secondary | ICD-10-CM

## 2015-03-18 DIAGNOSIS — I10 Essential (primary) hypertension: Secondary | ICD-10-CM | POA: Diagnosis not present

## 2015-03-18 DIAGNOSIS — Z950 Presence of cardiac pacemaker: Secondary | ICD-10-CM | POA: Diagnosis not present

## 2015-03-18 DIAGNOSIS — I4821 Permanent atrial fibrillation: Secondary | ICD-10-CM

## 2015-03-18 DIAGNOSIS — I495 Sick sinus syndrome: Secondary | ICD-10-CM

## 2015-03-18 DIAGNOSIS — I441 Atrioventricular block, second degree: Secondary | ICD-10-CM

## 2015-03-18 NOTE — Progress Notes (Signed)
PCP: Unice Cobble, MD  The patient presents today for routine electrophysiology followup.  Since last being seen in our clinic, the patient reports doing very well.  He remains active.  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  He is unaware of AF.  He is tolerating anticoagulation.  He did not wear his CPAP for several weeks and became fatigued.  He has since returned to wearing this and feels "much better" now.  Today, he denies symptoms of palpitations, chest pain,   orthopnea, PND, lower extremity edema, dizziness, presyncope, syncope, or neurologic sequela.  He has rare SOB.  The patient feels that he is tolerating medications without difficulties and is otherwise without complaint today.   Past Medical History  Diagnosis Date  . Mobitz (type) II atrioventricular block     S/P  PPM (SJM) by Dr Rayann Heman  . Benign essential HTN   . Neoplasm of uncertain behavior of skin   . Atrial fibrillation (HCC)     paroxysmal, on coumadin  . Diabetes mellitus type II   . Hearing loss, conductive, bilateral   . 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 Hyperlipidemia   . Sleep disorder   . 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 PVC's (premature ventricular contractions)    Past Surgical History  Procedure Laterality Date  . Hand surgery      Right thumb amputated and reattached   . Skin cancer excision      above nose   . Pacemaker placement      06/12/2008, Dr. Thompson Grayer  . Hernia repair      Umbilical  . 3 fatty tumors      benign  . No colonoscopy      "did not want one" (warfarin goes against it; 11/09/13)    Current Outpatient Prescriptions  Medication Sig Dispense Refill  . acetaminophen (TYLENOL ARTHRITIS PAIN) 650 MG CR tablet Take 650 mg by mouth at bedtime. arthritis    . cetirizine (ZYRTEC) 10 MG tablet Take 10 mg by mouth daily.     .  fenofibrate micronized (LOFIBRA) 134 MG capsule Take 1 capsule (134 mg total) by mouth daily before breakfast. 90 capsule 3  . FLUoxetine (PROZAC) 10 MG capsule Take 1 capsule (10 mg total) by mouth daily. 90 capsule 3  . glimepiride (AMARYL) 2 MG tablet TAKE 1/2 BY MOUTH EVERY MORNING BEFORE BREAKFAST. 45 tablet 3  . glucose blood (ONE TOUCH ULTRA TEST) test strip CHECK BLOOD SUGAR ONCE DAILY Dx 250.72 100 each 3  . hydrochlorothiazide (MICROZIDE) 12.5 MG capsule TAKE 1 BY MOUTH DAILY 90 capsule 3  . losartan (COZAAR) 100 MG tablet Take 1 tablet (100 mg total) by mouth daily. 90 tablet 3  . metFORMIN (GLUCOPHAGE) 500 MG tablet Take 2 tablets (1,000 mg total) by mouth 2 (two) times daily with a meal. 360 tablet 3  . Multiple Vitamin (MULTIVITAMIN) tablet Take 1 tablet by mouth daily.      . pravastatin (PRAVACHOL) 20 MG tablet Take 1 tablet (20 mg total) by mouth daily. 90 tablet 3  . Pseudoeph-Doxylamine-DM-APAP (NYQUIL PO) Take 30 mLs by mouth every 6 (six) hours as needed (cold symptoms).    . warfarin (COUMADIN) 2 MG tablet Take 1 tablet (2 mg total) by mouth daily. Take as directed 90 tablet  0   No current facility-administered medications for this visit.    Allergies  Allergen Reactions  . Amoxicillin     REACTION: nausea and diarrhea  . Ramipril     Cough only  . Tramadol Hcl     REACTION: lightheaded    Social History   Social History  . Marital Status: Married    Spouse Name: N/A  . Number of Children: N/A  . Years of Education: N/A   Occupational History  . Not on file.   Social History Main Topics  . Smoking status: Former Smoker -- 2.00 packs/day for 10 years    Types: Cigarettes    Quit date: 04/27/1985  . Smokeless tobacco: Never Used     Comment: smoked age [age], maximum regular consumption up to 1 ppd  . Alcohol Use: No  . Drug Use: No  . Sexual Activity: Not on file   Other Topics Concern  . Not on file   Social History Narrative    Family History   Problem Relation Age of Onset  . Heart attack Paternal Grandfather 89  . Diabetes Paternal Uncle   . Diabetes Maternal Grandfather   . Skin cancer Father   . Emphysema Father   . Coronary artery disease Father     stent  . Diabetes Maternal Aunt   . Diabetes Maternal Uncle   . Stroke Neg Hx    Physical Exam: Filed Vitals:   03/18/15 1119  BP: 126/70  Pulse: 69  Height: 5\' 10"  (1.778 m)  Weight: 212 lb 9.6 oz (96.435 kg)    GEN- The patient is well appearing, alert and oriented x 3 today.   Head- normocephalic, atraumatic Eyes-  Sclera clear, conjunctiva pink Ears- hearing intact Oropharynx- clear Neck- supple, no JVP Lymph- no cervical lymphadenopathy Lungs- Clear to ausculation bilaterally, normal work of breathing Chest- pacemaker pocket is well healed Heart- Regular rate and rhythm (paced) GI- soft, NT, ND, + BS Extremities- no clubbing, cyanosis, or edema MS- no significant deformity or atrophy Neuro- strength and sensation are intact  Pacemaker interrogation- reviewed in detail today,  See PACEART report  Assessment and Plan:  1. Complete heart block Normal pacemaker function See Pace Art report No changes today  2. Permanent afib remains asymptomatic.  Continue long term anticoagulation I dont think that he would benefit from AAD therapy  Consider repeat echo upon return (he does not wish for CV testing at this time)  3. HTN Stable No change required today  Return to the device clinic in 6 months Return to see EP NP in 1 year  Thompson Grayer MD, [facility] 03/18/2015 11:49 AM

## 2015-03-18 NOTE — Patient Instructions (Signed)
Medication Instructions:  Your physician recommends that you continue on your current medications as directed. Please refer to the Current Medication list given to you today.   Labwork: None ordered   Testing/Procedures: None ordered   Follow-Up:   Your physician wants you to follow-up in: 6 months in the device clinic and 12 months with Amber Seiler, NP. You will receive a reminder letter in the mail two months in advance. If you don't receive a letter, please call our office to schedule the follow-up appointment.     Any Other Special Instructions Will Be Listed Below (If Applicable).     If you need a refill on your cardiac medications before your next appointment, please call your pharmacy.   

## 2015-03-19 ENCOUNTER — Ambulatory Visit: Payer: Medicare Other

## 2015-03-19 LAB — CUP PACEART INCLINIC DEVICE CHECK
Implantable Lead Implant Date: 20100216
Implantable Lead Location: 753859
Lead Channel Impedance Value: 361 Ohm
Lead Channel Pacing Threshold Amplitude: 0.75 V
Lead Channel Sensing Intrinsic Amplitude: 12 mV
MDC IDC LEAD IMPLANT DT: 20100216
MDC IDC LEAD LOCATION: 753860
MDC IDC MSMT BATTERY IMPEDANCE: 1500 Ohm
MDC IDC MSMT BATTERY VOLTAGE: 2.81 V
MDC IDC MSMT LEADCHNL RV PACING THRESHOLD PULSEWIDTH: 0.4 ms
MDC IDC SESS DTM: 20161121173712
MDC IDC SET LEADCHNL RV PACING AMPLITUDE: 2.5 V
MDC IDC SET LEADCHNL RV PACING PULSEWIDTH: 0.4 ms
MDC IDC SET LEADCHNL RV SENSING SENSITIVITY: 2 mV
MDC IDC STAT BRADY RV PERCENT PACED: 80 %
Pulse Gen Serial Number: 1285538

## 2015-03-20 ENCOUNTER — Encounter: Payer: Self-pay | Admitting: Internal Medicine

## 2015-03-20 ENCOUNTER — Ambulatory Visit (INDEPENDENT_AMBULATORY_CARE_PROVIDER_SITE_OTHER): Payer: Medicare Other | Admitting: Internal Medicine

## 2015-03-20 ENCOUNTER — Ambulatory Visit (INDEPENDENT_AMBULATORY_CARE_PROVIDER_SITE_OTHER): Payer: Medicare Other | Admitting: General Practice

## 2015-03-20 VITALS — BP 128/64 | HR 60 | Temp 97.6°F | Ht 70.0 in | Wt 212.5 lb

## 2015-03-20 DIAGNOSIS — J209 Acute bronchitis, unspecified: Secondary | ICD-10-CM

## 2015-03-20 DIAGNOSIS — Z5181 Encounter for therapeutic drug level monitoring: Secondary | ICD-10-CM | POA: Diagnosis not present

## 2015-03-20 LAB — POCT INR: INR: 2.4

## 2015-03-20 MED ORDER — AZITHROMYCIN 250 MG PO TABS: ORAL_TABLET | ORAL | Status: DC

## 2015-03-20 NOTE — Patient Instructions (Signed)

## 2015-03-20 NOTE — Progress Notes (Signed)
Pre visit review using our clinic review tool, if applicable. No additional management support is needed unless otherwise documented below in the visit note. 

## 2015-03-20 NOTE — Progress Notes (Signed)
   Subjective:    Patient ID: Albert Hawkins, male    DOB: [birth year], 74 y.o.   MRN: [id number]  HPI His symptoms began 11/16 or 11/17 as arthralgias and myalgias. As of 11/18 or 11/19 he developed a cough productive of whitephlegm. He may have had some wheezing as well. Two days ago he had sore throat which was responsive to Chloraseptic. He describes some itchy, watery eyes as well as sneezing. He's also been using a nasal steroid as well as NyQuil twice to 3 times a day.  He is on warfarin; his PT/INR was 2.4 today.  Review of Systems Frontal headache, facial pain , nasal purulence, dental pain,  otic pain or otic discharge denied. No fever , chills or sweats.    Objective:   Physical Exam Pattern alopecia is present. Wax is present in both canals. He has nasal erythema. Breath sounds are somewhat decreased superiorly.  General appearance:Adequately nourished; no acute distress or increased work of breathing is present.    Lymphatic: No  lymphadenopathy about the head, neck, or axilla .  Eyes: No conjunctival inflammation or lid edema is present. There is no scleral icterus.  Ears:  External ear exam shows no significant lesions or deformities.    Nose:  External nasal examination shows no deformity or inflammation. No septal dislocation or deviation.No obstruction to airflow.   Oral exam: Dental hygiene is good; lips and gums are healthy appearing.There is no oropharyngeal erythema or exudate .  Neck:  No deformities, thyromegaly, masses, or tenderness noted.      Heart:  Normal rate and regular rhythm. S1 and S2 normal without gallop, murmur, click, rub or other extra sounds.   Lungs:Chest clear to auscultation; no wheezes, rhonchi,rales ,or rubs present.  Extremities:  No cyanosis, edema, or clubbing  noted    Skin: Warm & dry w/o tenting or jaundice. No significant lesions or rash.       Assessment & Plan:  #1 acute bronchitis w/o bronchospasm  Plan: See orders  and recommendations

## 2015-03-20 NOTE — Progress Notes (Signed)
I have reviewed and agree with the plan. 

## 2015-05-01 ENCOUNTER — Ambulatory Visit (INDEPENDENT_AMBULATORY_CARE_PROVIDER_SITE_OTHER): Payer: Medicare Other | Admitting: General Practice

## 2015-05-01 DIAGNOSIS — Z5181 Encounter for therapeutic drug level monitoring: Secondary | ICD-10-CM | POA: Diagnosis not present

## 2015-05-01 LAB — POCT INR: INR: 2.4

## 2015-05-01 NOTE — Progress Notes (Signed)
Pre visit review using our clinic review tool, if applicable. No additional management support is needed unless otherwise documented below in the visit note. 

## 2015-05-01 NOTE — Progress Notes (Signed)
I have reviewed and agree with the plan. 

## 2015-05-09 ENCOUNTER — Telehealth: Payer: Self-pay | Admitting: Emergency Medicine

## 2015-05-09 MED ORDER — FLUOXETINE HCL 10 MG PO CAPS: 10.0000 mg | ORAL_CAPSULE | Freq: Every day | ORAL | Status: DC

## 2015-05-09 MED ORDER — WARFARIN SODIUM 2 MG PO TABS: 2.0000 mg | ORAL_TABLET | Freq: Every day | ORAL | Status: DC

## 2015-05-09 MED ORDER — FENOFIBRATE MICRONIZED 134 MG PO CAPS: 134.0000 mg | ORAL_CAPSULE | Freq: Every day | ORAL | Status: DC

## 2015-05-09 MED ORDER — METFORMIN HCL 500 MG PO TABS: 1000.0000 mg | ORAL_TABLET | Freq: Two times a day (BID) | ORAL | Status: DC

## 2015-05-09 MED ORDER — GLUCOSE BLOOD VI STRP: ORAL_STRIP | Status: DC

## 2015-05-09 MED ORDER — HYDROCHLOROTHIAZIDE 12.5 MG PO CAPS: ORAL_CAPSULE | ORAL | Status: DC

## 2015-05-09 MED ORDER — PRAVASTATIN SODIUM 20 MG PO TABS: 20.0000 mg | ORAL_TABLET | Freq: Every day | ORAL | Status: DC

## 2015-05-09 MED ORDER — LOSARTAN POTASSIUM 100 MG PO TABS: 100.0000 mg | ORAL_TABLET | Freq: Every day | ORAL | Status: DC

## 2015-05-09 NOTE — Telephone Encounter (Signed)
Pt is transferring pharmacies, saw [place] in Nov, is transferring to Dr Quay Burow.

## 2015-05-29 ENCOUNTER — Ambulatory Visit (INDEPENDENT_AMBULATORY_CARE_PROVIDER_SITE_OTHER): Payer: Medicare Other | Admitting: Nurse Practitioner

## 2015-05-29 ENCOUNTER — Encounter: Payer: Self-pay | Admitting: Nurse Practitioner

## 2015-05-29 VITALS — BP 144/64 | HR 68 | Temp 98.1°F | Ht 70.0 in | Wt 212.5 lb

## 2015-05-29 DIAGNOSIS — J069 Acute upper respiratory infection, unspecified: Secondary | ICD-10-CM | POA: Diagnosis not present

## 2015-05-29 MED ORDER — AZITHROMYCIN 250 MG PO TABS: ORAL_TABLET | ORAL | Status: DC

## 2015-05-29 NOTE — Progress Notes (Signed)
Pre visit review using our clinic review tool, if applicable. No additional management support is needed unless otherwise documented below in the visit note. 

## 2015-05-29 NOTE — Assessment & Plan Note (Signed)
New onset Will treat conservatively due to probable viral nature Mucinex plain and benadryl at night encouraged  Antibiotic prescription printed/sent to pharmacy in case of no improvement or fever within 48 hours. Pt verbalized understanding of need to hold.  FU prn worsening/failure to improve.

## 2015-05-29 NOTE — Progress Notes (Signed)
Patient ID: Albert Hawkins, male    DOB: 1941/04/04  Age: 75 y.o. MRN: [id number]  CC: Nasal Congestion and Sore Throat   HPI Albert Hawkins presents for CC of nasal congestion x 3 days.   1) Nasal congestion and sore throat He is afraid that he will worsen quickly and Dr. Linna Darner would start early on antibiotics to stay ahead of bacterial infections, per pt.  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   He has past surgical history that includes Hand surgery; Skin cancer excision; pacemaker placement; Hernia repair; 3 fatty tumors; and no colonoscopy.   His family history includes Coronary artery disease in his father; Diabetes in his maternal aunt, maternal grandfather, maternal uncle, and paternal uncle; Emphysema in his father; Heart attack (age of onset: 17) in his paternal grandfather; Skin cancer in his father. There is no history of Stroke.He reports that he quit smoking about 30 years ago. His smoking use included Cigarettes. He has a 20 pack-year smoking history. He has never used smokeless tobacco. He reports that he does not drink alcohol or use illicit drugs.  Outpatient Prescriptions Prior to Visit  Medication Sig Dispense Refill  . acetaminophen (TYLENOL ARTHRITIS PAIN) 650 MG CR tablet Take 650 mg by mouth at bedtime. arthritis    . cetirizine (ZYRTEC) 10 MG tablet Take 10 mg by mouth daily.     . fenofibrate micronized (LOFIBRA) 134 MG capsule Take 1 capsule (134 mg total) by mouth daily before  breakfast. 90 capsule 1  . FLUoxetine (PROZAC) 10 MG capsule Take 1 capsule (10 mg total) by mouth daily. 90 capsule 1  . glimepiride (AMARYL) 2 MG tablet TAKE 1/2 BY MOUTH EVERY MORNING BEFORE BREAKFAST. 45 tablet 3  . glucose blood (ONE TOUCH ULTRA TEST) test strip CHECK BLOOD SUGAR ONCE DAILY Dx 250.72 100 each 1  . hydrochlorothiazide (MICROZIDE) 12.5 MG capsule TAKE 1 BY MOUTH DAILY 90 capsule 1  . losartan (COZAAR) 100 MG tablet Take 1 tablet (100 mg total) by mouth daily. 90 tablet 1  . metFORMIN (GLUCOPHAGE) 500 MG tablet Take 2 tablets (1,000 mg total) by mouth 2 (two) times daily with a meal. 360 tablet 1  . Multiple Vitamin (MULTIVITAMIN) tablet Take 1 tablet by mouth daily.      . pravastatin (PRAVACHOL) 20 MG tablet Take 1 tablet (20 mg total) by mouth daily. 90 tablet 1  . warfarin (COUMADIN) 2 MG tablet Take 1 tablet (2 mg total) by mouth daily. Take as directed 90 tablet 0  . azithromycin (ZITHROMAX) 250 MG tablet 2 day 1 then 1 qd 6 tablet 0  . Pseudoeph-Doxylamine-DM-APAP (NYQUIL PO) Take 30 mLs by mouth every 6 (six) hours as needed (cold symptoms).     No facility-administered medications prior to visit.    ROS Review of Systems  Constitutional: Positive for fatigue. Negative for fever, chills and diaphoresis.  HENT: Positive for congestion and sore throat. Negative for trouble swallowing.   Eyes: Negative for visual disturbance.  Respiratory: Negative for chest tightness, shortness of breath and wheezing.   Cardiovascular: Negative  for chest pain, palpitations and leg swelling.  Gastrointestinal: Negative for nausea, vomiting and diarrhea.  Skin: Negative for rash.  Neurological: Negative for dizziness.    Objective:  BP 144/64 mmHg  Pulse 68  Temp(Src) 98.1 F (36.7 C) (Oral)  Ht 5\' 10"  (1.778 m)  Wt 212 lb 8 oz (96.389 kg)  BMI 30.49 kg/m2  SpO2 98%  Physical Exam  Constitutional: He is oriented to person, place, and time. He appears well-developed and  well-nourished. No distress.  HENT:  Head: Normocephalic and atraumatic.  Right Ear: External ear normal.  Left Ear: External ear normal.  Mouth/Throat: No oropharyngeal exudate.  TM's clear bilaterally  Boggy nares  Cardiovascular: Normal rate, regular rhythm, normal heart sounds and intact distal pulses.  Exam reveals no gallop and no friction rub.   No murmur heard. Pulmonary/Chest: Effort normal and breath sounds normal. No respiratory distress. He has no wheezes. He has no rales. He exhibits no tenderness.  Neurological: He is alert and oriented to person, place, and time.  Skin: Skin is warm and dry. No rash noted. He is not diaphoretic.  Psychiatric: He has a normal mood and affect. His behavior is normal. Judgment and thought content normal.   Assessment & Plan:   Kayzen was seen today for nasal congestion and sore throat.  Diagnoses and all orders for this visit:  Viral URI  Other orders -     azithromycin (ZITHROMAX) 250 MG tablet; Take 2 tablets by mouth on day 1, take 1 tablet by mouth each day after for 4 days.   I have discontinued Mr. Friddle's Pseudoeph-Doxylamine-DM-APAP (NYQUIL PO) and azithromycin. I am also having him start on azithromycin. Additionally, I am having him maintain his cetirizine, acetaminophen, multivitamin, glimepiride, FLUoxetine, fenofibrate micronized, glucose blood, hydrochlorothiazide, losartan, metFORMIN, pravastatin, and warfarin.  Meds ordered this encounter  Medications  . azithromycin (ZITHROMAX) 250 MG tablet    Sig: Take 2 tablets by mouth on day 1, take 1 tablet by mouth each day after for 4 days.    Dispense:  6 each    Refill:  0    Order Specific Question:  Supervising Provider    Answer:  Crecencio Mc [[id number]]     Follow-up: Return if symptoms worsen or fail to improve.

## 2015-05-29 NOTE — Patient Instructions (Signed)
Take the Z-pack if you get a temperature greater than 100.5 or feel worse in 2-3 days.   If you do take the antibiotic- we will need to check your PT/INR for the coumadin (warfarin). Give Caren Griffins a call if you take this.

## 2015-06-12 ENCOUNTER — Ambulatory Visit (INDEPENDENT_AMBULATORY_CARE_PROVIDER_SITE_OTHER): Payer: Medicare Other | Admitting: General Practice

## 2015-06-12 DIAGNOSIS — I4891 Unspecified atrial fibrillation: Secondary | ICD-10-CM

## 2015-06-12 DIAGNOSIS — Z5181 Encounter for therapeutic drug level monitoring: Secondary | ICD-10-CM | POA: Diagnosis not present

## 2015-06-12 LAB — POCT INR: INR: 1.9

## 2015-06-12 NOTE — Progress Notes (Signed)
I have reviewed and agree with the plan. 

## 2015-06-12 NOTE — Progress Notes (Signed)
Pre visit review using our clinic review tool, if applicable. No additional management support is needed unless otherwise documented below in the visit note. 

## 2015-06-14 ENCOUNTER — Telehealth: Payer: Self-pay | Admitting: *Deleted

## 2015-06-14 MED ORDER — OSELTAMIVIR PHOSPHATE 75 MG PO CAPS: 75.0000 mg | ORAL_CAPSULE | Freq: Every day | ORAL | Status: DC

## 2015-06-14 NOTE — Telephone Encounter (Signed)
Sent to pharmacy 

## 2015-06-14 NOTE — Telephone Encounter (Signed)
Notified pt rx sent to pharmacy../lmb 

## 2015-06-14 NOTE — Telephone Encounter (Signed)
Wife left msg on triage stating their grandson has been dx with flu, and his pedetrician advise them to contact their PCP for rx for Tamiflu since he is staying with them. MD out of office pls advise...Johny Chess

## 2015-06-17 ENCOUNTER — Other Ambulatory Visit: Payer: Self-pay

## 2015-06-17 MED ORDER — GLIMEPIRIDE 2 MG PO TABS: ORAL_TABLET | ORAL | Status: DC

## 2015-06-18 ENCOUNTER — Other Ambulatory Visit: Payer: Self-pay | Admitting: General Practice

## 2015-06-18 MED ORDER — GLUCOSE BLOOD VI STRP: ORAL_STRIP | Status: DC

## 2015-07-24 ENCOUNTER — Ambulatory Visit (INDEPENDENT_AMBULATORY_CARE_PROVIDER_SITE_OTHER): Payer: Medicare Other | Admitting: General Practice

## 2015-07-24 DIAGNOSIS — Z5181 Encounter for therapeutic drug level monitoring: Secondary | ICD-10-CM | POA: Diagnosis not present

## 2015-07-24 DIAGNOSIS — I4891 Unspecified atrial fibrillation: Secondary | ICD-10-CM | POA: Diagnosis not present

## 2015-07-24 LAB — POCT INR: INR: 2.4

## 2015-07-24 NOTE — Progress Notes (Signed)
I have reviewed and agree with the plan. 

## 2015-07-24 NOTE — Progress Notes (Signed)
Pre visit review using our clinic review tool, if applicable. No additional management support is needed unless otherwise documented below in the visit note. 

## 2015-07-30 ENCOUNTER — Encounter: Payer: Self-pay | Admitting: Internal Medicine

## 2015-07-30 ENCOUNTER — Ambulatory Visit (INDEPENDENT_AMBULATORY_CARE_PROVIDER_SITE_OTHER): Payer: Medicare Other | Admitting: Internal Medicine

## 2015-07-30 ENCOUNTER — Other Ambulatory Visit (INDEPENDENT_AMBULATORY_CARE_PROVIDER_SITE_OTHER): Payer: Medicare Other

## 2015-07-30 VITALS — BP 132/64 | HR 60 | Temp 98.1°F | Resp 16 | Wt 207.0 lb

## 2015-07-30 DIAGNOSIS — IMO0002 Reserved for concepts with insufficient information to code with codable children: Secondary | ICD-10-CM

## 2015-07-30 DIAGNOSIS — G4733 Obstructive sleep apnea (adult) (pediatric): Secondary | ICD-10-CM

## 2015-07-30 DIAGNOSIS — I10 Essential (primary) hypertension: Secondary | ICD-10-CM

## 2015-07-30 DIAGNOSIS — F32A Depression, unspecified: Secondary | ICD-10-CM

## 2015-07-30 DIAGNOSIS — E1351 Other specified diabetes mellitus with diabetic peripheral angiopathy without gangrene: Secondary | ICD-10-CM | POA: Diagnosis not present

## 2015-07-30 DIAGNOSIS — E1365 Other specified diabetes mellitus with hyperglycemia: Secondary | ICD-10-CM

## 2015-07-30 DIAGNOSIS — F329 Major depressive disorder, single episode, unspecified: Secondary | ICD-10-CM

## 2015-07-30 DIAGNOSIS — E785 Hyperlipidemia, unspecified: Secondary | ICD-10-CM

## 2015-07-30 LAB — COMPREHENSIVE METABOLIC PANEL
ALBUMIN: 4.3 g/dL (ref 3.5–5.2)
ALT: 23 U/L (ref 0–53)
AST: 25 U/L (ref 0–37)
Alkaline Phosphatase: 33 U/L — ABNORMAL LOW (ref 39–117)
BUN: 29 mg/dL — ABNORMAL HIGH (ref 6–23)
CO2: 28 meq/L (ref 19–32)
CREATININE: 1.53 mg/dL — AB (ref 0.40–1.50)
Calcium: 10.1 mg/dL (ref 8.4–10.5)
Chloride: 98 mEq/L (ref 96–112)
GFR: 47.73 mL/min — ABNORMAL LOW (ref >60.00)
Glucose, Bld: 166 mg/dL — ABNORMAL HIGH (ref 70–99)
Potassium: 4.3 mEq/L (ref 3.5–5.1)
SODIUM: 135 meq/L (ref 135–145)
Total Bilirubin: 0.8 mg/dL (ref 0.2–1.2)
Total Protein: 6.8 g/dL (ref 6.0–8.3)

## 2015-07-30 LAB — LIPID PANEL
CHOL/HDL RATIO: 4
Cholesterol: 158 mg/dL (ref 0–200)
HDL: 41.4 mg/dL (ref >39.00)
NonHDL: 116.21
Triglycerides: 279 mg/dL — ABNORMAL HIGH (ref 0.0–149.0)
VLDL: 55.8 mg/dL — AB (ref 0.0–40.0)

## 2015-07-30 LAB — MICROALBUMIN / CREATININE URINE RATIO
CREATININE, U: 139 mg/dL
MICROALB UR: 2 mg/dL — AB (ref 0.0–1.9)
Microalb Creat Ratio: 1.4 mg/g (ref 0.0–30.0)

## 2015-07-30 LAB — HEMOGLOBIN A1C: HEMOGLOBIN A1C: 7.4 % — AB (ref 4.6–6.5)

## 2015-07-30 LAB — LDL CHOLESTEROL, DIRECT: Direct LDL: 70 mg/dL

## 2015-07-30 NOTE — Assessment & Plan Note (Signed)
BP well controlled Current regimen effective and well tolerated Continue current medications at current doses Check cmp 

## 2015-07-30 NOTE — Assessment & Plan Note (Signed)
Check lipid panel, cmp 

## 2015-07-30 NOTE — Assessment & Plan Note (Addendum)
Lab Results  Component Value Date   HGBA1C 7.4* 11/26/2014   Check a1c Could do better with this diet - eats too many sweets Not exercising regularly -- stressed regular exercise Working on weight loss

## 2015-07-30 NOTE — Assessment & Plan Note (Addendum)
Using cpap sometimes, not nightly advised to use it nightly

## 2015-07-30 NOTE — Progress Notes (Signed)
Pre visit review using our clinic review tool, if applicable. No additional management support is needed unless otherwise documented below in the visit note. 

## 2015-07-30 NOTE — Assessment & Plan Note (Signed)
Controlled, stable Continue current medication at current dose

## 2015-07-30 NOTE — Patient Instructions (Addendum)
  Test(s) ordered today. Your results will be released to MyChart (or called to you) after review, usually within 72hours after test completion. If any changes need to be made, you will be notified at that same time.  No immunizations administered today.   Medications reviewed and updated.  No changes recommended at this time.  Please followup in 6 months   

## 2015-07-30 NOTE — Progress Notes (Signed)
Subjective:    Patient ID: Albert Hawkins, male    DOB: February 08, 1941, 75 y.o.   MRN: [id number]  HPI He is here to establish with a new pcp.    Diabetes: He is taking his medication daily as prescribed. He is not compliant with a diabetic diet. He is not exercising regularly. He monitors his sugars on occasion and they have been running in the 130's typicallly. He checks his feet daily and denies foot lesions. He is up-to-date with an ophthalmology examination.   Hypertension, Afib: He is taking his medication daily. He is compliant with a low sodium diet.  He denies chest pain, palpitations, edema, shortness of breath and regular headaches. He is not exercising regularly.  He does not monitor his blood pressure at home.    Hyperlipidemia: He is taking his medication daily. He is compliant with a low fat/cholesterol diet. He is not exercising regularly. He denies myalgias.   Depression: He is taking his medication daily as prescribed. He denies any side effects from the medication. He feels his depression is well controlled and he is happy with his current dose of medication.    Medications and allergies reviewed with patient and updated if appropriate.  Patient Active Problem List   Diagnosis Date Noted  . Bronchitis with bronchospasm 03/23/2014  . Obesity (BMI 30-39.9) 11/09/2013  . Encounter for therapeutic drug monitoring 06/06/2013  . Complete heart block ([place]) 03/03/2013  . OSA (obstructive sleep apnea) 03/28/2012  . 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 Periodic limb movement disorder (PLMD) 12/25/2011  . Depression 01/13/2010  . GERD 05/27/2009  . LUMBAR RADICULOPATHY 07/20/2008  . Second degree AV block, Mobitz type II 07/18/2008  . Essential hypertension 01/16/2008  . 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 ABNORMAL CHEST XRAY 11/20/2007  . Atrial fibrillation ([place]) 11/17/2007  . HEARING LOSS, CONDUCTIVE, BILATERAL 04/14/2007  . DM (diabetes mellitus), secondary,  uncontrolled, with peripheral vascular complications ([place]) [id number]  . Hyperlipidemia 03/17/2007  . 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 SKIN CANCER, HX OF 05/17/2006    Current Outpatient Prescriptions on File Prior to Visit  Medication Sig Dispense Refill  . acetaminophen (TYLENOL ARTHRITIS PAIN) 650 MG CR tablet Take 650 mg by mouth at bedtime. arthritis    . cetirizine (ZYRTEC) 10 MG tablet Take 10 mg by mouth daily.     . fenofibrate micronized (LOFIBRA) 134 MG capsule Take 1 capsule (134 mg total) by mouth daily before breakfast. 90 capsule 1  . FLUoxetine (PROZAC) 10 MG capsule Take 1 capsule (10 mg total) by mouth daily. 90 capsule 1  . glimepiride (AMARYL) 2 MG tablet TAKE 1/2 BY MOUTH EVERY MORNING BEFORE BREAKFAST. 45 tablet 3  . glucose blood (ONE TOUCH ULTRA TEST) test strip CHECK BLOOD SUGAR ONCE DAILY Dx 250.72 100 each 1  . hydrochlorothiazide (MICROZIDE) 12.5 MG capsule TAKE 1 BY MOUTH DAILY 90 capsule 1  . losartan (COZAAR) 100 MG tablet Take 1 tablet (100 mg total) by mouth daily. 90 tablet 1  . metFORMIN (GLUCOPHAGE) 500 MG tablet Take 2 tablets (1,000 mg total) by mouth 2 (two) times daily with a meal. 360 tablet 1  . Multiple Vitamin (MULTIVITAMIN) tablet Take 1 tablet by mouth daily.      . pravastatin (PRAVACHOL) 20 MG tablet Take 1 tablet (20 mg total) by mouth daily. 90 tablet 1  . warfarin (COUMADIN) 2 MG tablet Take 1 tablet (2 mg total) by mouth daily. Take as directed  90 tablet 0   No current facility-administered medications on file prior to visit.    Past Medical History  Diagnosis Date  . Mobitz (type) II atrioventricular block     S/P  PPM (SJM) by Dr Rayann Heman  . Benign essential HTN   . Neoplasm of uncertain behavior of skin   . Atrial fibrillation (HCC)     paroxysmal, on coumadin  . Diabetes mellitus type II   . Hearing loss, conductive, bilateral   . 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 Hyperlipidemia   . Sleep disorder   . 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 PVC's (premature ventricular contractions)     Past Surgical History  Procedure Laterality Date  . Hand surgery      Right thumb amputated and reattached   . Skin cancer excision      above nose   . Pacemaker placement      06/12/2008, Dr. Thompson Grayer  . Hernia repair      Umbilical  . 3 fatty tumors      benign  . No colonoscopy      "did not want one" (warfarin goes against it; 11/09/13)    Social History   Social History  . Marital Status: Married    Spouse Name: N/A  . Number of Children: N/A  . Years of Education: N/A   Social History Main Topics  . Smoking status: Former Smoker -- 2.00 packs/day for 10 years    Types: Cigarettes    Quit date: 04/27/1985  . Smokeless tobacco: Never Used     Comment: smoked age [age], maximum regular consumption up to 1 ppd  . Alcohol Use: No  . Drug Use: No  . Sexual Activity: Not on file   Other Topics Concern  . Not on file   Social History Narrative    Family History  Problem Relation Age of Onset  . Heart attack Paternal Grandfather 89  . Diabetes Paternal Uncle   . Diabetes Maternal Grandfather   . Skin cancer Father   . Emphysema Father   . Coronary artery disease Father     stent  . Diabetes Maternal Aunt   . Diabetes Maternal Uncle   . Stroke Neg Hx     Review of Systems  Constitutional: Negative for fever and chills.  Respiratory: Positive for shortness of breath. Negative for cough and wheezing.   Cardiovascular: Positive for leg swelling (seldom). Negative for chest pain and palpitations.  Musculoskeletal: Negative for myalgias.  Neurological: Negative for dizziness, light-headedness and headaches.       Objective:   Filed Vitals:   07/30/15 0802  BP: 132/64  Pulse: 60  Temp: 98.1 F (36.7 C)  Resp: 16   Filed Weights   07/30/15 0802  Weight: 207 lb (93.895 kg)   Body mass index  is 29.7 kg/(m^2).   Physical Exam Constitutional: Appears well-developed and well-nourished. No distress.  Neck: Neck supple. No tracheal deviation present. No thyromegaly present.  No carotid bruit. No cervical adenopathy.   Cardiovascular: Normal rate, regular rhythm and normal heart sounds.   No murmur heard.  No edema Pulmonary/Chest: Effort normal and breath sounds normal. No respiratory distress. No wheezes.  Psych: normal mood and affect      Assessment & Plan:   See Problem List for Assessment and Plan of chronic medical problems.  Follow up in  6 months  F/u in 6 months

## 2015-08-01 ENCOUNTER — Encounter: Payer: Self-pay | Admitting: Internal Medicine

## 2015-09-06 ENCOUNTER — Ambulatory Visit (INDEPENDENT_AMBULATORY_CARE_PROVIDER_SITE_OTHER): Payer: Medicare Other | Admitting: General Practice

## 2015-09-06 DIAGNOSIS — Z5181 Encounter for therapeutic drug level monitoring: Secondary | ICD-10-CM

## 2015-09-06 DIAGNOSIS — I4891 Unspecified atrial fibrillation: Secondary | ICD-10-CM

## 2015-09-06 LAB — POCT INR: INR: 2.6

## 2015-09-06 NOTE — Progress Notes (Signed)
Pre visit review using our clinic review tool, if applicable. No additional management support is needed unless otherwise documented below in the visit note. 

## 2015-09-06 NOTE — Progress Notes (Signed)
I have reviewed and agree with the plan. 

## 2015-09-16 ENCOUNTER — Encounter: Payer: Self-pay | Admitting: Internal Medicine

## 2015-09-16 ENCOUNTER — Ambulatory Visit (INDEPENDENT_AMBULATORY_CARE_PROVIDER_SITE_OTHER): Payer: Medicare Other | Admitting: *Deleted

## 2015-09-16 DIAGNOSIS — I442 Atrioventricular block, complete: Secondary | ICD-10-CM | POA: Diagnosis not present

## 2015-09-16 NOTE — Progress Notes (Signed)
Pacemaker check in clinic. Normal device function. Threshold, sensing, and impedance consistent with previous measurements. Device programmed to maximize longevity. No high ventricular rates noted. Device programmed at appropriate safety margins. Histogram distribution appropriate for patient activity level. Device programmed to optimize intrinsic conduction. Estimated longevity 4.75-37yrs. ROV with device clinic in 75mo.

## 2015-10-16 ENCOUNTER — Ambulatory Visit: Payer: Medicare Other

## 2015-10-25 ENCOUNTER — Ambulatory Visit (INDEPENDENT_AMBULATORY_CARE_PROVIDER_SITE_OTHER): Payer: Medicare Other | Admitting: General Practice

## 2015-10-25 DIAGNOSIS — I4891 Unspecified atrial fibrillation: Secondary | ICD-10-CM | POA: Diagnosis not present

## 2015-10-25 DIAGNOSIS — Z5181 Encounter for therapeutic drug level monitoring: Secondary | ICD-10-CM | POA: Diagnosis not present

## 2015-10-25 LAB — POCT INR: INR: 2.6

## 2015-10-25 NOTE — Progress Notes (Signed)
I have reviewed and agree with the plan. 

## 2015-10-25 NOTE — Progress Notes (Signed)
Pre visit review using our clinic review tool, if applicable. No additional management support is needed unless otherwise documented below in the visit note. 

## 2015-12-11 ENCOUNTER — Ambulatory Visit: Payer: Medicare Other | Admitting: General Practice

## 2015-12-11 DIAGNOSIS — I4891 Unspecified atrial fibrillation: Secondary | ICD-10-CM

## 2015-12-11 DIAGNOSIS — Z5181 Encounter for therapeutic drug level monitoring: Secondary | ICD-10-CM

## 2015-12-11 LAB — POCT INR: INR: 2.8

## 2015-12-16 ENCOUNTER — Encounter: Payer: Self-pay | Admitting: Internal Medicine

## 2015-12-16 ENCOUNTER — Ambulatory Visit (INDEPENDENT_AMBULATORY_CARE_PROVIDER_SITE_OTHER): Payer: Medicare Other | Admitting: *Deleted

## 2015-12-16 DIAGNOSIS — I442 Atrioventricular block, complete: Secondary | ICD-10-CM | POA: Diagnosis not present

## 2015-12-16 DIAGNOSIS — I4821 Permanent atrial fibrillation: Secondary | ICD-10-CM

## 2015-12-16 DIAGNOSIS — Z950 Presence of cardiac pacemaker: Secondary | ICD-10-CM

## 2015-12-16 LAB — CUP PACEART INCLINIC DEVICE CHECK
Battery Impedance: 1800 Ohm
Brady Statistic RV Percent Paced: 77 %
Date Time Interrogation Session: 20170821143854
Implantable Lead Implant Date: 20100216
Lead Channel Impedance Value: 337 Ohm
Lead Channel Setting Pacing Amplitude: 2.5 V
Lead Channel Setting Pacing Pulse Width: 0.4 ms
Lead Channel Setting Sensing Sensitivity: 2 mV
MDC IDC LEAD IMPLANT DT: 20100216
MDC IDC LEAD LOCATION: 753859
MDC IDC LEAD LOCATION: 753860
MDC IDC MSMT BATTERY VOLTAGE: 2.78 V
MDC IDC MSMT LEADCHNL RV PACING THRESHOLD AMPLITUDE: 1 V
MDC IDC MSMT LEADCHNL RV PACING THRESHOLD PULSEWIDTH: 0.4 ms
MDC IDC MSMT LEADCHNL RV SENSING INTR AMPL: 12 mV — AB
Pulse Gen Model: 5826
Pulse Gen Serial Number: 1285538

## 2015-12-16 NOTE — Progress Notes (Signed)
Pacemaker check in clinic. Normal device function. Thresholds, sensing, impedances consistent with previous measurements. Device programmed to maximize longevity. No episode triggers enabled. Device programmed at appropriate safety margins. Histogram distribution appropriate for patient activity level. Device programmed to optimize intrinsic conduction. Estimated longevity 4.75-6 years. Patient education completed. ROV with RU on 03/23/16.

## 2015-12-19 ENCOUNTER — Other Ambulatory Visit: Payer: Self-pay | Admitting: Internal Medicine

## 2016-01-06 ENCOUNTER — Other Ambulatory Visit: Payer: Self-pay | Admitting: Internal Medicine

## 2016-01-24 ENCOUNTER — Ambulatory Visit (INDEPENDENT_AMBULATORY_CARE_PROVIDER_SITE_OTHER): Payer: Medicare Other | Admitting: General Practice

## 2016-01-24 DIAGNOSIS — I4891 Unspecified atrial fibrillation: Secondary | ICD-10-CM

## 2016-01-24 DIAGNOSIS — Z5181 Encounter for therapeutic drug level monitoring: Secondary | ICD-10-CM

## 2016-01-24 DIAGNOSIS — Z23 Encounter for immunization: Secondary | ICD-10-CM | POA: Diagnosis not present

## 2016-01-24 LAB — POCT INR: INR: 2.6

## 2016-01-24 NOTE — Progress Notes (Signed)
I have reviewed and agree with the plan. 

## 2016-01-30 ENCOUNTER — Encounter: Payer: Self-pay | Admitting: Internal Medicine

## 2016-01-30 ENCOUNTER — Ambulatory Visit (INDEPENDENT_AMBULATORY_CARE_PROVIDER_SITE_OTHER): Payer: Medicare Other | Admitting: Internal Medicine

## 2016-01-30 ENCOUNTER — Other Ambulatory Visit (INDEPENDENT_AMBULATORY_CARE_PROVIDER_SITE_OTHER): Payer: Medicare Other

## 2016-01-30 VITALS — BP 122/66 | HR 58 | Temp 97.8°F | Resp 16 | Wt 208.0 lb

## 2016-01-30 DIAGNOSIS — G4733 Obstructive sleep apnea (adult) (pediatric): Secondary | ICD-10-CM

## 2016-01-30 DIAGNOSIS — I10 Essential (primary) hypertension: Secondary | ICD-10-CM | POA: Diagnosis not present

## 2016-01-30 DIAGNOSIS — E7800 Pure hypercholesterolemia, unspecified: Secondary | ICD-10-CM

## 2016-01-30 DIAGNOSIS — IMO0002 Reserved for concepts with insufficient information to code with codable children: Secondary | ICD-10-CM

## 2016-01-30 DIAGNOSIS — E1365 Other specified diabetes mellitus with hyperglycemia: Secondary | ICD-10-CM | POA: Diagnosis not present

## 2016-01-30 DIAGNOSIS — R7989 Other specified abnormal findings of blood chemistry: Secondary | ICD-10-CM

## 2016-01-30 DIAGNOSIS — F329 Major depressive disorder, single episode, unspecified: Secondary | ICD-10-CM | POA: Diagnosis not present

## 2016-01-30 DIAGNOSIS — F32A Depression, unspecified: Secondary | ICD-10-CM

## 2016-01-30 DIAGNOSIS — E1351 Other specified diabetes mellitus with diabetic peripheral angiopathy without gangrene: Secondary | ICD-10-CM

## 2016-01-30 LAB — MICROALBUMIN / CREATININE URINE RATIO
CREATININE, U: 172.6 mg/dL
Microalb Creat Ratio: 1.4 mg/g (ref 0.0–30.0)
Microalb, Ur: 2.4 mg/dL — ABNORMAL HIGH (ref 0.0–1.9)

## 2016-01-30 LAB — COMPREHENSIVE METABOLIC PANEL
ALBUMIN: 4.1 g/dL (ref 3.5–5.2)
ALT: 25 U/L (ref 0–53)
AST: 24 U/L (ref 0–37)
Alkaline Phosphatase: 36 U/L — ABNORMAL LOW (ref 39–117)
BUN: 26 mg/dL — AB (ref 6–23)
CHLORIDE: 96 meq/L (ref 96–112)
CO2: 29 meq/L (ref 19–32)
CREATININE: 1.48 mg/dL (ref 0.40–1.50)
Calcium: 9.4 mg/dL (ref 8.4–10.5)
GFR: 49.53 mL/min — ABNORMAL LOW (ref >60.00)
Glucose, Bld: 243 mg/dL — ABNORMAL HIGH (ref 70–99)
POTASSIUM: 5 meq/L (ref 3.5–5.1)
SODIUM: 135 meq/L (ref 135–145)
Total Bilirubin: 0.8 mg/dL (ref 0.2–1.2)
Total Protein: 6.9 g/dL (ref 6.0–8.3)

## 2016-01-30 LAB — HEMOGLOBIN A1C: HEMOGLOBIN A1C: 7.7 % — AB (ref 4.6–6.5)

## 2016-01-30 LAB — LDL CHOLESTEROL, DIRECT: Direct LDL: 81 mg/dL

## 2016-01-30 LAB — LIPID PANEL
CHOL/HDL RATIO: 4
CHOLESTEROL: 159 mg/dL (ref 0–200)
HDL: 43 mg/dL (ref >39.00)
NonHDL: 115.74
TRIGLYCERIDES: 297 mg/dL — AB (ref 0.0–149.0)
VLDL: 59.4 mg/dL — AB (ref 0.0–40.0)

## 2016-01-30 NOTE — Patient Instructions (Addendum)
Restart your cpap.  Start regular exercise.  Make sure you are up to date with your eye exam - you should have one annually.   Test(s) ordered today. Your results will be released to [place] (or called to you) after review, usually within 72hours after test completion. If any changes need to be made, you will be notified at that same time.  All other Health Maintenance issues reviewed.   All recommended immunizations and age-appropriate screenings are up-to-date or discussed.  No immunizations administered today.   Medications reviewed and updated.  No changes recommended at this time.  Your prescription(s) have been submitted to your pharmacy. Please take as directed and contact our office if you believe you are having problem(s) with the medication(s).   Please followup in 6 months

## 2016-01-30 NOTE — Progress Notes (Signed)
Pre visit review using our clinic review tool, if applicable. No additional management support is needed unless otherwise documented below in the visit note. 

## 2016-01-30 NOTE — Assessment & Plan Note (Signed)
BP well controlled Current regimen effective and well tolerated Continue current medications at current doses cmp  

## 2016-01-30 NOTE — Assessment & Plan Note (Signed)
Check lipids. Continue statin.

## 2016-01-30 NOTE — Progress Notes (Signed)
Subjective:    Patient ID: Albert Hawkins, male    DOB: 1940-12-28, 75 y.o.   MRN: [id number]  HPI He is here for follow up.  Diabetes: He is taking his medication daily as prescribed. He is not compliant with a diabetic diet. He is not exercising regularly. He monitors his sugars and they have been running 130-140. He checks his feet daily and denies foot lesions. He is up-to-date with an ophthalmology examination.   Hypertension: He is taking his medication daily. He is compliant with a low sodium diet.  He denies chest pain, palpitations, edema, shortness of breath and regular headaches. He is not exercising regularly.  He does not monitor his blood pressure at home.    Hyperlipidemia: He is taking his medication daily. He is compliant with a low fat/cholesterol diet. He is not exercising regularly. He denies myalgias.   Depression: He is taking his medication daily as prescribed. He denies any side effects from the medication. He feels his depression is well controlled and he is happy with his current dose of medication.    Medications and allergies reviewed with patient and updated if appropriate.  Patient Active Problem List   Diagnosis Date Noted  . Bronchitis with bronchospasm 03/23/2014  . Obesity (BMI 30-39.9) 11/09/2013  . Encounter for therapeutic drug monitoring 06/06/2013  . Complete heart block ([place]) 03/03/2013  . OSA (obstructive sleep apnea) 03/28/2012  . 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 Periodic limb movement disorder (PLMD) 12/25/2011  . Depression 01/13/2010  . GERD 05/27/2009  . LUMBAR RADICULOPATHY 07/20/2008  . Second degree AV block, Mobitz type II 07/18/2008  . Essential hypertension 01/16/2008  . 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 ABNORMAL CHEST XRAY 11/20/2007  . Atrial fibrillation ([place]) 11/17/2007  . HEARING LOSS, CONDUCTIVE, BILATERAL 04/14/2007  . DM (diabetes mellitus), secondary, uncontrolled, with peripheral vascular complications  ([place]) [id number]  . Hyperlipidemia 03/17/2007  . 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 SKIN CANCER, HX OF 05/17/2006    Current Outpatient Prescriptions on File Prior to Visit  Medication Sig Dispense Refill  . acetaminophen (TYLENOL ARTHRITIS PAIN) 650 MG CR tablet Take 650 mg by mouth at bedtime. arthritis    . cetirizine (ZYRTEC) 10 MG tablet Take 10 mg by mouth daily.     . fenofibrate micronized (LOFIBRA) 134 MG capsule TAKE 1 CAPSULE BEFORE BREAKFAST. 90 capsule 1  . FLUoxetine (PROZAC) 10 MG capsule TAKE (1) CAPSULE DAILY. 90 capsule 1  . glimepiride (AMARYL) 2 MG tablet TAKE 1/2 BY MOUTH EVERY MORNING BEFORE BREAKFAST. 45 tablet 3  . hydrochlorothiazide (MICROZIDE) 12.5 MG capsule TAKE (1) CAPSULE DAILY. 90 capsule 1  . losartan (COZAAR) 100 MG tablet TAKE 1 TABLET ONCE DAILY. 90 tablet 1  . metFORMIN (GLUCOPHAGE) 500 MG tablet TAKE 2 TABLETS TWICE DAILY WITH MEALS. 360 tablet 1  . Multiple Vitamin (MULTIVITAMIN) tablet Take 1 tablet by mouth daily.      . ONE TOUCH ULTRA TEST test strip TEST BLOOD SUGAR once daily 100 each 1  . pravastatin (PRAVACHOL) 20 MG tablet TAKE 1 TABLET ONCE DAILY. 90 tablet 1  . warfarin (COUMADIN) 2 MG tablet Take 1 tablet (2 mg total) by mouth daily. Take as directed 90 tablet 0   No current facility-administered medications on file prior to visit.     Past Medical History:  Diagnosis Date  . Atrial fibrillation (HCC)    paroxysmal, on coumadin  . Benign essential HTN   .  Diabetes mellitus type II   . Hearing loss, conductive, bilateral   . Hyperlipidemia   . 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 Mobitz (type) II atrioventricular block    S/P  PPM (SJM) by Dr Rayann Heman  . Neoplasm of uncertain behavior of skin   . PVC's (premature ventricular contractions)   . 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 Sleep disorder     Past Surgical History:  Procedure  Laterality Date  . 3 fatty tumors     benign  . HAND SURGERY     Right thumb amputated and reattached   . HERNIA REPAIR     Umbilical  . no colonoscopy     "did not want one" (warfarin goes against it; 11/09/13)  . PACEMAKER PLACEMENT     06/12/2008, Dr. Thompson Grayer  . SKIN CANCER EXCISION     above nose     Social History   Social History  . Marital status: Married    Spouse name: N/A  . Number of children: N/A  . Years of education: N/A   Social History Main Topics  . Smoking status: Former Smoker    Packs/day: 2.00    Years: 10.00    Types: Cigarettes    Quit date: 04/27/1985  . Smokeless tobacco: Never Used     Comment: smoked age [age], maximum regular consumption up to 1 ppd  . Alcohol use No  . Drug use: No  . Sexual activity: Not on file   Other Topics Concern  . Not on file   Social History Narrative  . No narrative on file    Family History  Problem Relation Age of Onset  . Heart attack Paternal Grandfather 89  . Diabetes Paternal Uncle   . Diabetes Maternal Grandfather   . Skin cancer Father   . Emphysema Father   . Coronary artery disease Father     stent  . Diabetes Maternal Aunt   . Diabetes Maternal Uncle   . Stroke Neg Hx     Review of Systems  Constitutional: Negative for fever.  Respiratory: Negative for cough, shortness of breath and wheezing.   Cardiovascular: Negative for chest pain, palpitations and leg swelling.  Neurological: Negative for dizziness, light-headedness and headaches.       Objective:   Vitals:   01/30/16 1115  BP: 122/66  Pulse: (!) 58  Resp: 16  Temp: 97.8 F (36.6 C)   Filed Weights   01/30/16 1115  Weight: 208 lb (94.3 kg)   Body mass index is 29.84 kg/m.   Physical Exam Constitutional: Appears well-developed and well-nourished. No distress.  HENT:  Head: Normocephalic and atraumatic.  Neck: Neck supple. No tracheal deviation present. No thyromegaly present.  No cervical  lymphadenopathy Cardiovascular: Normal rate, regular rhythm and normal heart sounds.   No murmur heard. No carotid bruit .  No edema Pulmonary/Chest: Effort normal and breath sounds normal. No respiratory distress. No has no wheezes. No rales.  Skin: Skin is warm and dry. Not diaphoretic.  Psychiatric: Normal mood and affect. Behavior is normal.         Assessment & Plan:   See Problem List for Assessment and Plan of chronic medical problems.  F/u in 6 months

## 2016-01-30 NOTE — Assessment & Plan Note (Signed)
Not using CPAP nightly and experiencing some fatigue Stressed nightly CPAP

## 2016-01-30 NOTE — Assessment & Plan Note (Addendum)
Check a1c, urine micro Sugars controlled at home encouraged regular exercise and diabetic diet Continue current medications

## 2016-01-30 NOTE — Assessment & Plan Note (Signed)
Controlled, stable Continue current dose of medication  

## 2016-01-30 NOTE — Assessment & Plan Note (Deleted)
GERD controlled Continue daily medication  

## 2016-02-02 ENCOUNTER — Encounter: Payer: Self-pay | Admitting: Internal Medicine

## 2016-02-02 MED ORDER — GLIMEPIRIDE 2 MG PO TABS: 2.0000 mg | ORAL_TABLET | Freq: Every day | ORAL | 3 refills | Status: DC

## 2016-02-06 ENCOUNTER — Encounter: Payer: Self-pay | Admitting: Internal Medicine

## 2016-02-07 LAB — HM DIABETES EYE EXAM

## 2016-02-27 ENCOUNTER — Encounter: Payer: Self-pay | Admitting: Internal Medicine

## 2016-02-27 ENCOUNTER — Ambulatory Visit (INDEPENDENT_AMBULATORY_CARE_PROVIDER_SITE_OTHER): Payer: Medicare Other | Admitting: Internal Medicine

## 2016-02-27 VITALS — BP 136/82 | HR 64 | Temp 97.9°F | Ht 70.0 in | Wt 210.0 lb

## 2016-02-27 DIAGNOSIS — H6123 Impacted cerumen, bilateral: Secondary | ICD-10-CM | POA: Diagnosis not present

## 2016-02-27 NOTE — Assessment & Plan Note (Signed)
Bilateral ears successfully lavaged and hearing improved Continue home remedies, but he will increase help frequently he does this to prevent excessive accumulation of cerumen

## 2016-02-27 NOTE — Patient Instructions (Addendum)
Your ears were cleaned out today.   Continue to use mineral oil or water/hydrogen peroxide combination to help clean out your ears  - do this periodically to prevent ear wax accumulation.

## 2016-02-27 NOTE — Progress Notes (Signed)
Subjective:    Patient ID: Albert Hawkins, male    DOB: [birth year], 75 y.o.   MRN: [id number]  HPI He is here for an acute visit.   He has had ear wax problems for the past 5 years or so and it seems to be getting worse.    Since Monday he has had decreased hearing In both ears.  He denies any pain in either ear. He denies cold symptoms including difficulty swallowing, sore throat, swollen neck glands, headaches or fever. He did try using home remedies to get some of the wax out, but was not successful. He tried mineral oil and a combination of warm water and hydrogen peroxide.   Medications and allergies reviewed with patient and updated if appropriate.  Patient Active Problem List   Diagnosis Date Noted  . Bronchitis with bronchospasm 03/23/2014  . Obesity (BMI 30-39.9) 11/09/2013  . Encounter for therapeutic drug monitoring 06/06/2013  . Complete heart block ([place]) 03/03/2013  . OSA (obstructive sleep apnea) 03/28/2012  . 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 Periodic limb movement disorder (PLMD) 12/25/2011  . Depression 01/13/2010  . GERD 05/27/2009  . LUMBAR RADICULOPATHY 07/20/2008  . Second degree AV block, Mobitz type II 07/18/2008  . Essential hypertension 01/16/2008  . 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 ABNORMAL CHEST XRAY 11/20/2007  . Atrial fibrillation ([place]) 11/17/2007  . HEARING LOSS, CONDUCTIVE, BILATERAL 04/14/2007  . DM (diabetes mellitus), secondary, uncontrolled, with peripheral vascular complications ([place]) [id number]  . Hyperlipidemia 03/17/2007  . 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 SKIN CANCER, HX OF 05/17/2006    Current Outpatient Prescriptions on File Prior to Visit  Medication Sig Dispense Refill  . acetaminophen (TYLENOL ARTHRITIS PAIN) 650 MG CR tablet Take 650 mg by mouth at bedtime. arthritis    . cetirizine (ZYRTEC) 10 MG tablet Take 10 mg by mouth daily.     . fenofibrate micronized (LOFIBRA) 134 MG  capsule TAKE 1 CAPSULE BEFORE BREAKFAST. 90 capsule 1  . FLUoxetine (PROZAC) 10 MG capsule TAKE (1) CAPSULE DAILY. 90 capsule 1  . glimepiride (AMARYL) 2 MG tablet Take 1 tablet (2 mg total) by mouth daily with breakfast. 90 tablet 3  . hydrochlorothiazide (MICROZIDE) 12.5 MG capsule TAKE (1) CAPSULE DAILY. 90 capsule 1  . losartan (COZAAR) 100 MG tablet TAKE 1 TABLET ONCE DAILY. 90 tablet 1  . metFORMIN (GLUCOPHAGE) 500 MG tablet TAKE 2 TABLETS TWICE DAILY WITH MEALS. 360 tablet 1  . Multiple Vitamin (MULTIVITAMIN) tablet Take 1 tablet by mouth daily.      . ONE TOUCH ULTRA TEST test strip TEST BLOOD SUGAR once daily 100 each 1  . pravastatin (PRAVACHOL) 20 MG tablet TAKE 1 TABLET ONCE DAILY. 90 tablet 1  . warfarin (COUMADIN) 2 MG tablet Take 1 tablet (2 mg total) by mouth daily. Take as directed 90 tablet 0   No current facility-administered medications on file prior to visit.     Past Medical History:  Diagnosis Date  . Atrial fibrillation (HCC)    paroxysmal, on coumadin  . Benign essential HTN   . Diabetes mellitus type II   . Hearing loss, conductive, bilateral   . Hyperlipidemia   . 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 Mobitz (type) II atrioventricular block    S/P  PPM (SJM) by Dr Rayann Heman  . Neoplasm of uncertain behavior of skin   . PVC's (premature ventricular contractions)   . 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 Sleep disorder     Past Surgical History:  Procedure Laterality Date  . 3 fatty tumors     benign  . HAND SURGERY     Right thumb amputated and reattached   . HERNIA REPAIR     Umbilical  . no colonoscopy     "did not want one" (warfarin goes against it; 11/09/13)  . PACEMAKER PLACEMENT     06/12/2008, Dr. Thompson Grayer  . SKIN CANCER EXCISION     above nose     Social History   Social History  . Marital status: Married    Spouse name: N/A  . Number of children: N/A  . Years of  education: N/A   Social History Main Topics  . Smoking status: Former Smoker    Packs/day: 2.00    Years: 10.00    Types: Cigarettes    Quit date: 04/27/1985  . Smokeless tobacco: Never Used     Comment: smoked age [age], maximum regular consumption up to 1 ppd  . Alcohol use No  . Drug use: No  . Sexual activity: Not Asked   Other Topics Concern  . None   Social History Narrative  . None    Family History  Problem Relation Age of Onset  . Heart attack Paternal Grandfather 89  . Diabetes Paternal Uncle   . Diabetes Maternal Grandfather   . Skin cancer Father   . Emphysema Father   . Coronary artery disease Father     stent  . Diabetes Maternal Aunt   . Diabetes Maternal Uncle   . Stroke Neg Hx     Review of Systems  Constitutional: Negative for fever.  HENT: Positive for hearing loss and sore throat. Negative for congestion, ear pain and sinus pressure.   Respiratory: Negative for cough, shortness of breath and wheezing.   Neurological: Negative for dizziness, light-headedness and headaches.       Objective:   Vitals:   02/27/16 1007  BP: 136/82  Pulse: 64  Temp: 97.9 F (36.6 C)   Filed Weights   02/27/16 1007  Weight: 210 lb (95.3 kg)   Body mass index is 30.13 kg/m.   Physical Exam  Constitutional: He appears well-developed and well-nourished. No distress.  HENT:  Head: Normocephalic and atraumatic.  Right Ear: External ear normal.  Left Ear: External ear normal.  Mouth/Throat: Oropharynx is clear and moist.  Bilateral ear canals with impacted cerumen. Tympanic membranes not visualized  Verbal consent given for ear lavage  CMA lavaged both ears with warm water and was able to remove 95% of the wax in both ears. He tolerated the procedure well. No erythema and ear canals or tympanic membranes. Hearing improved after procedure.  Eyes: Conjunctivae are normal.  Neck: Neck supple. No tracheal deviation present. No thyromegaly present.    Lymphadenopathy:    He has no cervical adenopathy.  Skin: Skin is warm and dry. He is not diaphoretic.          Assessment & Plan:   See Problem List for Assessment and Plan of chronic medical problems.

## 2016-02-27 NOTE — Progress Notes (Signed)
Pre visit review using our clinic review tool, if applicable. No additional management support is needed unless otherwise documented below in the visit note. 

## 2016-03-06 ENCOUNTER — Ambulatory Visit (INDEPENDENT_AMBULATORY_CARE_PROVIDER_SITE_OTHER): Payer: Medicare Other | Admitting: General Practice

## 2016-03-06 DIAGNOSIS — Z5181 Encounter for therapeutic drug level monitoring: Secondary | ICD-10-CM

## 2016-03-06 DIAGNOSIS — I4891 Unspecified atrial fibrillation: Secondary | ICD-10-CM

## 2016-03-06 LAB — POCT INR: INR: 2.8

## 2016-03-06 NOTE — Progress Notes (Signed)
I have reviewed and agree with the plan. 

## 2016-03-06 NOTE — Patient Instructions (Signed)
Pre visit review using our clinic review tool, if applicable. No additional management support is needed unless otherwise documented below in the visit note. 

## 2016-03-09 ENCOUNTER — Encounter: Payer: Self-pay | Admitting: Physician Assistant

## 2016-03-10 ENCOUNTER — Other Ambulatory Visit: Payer: Self-pay | Admitting: Internal Medicine

## 2016-03-10 MED ORDER — GLIMEPIRIDE 2 MG PO TABS: 2.0000 mg | ORAL_TABLET | Freq: Every day | ORAL | 3 refills | Status: DC

## 2016-03-12 ENCOUNTER — Encounter: Payer: Self-pay | Admitting: Internal Medicine

## 2016-03-12 ENCOUNTER — Other Ambulatory Visit: Payer: Self-pay | Admitting: Emergency Medicine

## 2016-03-12 MED ORDER — GLIMEPIRIDE 2 MG PO TABS: 2.0000 mg | ORAL_TABLET | Freq: Every day | ORAL | 3 refills | Status: DC

## 2016-03-23 ENCOUNTER — Encounter: Payer: Medicare Other | Admitting: Physician Assistant

## 2016-03-23 NOTE — Progress Notes (Signed)
Cardiology Office Note Date:  03/24/2016  Patient ID:  Tarvis, Mangels 04-19-1941, MRN [id number] PCP:  Binnie Rail, MD  Cardiologist:  Dr. Rayann Heman    Chief Complaint: annual EP clinic/device visit  History of Present Illness: DRAYCEN WEISHAUPT is a 75 y.o. male with history of Mobitz II AVBlock w/PPM, HTN, HLD, DM, Permanent AFib, comes in to the office to be seen for Dr Rayann Heman.  He was last seen by him Nov 2016, at that time, doing well, unaware of his AF and did not think he would benefit from AAD tx. Mentioned updating his echo, though the patienmt at the time wantedto hold off.  He mentions he is getting over a stomach "bug" but otherwise doing well.  States he has days that he feels like he doesn't have much energy but bounces back the next day.  He denies any kind of CP or SOB, he is very sedentary.  He explains that he and his wife last year went pretty regularly to silver sneakers though she had back surgery this year and they stopped going.  He also has stopped using his CPAP.  States he had a bronchitis late last year and was unable to use it and never resumed it.  He denies any palpitations, no dizziness, near syncope or syncope.  He describes his warfrain regime very stable, goes to the coumadin clinic.  He denies any bleeding or signs of bleeding.  He prefers his pacer checks in clinic.   Device information: SJM dual chamber PPM, implanted 06/12/08, Dr. Rayann Heman Programmed VVI, unipolar (in looking back for the last year, chronically)  Past Medical History:  Diagnosis Date  . Atrial fibrillation (HCC)    paroxysmal, on coumadin  . Benign essential HTN   . Diabetes mellitus type II   . Hearing loss, conductive, bilateral   . Hyperlipidemia   . 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 Mobitz (type) II atrioventricular block    S/P  PPM (SJM) by Dr Rayann Heman  . Neoplasm of uncertain behavior of skin   . PVC's (premature ventricular contractions)    . 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 Sleep disorder     Past Surgical History:  Procedure Laterality Date  . 3 fatty tumors     benign  . HAND SURGERY     Right thumb amputated and reattached   . HERNIA REPAIR     Umbilical  . no colonoscopy     "did not want one" (warfarin goes against it; 11/09/13)  . PACEMAKER PLACEMENT     06/12/2008, Dr. Thompson Grayer  . SKIN CANCER EXCISION     above nose     Current Outpatient Prescriptions  Medication Sig Dispense Refill  . acetaminophen (TYLENOL ARTHRITIS PAIN) 650 MG CR tablet Take 650 mg by mouth at bedtime. arthritis    . cetirizine (ZYRTEC) 10 MG tablet Take 10 mg by mouth daily.     . fenofibrate micronized (LOFIBRA) 134 MG capsule TAKE 1 CAPSULE BEFORE BREAKFAST. 90 capsule 1  . FLUoxetine (PROZAC) 10 MG capsule TAKE (1) CAPSULE DAILY. 90 capsule 1  . glimepiride (AMARYL) 2 MG tablet Take 1 tablet (2 mg total) by mouth daily with breakfast. 90 tablet 3  . hydrochlorothiazide (MICROZIDE) 12.5 MG capsule TAKE (1) CAPSULE DAILY. 90 capsule 1  . losartan (COZAAR) 100 MG tablet TAKE 1 TABLET ONCE DAILY. 90 tablet  1  . metFORMIN (GLUCOPHAGE) 500 MG tablet TAKE 2 TABLETS TWICE DAILY WITH MEALS. 360 tablet 1  . Multiple Vitamin (MULTIVITAMIN) tablet Take 1 tablet by mouth daily.      . ONE TOUCH ULTRA TEST test strip TEST BLOOD SUGAR once daily 100 each 1  . pravastatin (PRAVACHOL) 20 MG tablet TAKE 1 TABLET ONCE DAILY. 90 tablet 1  . warfarin (COUMADIN) 2 MG tablet Take 1 tablet (2 mg total) by mouth daily. Take as directed 90 tablet 0   No current facility-administered medications for this visit.     Allergies:   Amoxicillin; Ramipril; and Tramadol hcl   Social History:  The patient  reports that he quit smoking about 30 years ago. His smoking use included Cigarettes. He has a 20.00 pack-year smoking history. He has never used smokeless tobacco. He reports that he does not drink alcohol or use drugs.    Family History:  The patient's family history includes Coronary artery disease in his father; Diabetes in his maternal aunt, maternal grandfather, maternal uncle, and paternal uncle; Emphysema in his father; Heart attack (age of onset: 11) in his paternal grandfather; Skin cancer in his father.  ROS:  Please see the history of present illness.   All other systems are reviewed and otherwise negative.   PHYSICAL EXAM:  VS:  BP 120/62   Pulse 81   Ht 5\' 10"  (1.778 m)   Wt 208 lb (94.3 kg)   BMI 29.84 kg/m  BMI: Body mass index is 29.84 kg/m. Well nourished, well developed, in no acute distress  HEENT: normocephalic, atraumatic  Neck: no JVD, carotid bruits or masses Cardiac:  RRR (paced); no significant murmurs, no rubs, or gallops Lungs:  clear to auscultation bilaterally, no wheezing, rhonchi or rales  Abd: soft, nontender MS: no deformity or atrophy Ext: no edema  Skin: warm and dry, no rash Neuro:  No gross deficits appreciated Psych: euthymic mood, full affect  PPM site is stable, no tethering or discomfort   EKG:  Done today and reviewed by myself shows AFib, V paced with PVC's one intrinsic beat PPM interrogation done today and reviewed by myself: stable lead and battery measurements, no changes made.  He has an underlying rhythm  03/21/16, TTE Study Conclusions - Left ventricle: The cavity size was normal. There was mild focal basal hypertrophy of the septum. Systolic function was normal. The estimated ejection fraction was in the range of 50% to 55%. There is akinesis of the apical myocardium. - Left atrium: The atrium was mildly dilated. - Right ventricle: Systolic pressure was increased. Impressions: - When compared to prior, apical akinesis is new.  Recent Labs: 01/30/2016: ALT 25; BUN 26; Creatinine, Ser 1.48; Potassium 5.0; Sodium 135  01/30/2016: Cholesterol 159; Direct LDL 81.0; HDL 43.00; Total CHOL/HDL Ratio 4; Triglycerides 297.0; VLDL 59.4   CrCl  cannot be calculated (Patient's most recent lab result is older than the maximum 21 days allowed.).   Wt Readings from Last 3 Encounters:  03/24/16 208 lb (94.3 kg)  02/27/16 210 lb (95.3 kg)  01/30/16 208 lb (94.3 kg)     Other studies reviewed: Additional studies/records reviewed today include: summarized above  ASSESSMENT AND PLAN:  1. CHB w/PPM     stable device function     34mo device clinic  2. Permanent Afib     No symptoms     CHA2DS2Vasc is at least 3, on Warfarin  Will check CBC given a/c tx  3. HTN  Stable  Note his trigs were high at his labs in October, he states his PMD already addressed this He is sedentary, he has new year resolution plans to resume silver sneakers. He is urged to resume nightly use of his CPAP We will schedule him for an echo as suggested last year by Dr. Rayann Heman   Disposition: F/u with pacer check in device clinic in 6 months, Dr. Rayann Heman in 1 year, sooner if needed.  Current medicines are reviewed at length with the patient today.  The patient did not have any concerns regarding medicines.  Haywood Lasso, PA-C 03/24/2016 11:52 AM     [facility] [place] [address] [phone] (office)  [phone] (fax)

## 2016-03-24 ENCOUNTER — Ambulatory Visit (INDEPENDENT_AMBULATORY_CARE_PROVIDER_SITE_OTHER): Payer: Medicare Other | Admitting: Physician Assistant

## 2016-03-24 ENCOUNTER — Encounter: Payer: Self-pay | Admitting: Physician Assistant

## 2016-03-24 VITALS — BP 120/62 | HR 81 | Ht 70.0 in | Wt 208.0 lb

## 2016-03-24 DIAGNOSIS — I442 Atrioventricular block, complete: Secondary | ICD-10-CM | POA: Diagnosis not present

## 2016-03-24 DIAGNOSIS — I482 Chronic atrial fibrillation: Secondary | ICD-10-CM

## 2016-03-24 DIAGNOSIS — I10 Essential (primary) hypertension: Secondary | ICD-10-CM | POA: Diagnosis not present

## 2016-03-24 DIAGNOSIS — I4821 Permanent atrial fibrillation: Secondary | ICD-10-CM

## 2016-03-24 DIAGNOSIS — Z950 Presence of cardiac pacemaker: Secondary | ICD-10-CM | POA: Diagnosis not present

## 2016-03-24 LAB — CBC
HEMATOCRIT: 44.3 % (ref 38.5–50.0)
Hemoglobin: 15.2 g/dL (ref 13.2–17.1)
MCH: 29.6 pg (ref 27.0–33.0)
MCHC: 34.3 g/dL (ref 32.0–36.0)
MCV: 86.2 fL (ref 80.0–100.0)
MPV: 10.8 fL (ref 7.5–12.5)
Platelets: 219 10*3/uL (ref 140–400)
RBC: 5.14 MIL/uL (ref 4.20–5.80)
RDW: 13.9 % (ref 11.0–15.0)
WBC: 9.5 10*3/uL (ref 3.8–10.8)

## 2016-03-24 NOTE — Patient Instructions (Addendum)
Medication Instructions:   Your physician recommends that you continue on your current medications as directed. Please refer to the Current Medication list given to you today.   If you need a refill on your cardiac medications before your next appointment, please call your pharmacy.  Labwork:  CBC TODAY   Testing/Procedures:  BEFORE THE END OF YEAR .Marland KitchenYour physician has requested that you have an echocardiogram. Echocardiography is a painless test that uses sound waves to create images of your heart. It provides your doctor with information about the size and shape of your heart and how well your heart's chambers and valves are working. This procedure takes approximately one hour. There are no restrictions for this procedure.     Follow-Up: Your physician wants you to follow-up in:  IN  [place] will receive a reminder letter in the mail two months in advance. If you don't receive a letter, please call our office to schedule the follow-up appointment.   Your physician wants you to follow-up in: [place] will receive a reminder letter in the mail two months in advance. If you don't receive a letter, please call our office to schedule the follow-up appointment.    Any Other Special Instructions Will Be Listed Below (If Applicable).

## 2016-04-15 ENCOUNTER — Ambulatory Visit (HOSPITAL_COMMUNITY): Payer: Medicare Other | Attending: Internal Medicine

## 2016-04-15 ENCOUNTER — Other Ambulatory Visit: Payer: Self-pay

## 2016-04-15 DIAGNOSIS — I358 Other nonrheumatic aortic valve disorders: Secondary | ICD-10-CM | POA: Insufficient documentation

## 2016-04-15 DIAGNOSIS — I517 Cardiomegaly: Secondary | ICD-10-CM | POA: Insufficient documentation

## 2016-04-15 DIAGNOSIS — I4891 Unspecified atrial fibrillation: Secondary | ICD-10-CM | POA: Diagnosis present

## 2016-04-15 DIAGNOSIS — Z950 Presence of cardiac pacemaker: Secondary | ICD-10-CM | POA: Insufficient documentation

## 2016-04-15 DIAGNOSIS — I482 Chronic atrial fibrillation: Secondary | ICD-10-CM | POA: Insufficient documentation

## 2016-04-15 DIAGNOSIS — I071 Rheumatic tricuspid insufficiency: Secondary | ICD-10-CM | POA: Diagnosis not present

## 2016-04-15 DIAGNOSIS — I341 Nonrheumatic mitral (valve) prolapse: Secondary | ICD-10-CM | POA: Diagnosis not present

## 2016-04-15 DIAGNOSIS — I340 Nonrheumatic mitral (valve) insufficiency: Secondary | ICD-10-CM | POA: Diagnosis not present

## 2016-04-15 DIAGNOSIS — I4821 Permanent atrial fibrillation: Secondary | ICD-10-CM

## 2016-04-17 ENCOUNTER — Ambulatory Visit (INDEPENDENT_AMBULATORY_CARE_PROVIDER_SITE_OTHER): Payer: Medicare Other | Admitting: General Practice

## 2016-04-17 DIAGNOSIS — I4891 Unspecified atrial fibrillation: Secondary | ICD-10-CM | POA: Diagnosis not present

## 2016-04-17 DIAGNOSIS — Z5181 Encounter for therapeutic drug level monitoring: Secondary | ICD-10-CM

## 2016-04-17 LAB — POCT INR: INR: 2.5

## 2016-04-17 NOTE — Patient Instructions (Signed)
Pre visit review using our clinic review tool, if applicable. No additional management support is needed unless otherwise documented below in the visit note. 

## 2016-04-18 NOTE — Progress Notes (Signed)
Agree with management.  Albert Iannone J Jazmyne Beauchesne, MD  

## 2016-05-29 ENCOUNTER — Ambulatory Visit (INDEPENDENT_AMBULATORY_CARE_PROVIDER_SITE_OTHER): Payer: PPO | Admitting: General Practice

## 2016-05-29 DIAGNOSIS — Z5181 Encounter for therapeutic drug level monitoring: Secondary | ICD-10-CM

## 2016-05-29 DIAGNOSIS — I4891 Unspecified atrial fibrillation: Secondary | ICD-10-CM

## 2016-05-29 LAB — POCT INR: INR: 3.7

## 2016-05-29 NOTE — Progress Notes (Signed)
I have reviewed and agree with the plan. 

## 2016-05-29 NOTE — Patient Instructions (Signed)
Pre visit review using our clinic review tool, if applicable. No additional management support is needed unless otherwise documented below in the visit note. 

## 2016-06-25 ENCOUNTER — Other Ambulatory Visit: Payer: Self-pay | Admitting: Internal Medicine

## 2016-07-01 ENCOUNTER — Other Ambulatory Visit: Payer: Self-pay | Admitting: Internal Medicine

## 2016-07-09 ENCOUNTER — Other Ambulatory Visit: Payer: Self-pay | Admitting: Internal Medicine

## 2016-07-10 ENCOUNTER — Ambulatory Visit (INDEPENDENT_AMBULATORY_CARE_PROVIDER_SITE_OTHER): Payer: PPO | Admitting: General Practice

## 2016-07-10 DIAGNOSIS — Z5181 Encounter for therapeutic drug level monitoring: Secondary | ICD-10-CM

## 2016-07-10 DIAGNOSIS — I4891 Unspecified atrial fibrillation: Secondary | ICD-10-CM

## 2016-07-10 LAB — POCT INR
INR: 3.2
INR: 3.2

## 2016-07-10 NOTE — Progress Notes (Signed)
I have reviewed and agree with the plan. 

## 2016-07-10 NOTE — Patient Instructions (Signed)
Pre visit review using our clinic review tool, if applicable. No additional management support is needed unless otherwise documented below in the visit note. 

## 2016-08-03 ENCOUNTER — Ambulatory Visit: Payer: Medicare Other | Admitting: Internal Medicine

## 2016-08-07 ENCOUNTER — Ambulatory Visit (INDEPENDENT_AMBULATORY_CARE_PROVIDER_SITE_OTHER): Payer: PPO | Admitting: General Practice

## 2016-08-07 DIAGNOSIS — Z5181 Encounter for therapeutic drug level monitoring: Secondary | ICD-10-CM | POA: Diagnosis not present

## 2016-08-07 LAB — POCT INR: INR: 2

## 2016-08-07 NOTE — Patient Instructions (Signed)
Pre visit review using our clinic review tool, if applicable. No additional management support is needed unless otherwise documented below in the visit note. 

## 2016-08-07 NOTE — Progress Notes (Signed)
I have reviewed and agree with the plan. 

## 2016-08-16 NOTE — Progress Notes (Signed)
Subjective:    Patient ID: Albert Hawkins, male    DOB: [birth year], 76 y.o.   MRN: [id number]  HPI The patient is here for follow up.  Hypertension: He is taking his medication daily. He is compliant with a low sodium diet.  He denies chest pain, palpitations, edema, shortness of breath and regular headaches. He is not exercising regularly.     Diabetes: He is taking his medication daily as prescribed. He is compliant with a diabetic diet. He is not exercising regularly.  He is up-to-date with an ophthalmology examination.   Depression: He is taking his medication daily as prescribed. He denies any side effects from the medication. He feels his depression is well controlled and he is happy with his current dose of medication.   OSA:  At his last visit he was not using cpap.  He is still not using it.  He thinks he needs to go back on it - it made him feel better.   Medications and allergies reviewed with patient and updated if appropriate.  Patient Active Problem List   Diagnosis Date Noted  . Obesity (BMI 30-39.9) 11/09/2013  . Encounter for therapeutic drug monitoring 06/06/2013  . Complete heart block ([place]) 03/03/2013  . OSA (obstructive sleep apnea) 03/28/2012  . 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 Periodic limb movement disorder (PLMD) 12/25/2011  . Depression 01/13/2010  . GERD 05/27/2009  . LUMBAR RADICULOPATHY 07/20/2008  . Second degree AV block, Mobitz type II 07/18/2008  . Essential hypertension 01/16/2008  . 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 ABNORMAL CHEST XRAY 11/20/2007  . Atrial fibrillation ([place]) 11/17/2007  . HEARING LOSS, CONDUCTIVE, BILATERAL 04/14/2007  . DM (diabetes mellitus), secondary, uncontrolled, with peripheral vascular complications ([place]) 33/00/7622  . Hyperlipidemia 03/17/2007  . 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 SKIN CANCER, HX OF 05/17/2006    Current Outpatient Prescriptions on File Prior to Visit    Medication Sig Dispense Refill  . acetaminophen (TYLENOL ARTHRITIS PAIN) 650 MG CR tablet Take 650 mg by mouth at bedtime. arthritis    . cetirizine (ZYRTEC) 10 MG tablet Take 10 mg by mouth daily.     . fenofibrate micronized (LOFIBRA) 134 MG capsule TAKE 1 CAPSULE BEFORE BREAKFAST. 90 capsule 0  . FLUoxetine (PROZAC) 10 MG capsule TAKE (1) CAPSULE DAILY. 90 capsule 0  . glimepiride (AMARYL) 2 MG tablet Take 1 tablet (2 mg total) by mouth daily with breakfast. 90 tablet 3  . hydrochlorothiazide (MICROZIDE) 12.5 MG capsule TAKE (1) CAPSULE DAILY. 90 capsule 0  . losartan (COZAAR) 100 MG tablet TAKE 1 TABLET ONCE DAILY. 90 tablet 0  . metFORMIN (GLUCOPHAGE) 500 MG tablet TAKE 2 TABLETS TWICE DAILY WITH MEALS. 360 tablet 0  . Multiple Vitamin (MULTIVITAMIN) tablet Take 1 tablet by mouth daily.      . ONE TOUCH ULTRA TEST test strip CHECK BLOOD SUGAR ONCE A DAY. 100 each 2  . pravastatin (PRAVACHOL) 20 MG tablet TAKE 1 TABLET ONCE DAILY. 90 tablet 0  . warfarin (COUMADIN) 2 MG tablet TAKE (1) TABLET DAILY AS DIRECTED. 90 tablet 0   No current facility-administered medications on file prior to visit.     Past Medical History:  Diagnosis Date  . Atrial fibrillation (HCC)    paroxysmal, on coumadin  . Benign essential HTN   . Diabetes mellitus type II   . Hearing loss, conductive, bilateral   . Hyperlipidemia   . Hyperplasia of prostate without lower urinary  tract symptoms (LUTS)   . Mobitz (type) II atrioventricular block    S/P  PPM (SJM) by Dr Rayann Heman  . Neoplasm of uncertain behavior of skin   . PVC's (premature ventricular contractions)   . 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 Sleep disorder     Past Surgical History:  Procedure Laterality Date  . 3 fatty tumors     benign  . HAND SURGERY     Right thumb amputated and reattached   . HERNIA REPAIR     Umbilical  . no colonoscopy     "did not want one" (warfarin goes against it; 11/09/13)  .  PACEMAKER PLACEMENT     06/12/2008, Dr. Thompson Grayer  . SKIN CANCER EXCISION     above nose     Social History   Social History  . Marital status: Married    Spouse name: N/A  . Number of children: N/A  . Years of education: N/A   Social History Main Topics  . Smoking status: Former Smoker    Packs/day: 2.00    Years: 10.00    Types: Cigarettes    Quit date: 04/27/1985  . Smokeless tobacco: Never Used     Comment: smoked age [age], maximum regular consumption up to 1 ppd  . Alcohol use No  . Drug use: No  . Sexual activity: Not on file   Other Topics Concern  . Not on file   Social History Narrative  . No narrative on file    Family History  Problem Relation Age of Onset  . Heart attack Paternal Grandfather 89  . Diabetes Paternal Uncle   . Diabetes Maternal Grandfather   . Skin cancer Father   . Emphysema Father   . Coronary artery disease Father     stent  . Diabetes Maternal Aunt   . Diabetes Maternal Uncle   . Stroke Neg Hx     Review of Systems  Constitutional: Negative for chills and fever.  Respiratory: Negative for cough, shortness of breath and wheezing.   Cardiovascular: Negative for chest pain, palpitations and leg swelling.  Neurological: Negative for light-headedness and headaches.       Objective:   Vitals:   08/17/16 1103  BP: 122/70  Pulse: (!) 57  Resp: 16  Temp: 97.6 F (36.4 C)   Wt Readings from Last 3 Encounters:  08/17/16 207 lb (93.9 kg)  03/24/16 208 lb (94.3 kg)  02/27/16 210 lb (95.3 kg)   Body mass index is 29.7 kg/m.   Physical Exam    Constitutional: Appears well-developed and well-nourished. No distress.  HENT:  Head: Normocephalic and atraumatic.  Neck: Neck supple. No tracheal deviation present. No thyromegaly present.  No cervical lymphadenopathy Cardiovascular: Normal rate, regular rhythm and normal heart sounds.   No murmur heard. No carotid bruit .  No edema Pulmonary/Chest: Effort normal and breath  sounds normal. No respiratory distress. No has no wheezes. No rales.  Skin: Skin is warm and dry. Not diaphoretic.  Psychiatric: Normal mood and affect. Behavior is normal.      Assessment & Plan:    See Problem List for Assessment and Plan of chronic medical problems.   FU 6 months

## 2016-08-16 NOTE — Patient Instructions (Addendum)
Check with your insurance to see if Cologuard is covered - it is a screening test for colon cancer.   Test(s) ordered today. Your results will be released to Albert Hawkins (or called to you) after review, usually within 72hours after test completion. If any changes need to be made, you will be notified at that same time.  All other Health Maintenance issues reviewed.   All recommended immunizations and age-appropriate screenings are up-to-date or discussed.  No immunizations administered today.   Medications reviewed and updated.  No changes recommended at this time.   Please followup in 6 months

## 2016-08-17 ENCOUNTER — Ambulatory Visit (INDEPENDENT_AMBULATORY_CARE_PROVIDER_SITE_OTHER): Payer: PPO | Admitting: Internal Medicine

## 2016-08-17 ENCOUNTER — Encounter: Payer: Self-pay | Admitting: Internal Medicine

## 2016-08-17 ENCOUNTER — Other Ambulatory Visit (INDEPENDENT_AMBULATORY_CARE_PROVIDER_SITE_OTHER): Payer: PPO

## 2016-08-17 VITALS — BP 122/70 | HR 57 | Temp 97.6°F | Resp 16 | Wt 207.0 lb

## 2016-08-17 DIAGNOSIS — IMO0002 Reserved for concepts with insufficient information to code with codable children: Secondary | ICD-10-CM

## 2016-08-17 DIAGNOSIS — E1365 Other specified diabetes mellitus with hyperglycemia: Secondary | ICD-10-CM | POA: Diagnosis not present

## 2016-08-17 DIAGNOSIS — E1351 Other specified diabetes mellitus with diabetic peripheral angiopathy without gangrene: Secondary | ICD-10-CM | POA: Diagnosis not present

## 2016-08-17 DIAGNOSIS — F32A Depression, unspecified: Secondary | ICD-10-CM

## 2016-08-17 DIAGNOSIS — F329 Major depressive disorder, single episode, unspecified: Secondary | ICD-10-CM

## 2016-08-17 DIAGNOSIS — I10 Essential (primary) hypertension: Secondary | ICD-10-CM

## 2016-08-17 DIAGNOSIS — E7800 Pure hypercholesterolemia, unspecified: Secondary | ICD-10-CM

## 2016-08-17 DIAGNOSIS — G4733 Obstructive sleep apnea (adult) (pediatric): Secondary | ICD-10-CM

## 2016-08-17 LAB — LIPID PANEL
Cholesterol: 154 mg/dL (ref 0–200)
HDL: 44.8 mg/dL (ref >39.00)
NONHDL: 109.24
Total CHOL/HDL Ratio: 3
Triglycerides: 222 mg/dL — ABNORMAL HIGH (ref 0.0–149.0)
VLDL: 44.4 mg/dL — AB (ref 0.0–40.0)

## 2016-08-17 LAB — COMPREHENSIVE METABOLIC PANEL
ALT: 19 U/L (ref 0–53)
AST: 24 U/L (ref 0–37)
Albumin: 4.3 g/dL (ref 3.5–5.2)
Alkaline Phosphatase: 35 U/L — ABNORMAL LOW (ref 39–117)
BUN: 25 mg/dL — AB (ref 6–23)
CALCIUM: 9.9 mg/dL (ref 8.4–10.5)
CHLORIDE: 96 meq/L (ref 96–112)
CO2: 28 meq/L (ref 19–32)
CREATININE: 1.45 mg/dL (ref 0.40–1.50)
GFR: 50.64 mL/min — ABNORMAL LOW (ref >60.00)
Glucose, Bld: 190 mg/dL — ABNORMAL HIGH (ref 70–99)
Potassium: 4.3 mEq/L (ref 3.5–5.1)
SODIUM: 133 meq/L — AB (ref 135–145)
Total Bilirubin: 1 mg/dL (ref 0.2–1.2)
Total Protein: 6.8 g/dL (ref 6.0–8.3)

## 2016-08-17 LAB — LDL CHOLESTEROL, DIRECT: LDL DIRECT: 83 mg/dL

## 2016-08-17 LAB — HEMOGLOBIN A1C: Hgb A1c MFr Bld: 7.7 % — ABNORMAL HIGH (ref 4.6–6.5)

## 2016-08-17 NOTE — Assessment & Plan Note (Signed)
BP well controlled Current regimen effective and well tolerated Continue current medications at current doses cmp  

## 2016-08-17 NOTE — Progress Notes (Signed)
Pre visit review using our clinic review tool, if applicable. No additional management support is needed unless otherwise documented below in the visit note. 

## 2016-08-17 NOTE — Assessment & Plan Note (Signed)
GERD controlled Continue daily medication  

## 2016-08-17 NOTE — Assessment & Plan Note (Signed)
Check a1c Low sugar / carb diet Stressed regular exercise, weight loss  

## 2016-08-17 NOTE — Assessment & Plan Note (Signed)
Check lipid panel  Continue daily statin Regular exercise and healthy diet encouraged  

## 2016-08-17 NOTE — Assessment & Plan Note (Signed)
Controlled, stable Continue current dose of medication  

## 2016-08-17 NOTE — Assessment & Plan Note (Signed)
Not using cpap  Discussed importance of getting back on cpap

## 2016-09-04 ENCOUNTER — Ambulatory Visit (INDEPENDENT_AMBULATORY_CARE_PROVIDER_SITE_OTHER): Payer: PPO | Admitting: General Practice

## 2016-09-04 DIAGNOSIS — Z5181 Encounter for therapeutic drug level monitoring: Secondary | ICD-10-CM | POA: Diagnosis not present

## 2016-09-04 DIAGNOSIS — I4891 Unspecified atrial fibrillation: Secondary | ICD-10-CM

## 2016-09-04 LAB — POCT INR: INR: 2.1

## 2016-09-04 NOTE — Patient Instructions (Signed)
Pre visit review using our clinic review tool, if applicable. No additional management support is needed unless otherwise documented below in the visit note. 

## 2016-09-04 NOTE — Progress Notes (Signed)
I have reviewed and agree with the plan. 

## 2016-09-10 DIAGNOSIS — L821 Other seborrheic keratosis: Secondary | ICD-10-CM | POA: Diagnosis not present

## 2016-09-10 DIAGNOSIS — D1801 Hemangioma of skin and subcutaneous tissue: Secondary | ICD-10-CM | POA: Diagnosis not present

## 2016-09-10 DIAGNOSIS — D225 Melanocytic nevi of trunk: Secondary | ICD-10-CM | POA: Diagnosis not present

## 2016-09-10 DIAGNOSIS — Z85828 Personal history of other malignant neoplasm of skin: Secondary | ICD-10-CM | POA: Diagnosis not present

## 2016-09-10 DIAGNOSIS — L814 Other melanin hyperpigmentation: Secondary | ICD-10-CM | POA: Diagnosis not present

## 2016-09-10 DIAGNOSIS — L409 Psoriasis, unspecified: Secondary | ICD-10-CM | POA: Diagnosis not present

## 2016-09-10 DIAGNOSIS — L570 Actinic keratosis: Secondary | ICD-10-CM | POA: Diagnosis not present

## 2016-09-10 DIAGNOSIS — L820 Inflamed seborrheic keratosis: Secondary | ICD-10-CM | POA: Diagnosis not present

## 2016-09-14 ENCOUNTER — Ambulatory Visit (INDEPENDENT_AMBULATORY_CARE_PROVIDER_SITE_OTHER): Payer: PPO | Admitting: *Deleted

## 2016-09-14 DIAGNOSIS — I442 Atrioventricular block, complete: Secondary | ICD-10-CM

## 2016-09-14 LAB — CUP PACEART INCLINIC DEVICE CHECK
Battery Voltage: 2.78 V
Date Time Interrogation Session: 20180521112155
Implantable Lead Implant Date: 20100216
Implantable Lead Location: 753860
Lead Channel Setting Pacing Amplitude: 2.5 V
Lead Channel Setting Pacing Pulse Width: 0.4 ms
Lead Channel Setting Sensing Sensitivity: 2 mV
MDC IDC LEAD IMPLANT DT: 20100216
MDC IDC LEAD LOCATION: 753859
MDC IDC MSMT BATTERY IMPEDANCE: 2200 Ohm
MDC IDC MSMT LEADCHNL RV IMPEDANCE VALUE: 305 Ohm
MDC IDC MSMT LEADCHNL RV PACING THRESHOLD AMPLITUDE: 1 V
MDC IDC MSMT LEADCHNL RV PACING THRESHOLD PULSEWIDTH: 0.4 ms
MDC IDC MSMT LEADCHNL RV SENSING INTR AMPL: 10.5 mV
MDC IDC PG IMPLANT DT: 20100216
Pulse Gen Model: 5826
Pulse Gen Serial Number: 1285538

## 2016-09-14 NOTE — Progress Notes (Signed)
Pacemaker check in clinic. Normal device function. Threshold, sensing, and impedance consistent with previous measurements. Device programmed to maximize longevity. Device programmed at appropriate safety margins. Histogram distribution appropriate for patient activity level. Device programmed to optimize intrinsic conduction. Estimated longevity 4-5 years. Patient will follow up with JA in 6 months.

## 2016-09-22 ENCOUNTER — Other Ambulatory Visit: Payer: Self-pay | Admitting: Internal Medicine

## 2016-10-08 NOTE — Progress Notes (Signed)
Subjective:    Patient ID: Albert Hawkins, male    DOB: May 08, 1940, 76 y.o.   MRN: [id number]  HPI He is here for an acute visit for cold symptoms.  His symptoms started one week ago.  Her grandson, daughter and wife all have this.   He is experiencing minimally productive cough, soreness from the cough, wheezing, headaches, lightehadness/dizziness  He has tried taking nyquil and robitussin, but they have not helped.   He has been more active.  He is playing more golf.  His appetite has decreased and he has lost some weight.  He wants to try to avoid more medications for his diabetes.    Medications and allergies reviewed with patient and updated if appropriate.  Patient Active Problem List   Diagnosis Date Noted  . Obesity (BMI 30-39.9) 11/09/2013  . Encounter for therapeutic drug monitoring 06/06/2013  . Complete heart block ([place]) 03/03/2013  . OSA (obstructive sleep apnea) 03/28/2012  . 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 Periodic limb movement disorder (PLMD) 12/25/2011  . Depression 01/13/2010  . LUMBAR RADICULOPATHY 07/20/2008  . Second degree AV block, Mobitz type II 07/18/2008  . Essential hypertension 01/16/2008  . 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 ABNORMAL CHEST XRAY 11/20/2007  . Atrial fibrillation ([place]) 11/17/2007  . HEARING LOSS, CONDUCTIVE, BILATERAL 04/14/2007  . DM (diabetes mellitus), secondary, uncontrolled, with peripheral vascular complications ([place]) 02/63/7858  . Hyperlipidemia 03/17/2007  . 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 SKIN CANCER, HX OF 05/17/2006    Current Outpatient Prescriptions on File Prior to Visit  Medication Sig Dispense Refill  . acetaminophen (TYLENOL ARTHRITIS PAIN) 650 MG CR tablet Take 650 mg by mouth at bedtime. arthritis    . cetirizine (ZYRTEC) 10 MG tablet Take 10 mg by mouth daily.     . fenofibrate micronized (LOFIBRA) 134 MG capsule TAKE 1 CAPSULE BEFORE BREAKFAST. 90 capsule 3  .  FLUoxetine (PROZAC) 10 MG capsule TAKE (1) CAPSULE DAILY. 90 capsule 1  . glimepiride (AMARYL) 2 MG tablet Take 1 tablet (2 mg total) by mouth daily with breakfast. 90 tablet 3  . hydrochlorothiazide (MICROZIDE) 12.5 MG capsule TAKE (1) CAPSULE DAILY. 90 capsule 3  . losartan (COZAAR) 100 MG tablet TAKE 1 TABLET ONCE DAILY. 90 tablet 3  . metFORMIN (GLUCOPHAGE) 500 MG tablet TAKE 2 TABLETS TWICE DAILY WITH MEALS. 360 tablet 1  . Multiple Vitamin (MULTIVITAMIN) tablet Take 1 tablet by mouth daily.      . ONE TOUCH ULTRA TEST test strip CHECK BLOOD SUGAR ONCE A DAY. 100 each 2  . pravastatin (PRAVACHOL) 20 MG tablet TAKE 1 TABLET ONCE DAILY. 90 tablet 3  . warfarin (COUMADIN) 2 MG tablet TAKE (1) TABLET DAILY AS DIRECTED. 90 tablet 1   No current facility-administered medications on file prior to visit.     Past Medical History:  Diagnosis Date  . Atrial fibrillation (HCC)    paroxysmal, on coumadin  . Benign essential HTN   . Diabetes mellitus type II   . Hearing loss, conductive, bilateral   . Hyperlipidemia   . 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 Mobitz (type) II atrioventricular block    S/P  PPM (SJM) by Dr Rayann Heman  . Neoplasm of uncertain behavior of skin   . PVC's (premature ventricular contractions)   . 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 Sleep disorder  Past Surgical History:  Procedure Laterality Date  . 3 fatty tumors     benign  . HAND SURGERY     Right thumb amputated and reattached   . HERNIA REPAIR     Umbilical  . no colonoscopy     "did not want one" (warfarin goes against it; 11/09/13)  . PACEMAKER PLACEMENT     06/12/2008, Dr. Thompson Grayer  . SKIN CANCER EXCISION     above nose     Social History   Social History  . Marital status: Married    Spouse name: N/A  . Number of children: N/A  . Years of education: N/A   Social History Main Topics  . Smoking status: Former Smoker     Packs/day: 2.00    Years: 10.00    Types: Cigarettes    Quit date: 04/27/1985  . Smokeless tobacco: Never Used     Comment: smoked age [age], maximum regular consumption up to 1 ppd  . Alcohol use No  . Drug use: No  . Sexual activity: Not Asked   Other Topics Concern  . None   Social History Narrative  . None    Family History  Problem Relation Age of Onset  . Heart attack Paternal Grandfather 89  . Diabetes Paternal Uncle   . Diabetes Maternal Grandfather   . Skin cancer Father   . Emphysema Father   . Coronary artery disease Father        stent  . Diabetes Maternal Aunt   . Diabetes Maternal Uncle   . Stroke Neg Hx     Review of Systems  Constitutional: Negative for chills and fever.  HENT: Negative for congestion, ear pain, sinus pain, sinus pressure and sore throat (initially, but resolved).   Respiratory: Positive for cough and wheezing. Negative for shortness of breath.   Gastrointestinal: Negative for diarrhea and nausea.  Neurological: Positive for dizziness, light-headedness and headaches.       Objective:   Vitals:   10/09/16 1047  BP: 126/64  Pulse: 90  Resp: 18  Temp: 98.5 F (36.9 C)   Filed Weights   10/09/16 1047  Weight: 205 lb (93 kg)   Body mass index is 29.41 kg/m.  Wt Readings from Last 3 Encounters:  10/09/16 205 lb (93 kg)  08/17/16 207 lb (93.9 kg)  03/24/16 208 lb (94.3 kg)     Physical Exam GENERAL APPEARANCE: Appears stated age, well appearing, NAD EYES: conjunctiva clear, no icterus HEENT: bilateral tympanic membranes and ear canals normal, oropharynx with mild erythema, no thyromegaly, trachea midline, no cervical or supraclavicular lymphadenopathy LUNGS: Clear to auscultation without wheeze or crackles, unlabored breathing, good air entry bilaterally HEART: Normal S8,P1; 2/6 systolic murmurs EXTREMITIES: Without clubbing, cyanosis, or edema      Assessment & Plan:   See Problem List for Assessment and Plan of  chronic medical problems.

## 2016-10-09 ENCOUNTER — Ambulatory Visit (INDEPENDENT_AMBULATORY_CARE_PROVIDER_SITE_OTHER): Payer: PPO | Admitting: Internal Medicine

## 2016-10-09 ENCOUNTER — Ambulatory Visit (INDEPENDENT_AMBULATORY_CARE_PROVIDER_SITE_OTHER): Payer: PPO | Admitting: General Practice

## 2016-10-09 ENCOUNTER — Encounter: Payer: Self-pay | Admitting: Internal Medicine

## 2016-10-09 VITALS — BP 126/64 | HR 90 | Temp 98.5°F | Resp 18 | Wt 205.0 lb

## 2016-10-09 DIAGNOSIS — J209 Acute bronchitis, unspecified: Secondary | ICD-10-CM | POA: Diagnosis not present

## 2016-10-09 DIAGNOSIS — Z5181 Encounter for therapeutic drug level monitoring: Secondary | ICD-10-CM

## 2016-10-09 DIAGNOSIS — I4891 Unspecified atrial fibrillation: Secondary | ICD-10-CM

## 2016-10-09 LAB — POCT INR: INR: 3.4

## 2016-10-09 MED ORDER — HYDROCODONE-HOMATROPINE 5-1.5 MG/5ML PO SYRP: 5.0000 mL | ORAL_SOLUTION | Freq: Three times a day (TID) | ORAL | 0 refills | Status: DC | PRN

## 2016-10-09 MED ORDER — CEFDINIR 300 MG PO CAPS: 300.0000 mg | ORAL_CAPSULE | Freq: Two times a day (BID) | ORAL | 0 refills | Status: DC

## 2016-10-09 NOTE — Progress Notes (Signed)
Agree with management.  Sherwood Castilla J Klohe Lovering, MD  

## 2016-10-09 NOTE — Patient Instructions (Addendum)
Take the antibiotic as prescribed.  A prescription for cough syrup was given.   Take over the counter cold medication as needed.  Rest, fluids.   Call if no improvement

## 2016-10-09 NOTE — Assessment & Plan Note (Signed)
Concern for bacterial infection Start omnicef hydcodan syrup for nighttime  Rest, fluids Call if no improvement

## 2016-10-23 ENCOUNTER — Encounter: Payer: Self-pay | Admitting: Internal Medicine

## 2016-10-29 MED ORDER — BENZONATATE 200 MG PO CAPS: 200.0000 mg | ORAL_CAPSULE | Freq: Three times a day (TID) | ORAL | 0 refills | Status: DC | PRN

## 2016-10-29 NOTE — Addendum Note (Signed)
Addended by: Binnie Rail on: 10/29/2016 05:21 PM   Modules accepted: Orders

## 2016-11-04 ENCOUNTER — Telehealth: Payer: Self-pay | Admitting: Internal Medicine

## 2016-11-04 MED ORDER — BENZONATATE 200 MG PO CAPS: 200.0000 mg | ORAL_CAPSULE | Freq: Three times a day (TID) | ORAL | 0 refills | Status: DC | PRN

## 2016-11-04 NOTE — Telephone Encounter (Signed)
Please resend benzonatate (TESSALON) 200 MG capsule Same pharmacy

## 2016-11-06 ENCOUNTER — Ambulatory Visit (INDEPENDENT_AMBULATORY_CARE_PROVIDER_SITE_OTHER): Payer: PPO | Admitting: General Practice

## 2016-11-06 DIAGNOSIS — Z5181 Encounter for therapeutic drug level monitoring: Secondary | ICD-10-CM | POA: Diagnosis not present

## 2016-11-06 DIAGNOSIS — I4891 Unspecified atrial fibrillation: Secondary | ICD-10-CM

## 2016-11-06 LAB — POCT INR: INR: 2.2

## 2016-11-06 NOTE — Patient Instructions (Signed)
Pre visit review using our clinic review tool, if applicable. No additional management support is needed unless otherwise documented below in the visit note. 

## 2016-12-03 NOTE — Patient Instructions (Signed)
Pre visit review using our clinic review tool, if applicable. No additional management support is needed unless otherwise documented below in the visit note. 

## 2016-12-04 ENCOUNTER — Ambulatory Visit (INDEPENDENT_AMBULATORY_CARE_PROVIDER_SITE_OTHER): Payer: PPO | Admitting: General Practice

## 2016-12-04 DIAGNOSIS — Z5181 Encounter for therapeutic drug level monitoring: Secondary | ICD-10-CM | POA: Diagnosis not present

## 2016-12-04 DIAGNOSIS — I4891 Unspecified atrial fibrillation: Secondary | ICD-10-CM

## 2016-12-04 LAB — POCT INR: INR: 2.6

## 2016-12-17 ENCOUNTER — Other Ambulatory Visit: Payer: Self-pay | Admitting: Internal Medicine

## 2017-01-15 ENCOUNTER — Ambulatory Visit (INDEPENDENT_AMBULATORY_CARE_PROVIDER_SITE_OTHER): Payer: PPO | Admitting: General Practice

## 2017-01-15 DIAGNOSIS — Z5181 Encounter for therapeutic drug level monitoring: Secondary | ICD-10-CM

## 2017-01-15 DIAGNOSIS — I4891 Unspecified atrial fibrillation: Secondary | ICD-10-CM

## 2017-01-15 DIAGNOSIS — Z23 Encounter for immunization: Secondary | ICD-10-CM | POA: Diagnosis not present

## 2017-01-15 DIAGNOSIS — Z7901 Long term (current) use of anticoagulants: Secondary | ICD-10-CM

## 2017-01-15 LAB — POCT INR: INR: 2.7

## 2017-01-15 NOTE — Patient Instructions (Signed)
Pre visit review using our clinic review tool, if applicable. No additional management support is needed unless otherwise documented below in the visit note. 

## 2017-01-15 NOTE — Progress Notes (Signed)
I have reviewed and agree with the plan. 

## 2017-01-21 NOTE — Progress Notes (Signed)
Subjective:    Patient ID: Albert Hawkins, male    DOB: [birth year], 76 y.o.   MRN: [id number]  HPI The patient is here for follow up.  Diabetes: He is taking his medication daily as prescribed. He is not compliant with a diabetic diet. He is not exercising regularly.  He checks his feet daily and denies foot lesions. He is up-to-date with an ophthalmology examination.   Hypertension: He is taking his medication daily. He is compliant with a low sodium diet.  He denies chest pain, palpitations, edema, shortness of breath and regular headaches. He is not exercising regularly.      Hyperlipidemia: He is taking his medication daily. He is somewhat compliant with a low fat/cholesterol diet. He is not exercising regularly.    Depression: He is taking his medication daily as prescribed. He denies any side effects from the medication. He feels his depression is well controlled and he is happy with his current dose of medication.    Medications and allergies reviewed with patient and updated if appropriate.  Patient Active Problem List   Diagnosis Date Noted  . Long term (current) use of anticoagulants 01/15/2017  . Obesity (BMI 30-39.9) 11/09/2013  . Encounter for therapeutic drug monitoring 06/06/2013  . Complete heart block ([place]) 03/03/2013  . OSA (obstructive sleep apnea) 03/28/2012  . 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 Periodic limb movement disorder (PLMD) 12/25/2011  . Depression 01/13/2010  . LUMBAR RADICULOPATHY 07/20/2008  . Second degree AV block, Mobitz type II 07/18/2008  . Essential hypertension 01/16/2008  . 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 ABNORMAL CHEST XRAY 11/20/2007  . Atrial fibrillation ([place]) 11/17/2007  . HEARING LOSS, CONDUCTIVE, BILATERAL 04/14/2007  . DM (diabetes mellitus), secondary, uncontrolled, with peripheral vascular complications ([place]) 57/84/6962  . Hyperlipidemia 03/17/2007  . 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  SKIN CANCER, HX OF 05/17/2006    Current Outpatient Prescriptions on File Prior to Visit  Medication Sig Dispense Refill  . acetaminophen (TYLENOL ARTHRITIS PAIN) 650 MG CR tablet Take 650 mg by mouth at bedtime. arthritis    . cetirizine (ZYRTEC) 10 MG tablet Take 10 mg by mouth daily.     . fenofibrate micronized (LOFIBRA) 134 MG capsule TAKE 1 CAPSULE BEFORE BREAKFAST. 90 capsule 3  . FLUoxetine (PROZAC) 10 MG capsule TAKE (1) CAPSULE DAILY. 90 capsule 1  . glimepiride (AMARYL) 2 MG tablet Take 1 tablet (2 mg total) by mouth daily with breakfast. -- Office visit needed for further refills 90 tablet 0  . hydrochlorothiazide (MICROZIDE) 12.5 MG capsule TAKE (1) CAPSULE DAILY. 90 capsule 3  . losartan (COZAAR) 100 MG tablet TAKE 1 TABLET ONCE DAILY. 90 tablet 3  . metFORMIN (GLUCOPHAGE) 500 MG tablet TAKE 2 TABLETS TWICE DAILY WITH MEALS. 360 tablet 1  . Multiple Vitamin (MULTIVITAMIN) tablet Take 1 tablet by mouth daily.      . ONE TOUCH ULTRA TEST test strip CHECK BLOOD SUGAR ONCE A DAY. 100 each 2  . pravastatin (PRAVACHOL) 20 MG tablet TAKE 1 TABLET ONCE DAILY. 90 tablet 3  . warfarin (COUMADIN) 2 MG tablet TAKE (1) TABLET DAILY AS DIRECTED. 90 tablet 1   No current facility-administered medications on file prior to visit.     Past Medical History:  Diagnosis Date  . Atrial fibrillation (HCC)    paroxysmal, on coumadin  . Benign essential HTN   . Diabetes mellitus type II   . Hearing loss, conductive, bilateral   .  Hyperlipidemia   . 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 Mobitz (type) II atrioventricular block    S/P  PPM (SJM) by Dr Rayann Heman  . Neoplasm of uncertain behavior of skin   . PVC's (premature ventricular contractions)   . 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 Sleep disorder     Past Surgical History:  Procedure Laterality Date  . 3 fatty tumors     benign  . HAND SURGERY     Right thumb amputated  and reattached   . HERNIA REPAIR     Umbilical  . no colonoscopy     "did not want one" (warfarin goes against it; 11/09/13)  . PACEMAKER PLACEMENT     06/12/2008, Dr. Thompson Grayer  . SKIN CANCER EXCISION     above nose     Social History   Social History  . Marital status: Married    Spouse name: N/A  . Number of children: N/A  . Years of education: N/A   Social History Main Topics  . Smoking status: Former Smoker    Packs/day: 2.00    Years: 10.00    Types: Cigarettes    Quit date: 04/27/1985  . Smokeless tobacco: Never Used     Comment: smoked age [age], maximum regular consumption up to 1 ppd  . Alcohol use No  . Drug use: No  . Sexual activity: Not Asked   Other Topics Concern  . None   Social History Narrative  . None    Family History  Problem Relation Age of Onset  . Heart attack Paternal Grandfather 89  . Diabetes Paternal Uncle   . Diabetes Maternal Grandfather   . Skin cancer Father   . Emphysema Father   . Coronary artery disease Father        stent  . Diabetes Maternal Aunt   . Diabetes Maternal Uncle   . Stroke Neg Hx     Review of Systems  Constitutional: Negative for chills and fever.  Respiratory: Negative for cough, shortness of breath and wheezing.   Cardiovascular: Negative for chest pain, palpitations and leg swelling.  Neurological: Negative for light-headedness and headaches.       Objective:   Vitals:   01/22/17 1121  BP: 108/68  Pulse: 65  Resp: 16  Temp: 97.6 F (36.4 C)  SpO2: 98%   Wt Readings from Last 3 Encounters:  01/22/17 206 lb (93.4 kg)  10/09/16 205 lb (93 kg)  08/17/16 207 lb (93.9 kg)   Body mass index is 29.56 kg/m.   Physical Exam    Constitutional: Appears well-developed and well-nourished. No distress.  HENT:  Head: Normocephalic and atraumatic.  Neck: Neck supple. No tracheal deviation present. No thyromegaly present.  No cervical lymphadenopathy Cardiovascular: Normal rate, regular rhythm and  normal heart sounds.   No murmur heard. No carotid bruit .  No edema Pulmonary/Chest: Effort normal and breath sounds normal. No respiratory distress. No has no wheezes. No rales.  Skin: Skin is warm and dry. Not diaphoretic.  Psychiatric: Normal mood and affect. Behavior is normal.      Assessment & Plan:    See Problem List for Assessment and Plan of chronic medical problems.   Follow-up in 6 months with blood work prior

## 2017-01-21 NOTE — Patient Instructions (Addendum)

## 2017-01-22 ENCOUNTER — Encounter: Payer: Self-pay | Admitting: Internal Medicine

## 2017-01-22 ENCOUNTER — Other Ambulatory Visit (INDEPENDENT_AMBULATORY_CARE_PROVIDER_SITE_OTHER): Payer: PPO

## 2017-01-22 ENCOUNTER — Ambulatory Visit (INDEPENDENT_AMBULATORY_CARE_PROVIDER_SITE_OTHER): Payer: PPO | Admitting: Internal Medicine

## 2017-01-22 VITALS — BP 108/68 | HR 65 | Temp 97.6°F | Resp 16 | Wt 206.0 lb

## 2017-01-22 DIAGNOSIS — E1351 Other specified diabetes mellitus with diabetic peripheral angiopathy without gangrene: Secondary | ICD-10-CM

## 2017-01-22 DIAGNOSIS — E7800 Pure hypercholesterolemia, unspecified: Secondary | ICD-10-CM

## 2017-01-22 DIAGNOSIS — F329 Major depressive disorder, single episode, unspecified: Secondary | ICD-10-CM | POA: Diagnosis not present

## 2017-01-22 DIAGNOSIS — I4891 Unspecified atrial fibrillation: Secondary | ICD-10-CM

## 2017-01-22 DIAGNOSIS — I10 Essential (primary) hypertension: Secondary | ICD-10-CM

## 2017-01-22 DIAGNOSIS — IMO0002 Reserved for concepts with insufficient information to code with codable children: Secondary | ICD-10-CM

## 2017-01-22 DIAGNOSIS — E1365 Other specified diabetes mellitus with hyperglycemia: Secondary | ICD-10-CM

## 2017-01-22 DIAGNOSIS — G4733 Obstructive sleep apnea (adult) (pediatric): Secondary | ICD-10-CM | POA: Diagnosis not present

## 2017-01-22 DIAGNOSIS — F32A Depression, unspecified: Secondary | ICD-10-CM

## 2017-01-22 LAB — CBC WITH DIFFERENTIAL/PLATELET
BASOS ABS: 0 10*3/uL (ref 0.0–0.1)
Basophils Relative: 0.6 % (ref 0.0–3.0)
Eosinophils Absolute: 0.2 10*3/uL (ref 0.0–0.7)
Eosinophils Relative: 3.1 % (ref 0.0–5.0)
HCT: 46 % (ref 39.0–52.0)
HEMOGLOBIN: 15.4 g/dL (ref 13.0–17.0)
LYMPHS ABS: 1.9 10*3/uL (ref 0.7–4.0)
Lymphocytes Relative: 28 % (ref 12.0–46.0)
MCHC: 33.4 g/dL (ref 30.0–36.0)
MCV: 88.3 fl (ref 78.0–100.0)
MONO ABS: 0.7 10*3/uL (ref 0.1–1.0)
Monocytes Relative: 9.7 % (ref 3.0–12.0)
NEUTROS PCT: 58.6 % (ref 43.0–77.0)
Neutro Abs: 3.9 10*3/uL (ref 1.4–7.7)
Platelets: 200 10*3/uL (ref 150.0–400.0)
RBC: 5.21 Mil/uL (ref 4.22–5.81)
RDW: 13.7 % (ref 11.5–15.5)
WBC: 6.7 10*3/uL (ref 4.0–10.5)

## 2017-01-22 LAB — COMPREHENSIVE METABOLIC PANEL
ALBUMIN: 4.3 g/dL (ref 3.5–5.2)
ALK PHOS: 33 U/L — AB (ref 39–117)
ALT: 19 U/L (ref 0–53)
AST: 24 U/L (ref 0–37)
BILIRUBIN TOTAL: 1.2 mg/dL (ref 0.2–1.2)
BUN: 18 mg/dL (ref 6–23)
CO2: 28 mEq/L (ref 19–32)
Calcium: 9.9 mg/dL (ref 8.4–10.5)
Chloride: 97 mEq/L (ref 96–112)
Creatinine, Ser: 1.48 mg/dL (ref 0.40–1.50)
GFR: 49.39 mL/min — AB (ref >60.00)
GLUCOSE: 184 mg/dL — AB (ref 70–99)
Potassium: 4.3 mEq/L (ref 3.5–5.1)
SODIUM: 133 meq/L — AB (ref 135–145)
TOTAL PROTEIN: 6.7 g/dL (ref 6.0–8.3)

## 2017-01-22 LAB — HEMOGLOBIN A1C: HEMOGLOBIN A1C: 7.5 % — AB (ref 4.6–6.5)

## 2017-01-22 NOTE — Assessment & Plan Note (Signed)
BP Readings from Last 3 Encounters:  01/22/17 108/68  10/09/16 126/64  08/17/16 122/70   BP well controlled Current regimen effective and well tolerated Continue current medications at current doses

## 2017-01-22 NOTE — Assessment & Plan Note (Signed)
Currently not using CPAP-start using it when he had his recent upper respiratory infection He knows he needs to get back to using-stressed the importance of using it on a nightly basis.

## 2017-01-22 NOTE — Assessment & Plan Note (Signed)
Controlled, stable Continue current dose of medication  

## 2017-01-22 NOTE — Assessment & Plan Note (Signed)
Rate controlled, asymptomatic Taking Coumadin Following with cardiology Check CBC

## 2017-01-22 NOTE — Assessment & Plan Note (Signed)
Trying to eat better Check a1c Low sugar / carb diet Stressed regular exercise,weight loss

## 2017-01-22 NOTE — Assessment & Plan Note (Signed)
Continue statin Will check lipid panel at his next visit

## 2017-01-25 ENCOUNTER — Other Ambulatory Visit: Payer: Self-pay | Admitting: Internal Medicine

## 2017-01-25 MED ORDER — EMPAGLIFLOZIN 10 MG PO TABS: 10.0000 mg | ORAL_TABLET | Freq: Every day | ORAL | 5 refills | Status: DC

## 2017-02-08 DIAGNOSIS — E119 Type 2 diabetes mellitus without complications: Secondary | ICD-10-CM | POA: Diagnosis not present

## 2017-02-26 ENCOUNTER — Ambulatory Visit (INDEPENDENT_AMBULATORY_CARE_PROVIDER_SITE_OTHER): Payer: PPO | Admitting: General Practice

## 2017-02-26 DIAGNOSIS — Z7901 Long term (current) use of anticoagulants: Secondary | ICD-10-CM | POA: Diagnosis not present

## 2017-02-26 LAB — POCT INR: INR: 2.1

## 2017-02-26 NOTE — Patient Instructions (Signed)
Pre visit review using our clinic review tool, if applicable. No additional management support is needed unless otherwise documented below in the visit note. 

## 2017-02-28 NOTE — Progress Notes (Signed)
Agree with management.  Stacy J Burns, MD  

## 2017-03-15 ENCOUNTER — Other Ambulatory Visit: Payer: Self-pay | Admitting: Internal Medicine

## 2017-03-24 ENCOUNTER — Encounter: Payer: Self-pay | Admitting: Internal Medicine

## 2017-03-31 ENCOUNTER — Ambulatory Visit: Payer: PPO | Admitting: Internal Medicine

## 2017-03-31 ENCOUNTER — Encounter: Payer: Self-pay | Admitting: Internal Medicine

## 2017-03-31 VITALS — BP 124/72 | HR 65 | Ht 70.0 in | Wt 204.0 lb

## 2017-03-31 DIAGNOSIS — I442 Atrioventricular block, complete: Secondary | ICD-10-CM | POA: Diagnosis not present

## 2017-03-31 DIAGNOSIS — I10 Essential (primary) hypertension: Secondary | ICD-10-CM | POA: Diagnosis not present

## 2017-03-31 DIAGNOSIS — I4891 Unspecified atrial fibrillation: Secondary | ICD-10-CM

## 2017-03-31 DIAGNOSIS — I482 Chronic atrial fibrillation: Secondary | ICD-10-CM | POA: Diagnosis not present

## 2017-03-31 DIAGNOSIS — Z950 Presence of cardiac pacemaker: Secondary | ICD-10-CM

## 2017-03-31 DIAGNOSIS — I4821 Permanent atrial fibrillation: Secondary | ICD-10-CM

## 2017-03-31 LAB — CUP PACEART INCLINIC DEVICE CHECK
Battery Impedance: 2500 Ohm
Battery Voltage: 2.78 V
Date Time Interrogation Session: 20181205140253
Implantable Lead Implant Date: 20100216
Implantable Lead Implant Date: 20100216
Implantable Lead Location: 753860
Lead Channel Pacing Threshold Amplitude: 1 V
Lead Channel Pacing Threshold Pulse Width: 0.4 ms
MDC IDC LEAD LOCATION: 753859
MDC IDC MSMT LEADCHNL RV IMPEDANCE VALUE: 357 Ohm
MDC IDC PG IMPLANT DT: 20100216
MDC IDC PG SERIAL: 1285538
MDC IDC SET LEADCHNL RV PACING AMPLITUDE: 2.5 V
MDC IDC SET LEADCHNL RV PACING PULSEWIDTH: 0.4 ms
MDC IDC SET LEADCHNL RV SENSING SENSITIVITY: 2 mV

## 2017-03-31 NOTE — Patient Instructions (Addendum)
Medication Instructions:  Your physician recommends that you continue on your current medications as directed. Please refer to the Current Medication list given to you today.   Labwork: None ordered   Testing/Procedures: None ordered   Follow-Up: Your physician wants you to follow-up in: 12 months with Renee Ursuy, PA You will receive a reminder letter in the mail two months in advance. If you don't receive a letter, please call our office to schedule the follow-up appointment.       Any Other Special Instructions Will Be Listed Below (If Applicable).     If you need a refill on your cardiac medications before your next appointment, please call your pharmacy.   

## 2017-03-31 NOTE — Progress Notes (Signed)
PCP: Binnie Rail, MD   Primary EP:  Dr Dierdre Forth is a 76 y.o. male who presents today for routine electrophysiology followup.  Since last being seen in our clinic, the patient reports doing very well.  Today, he denies symptoms of palpitations, chest pain, shortness of breath,  lower extremity edema, dizziness, presyncope, or syncope.  The patient is otherwise without complaint today.   Past Medical History:  Diagnosis Date  . Atrial fibrillation (HCC)    paroxysmal, on coumadin  . Benign essential HTN   . Diabetes mellitus type II   . Hearing loss, conductive, bilateral   . Hyperlipidemia   . 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 Mobitz (type) II atrioventricular block    S/P  PPM (SJM) by Dr Rayann Heman  . Neoplasm of uncertain behavior of skin   . PVC's (premature ventricular contractions)   . 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 Sleep disorder    Past Surgical History:  Procedure Laterality Date  . 3 fatty tumors     benign  . HAND SURGERY     Right thumb amputated and reattached   . HERNIA REPAIR     Umbilical  . no colonoscopy     "did not want one" (warfarin goes against it; 11/09/13)  . PACEMAKER PLACEMENT     06/12/2008, Dr. Thompson Grayer  . SKIN CANCER EXCISION     above nose     ROS- all systems are reviewed and negative except as per HPI above  Current Outpatient Medications  Medication Sig Dispense Refill  . acetaminophen (TYLENOL ARTHRITIS PAIN) 650 MG CR tablet Take 650 mg by mouth at bedtime. arthritis    . cetirizine (ZYRTEC) 10 MG tablet Take 10 mg by mouth daily.     . empagliflozin (JARDIANCE) 10 MG TABS tablet Take 10 mg by mouth daily. 30 tablet 5  . fenofibrate micronized (LOFIBRA) 134 MG capsule TAKE 1 CAPSULE BEFORE BREAKFAST. 90 capsule 3  . FLUoxetine (PROZAC) 10 MG capsule TAKE (1) CAPSULE DAILY. 90 capsule 1  . glimepiride (AMARYL) 2 MG tablet TAKE 1 TABLET EVERY  DAY WITH BREAKFAST. 90 tablet 1  . hydrochlorothiazide (MICROZIDE) 12.5 MG capsule TAKE (1) CAPSULE DAILY. 90 capsule 3  . losartan (COZAAR) 100 MG tablet TAKE 1 TABLET ONCE DAILY. 90 tablet 3  . metFORMIN (GLUCOPHAGE) 500 MG tablet TAKE 2 TABLETS TWICE DAILY WITH MEALS. 360 tablet 1  . Multiple Vitamin (MULTIVITAMIN) tablet Take 1 tablet by mouth daily.      . ONE TOUCH ULTRA TEST test strip CHECK BLOOD SUGAR ONCE A DAY. 100 each 2  . pravastatin (PRAVACHOL) 20 MG tablet TAKE 1 TABLET ONCE DAILY. 90 tablet 3  . warfarin (COUMADIN) 2 MG tablet TAKE (1) TABLET DAILY AS DIRECTED. 90 tablet 1   No current facility-administered medications for this visit.     Physical Exam: Vitals:   03/31/17 1216  BP: 124/72  Pulse: 65  Weight: 204 lb (92.5 kg)  Height: 5\' 10"  (1.778 m)    GEN- The patient is well appearing, alert and oriented x 3 today.   Head- normocephalic, atraumatic Eyes-  Sclera clear, conjunctiva pink Ears- hearing intact Oropharynx- clear Lungs- Clear to ausculation bilaterally, normal work of breathing Chest- pacemaker pocket is well healed Heart- Regular rate and rhythm, (Paced) GI- soft, NT, ND, + BS Extremities- no clubbing, cyanosis,  or edema  Pacemaker interrogation- reviewed in detail today,  See PACEART report  ekg tracing ordered today is personally reviewed and shows afib, V paced  Assessment and Plan:  1. Symptomatic complete heart block Normal pacemaker function See Pace Art report No changes today  2. Permanent afib Rate controlled  3. Moderate MR No prominent murmur today Echo 2017 reviewed Repeat echo in a year  4. HTN Stable No change required today  Return to see EP PA in a year  Thompson Grayer MD, [facility] 03/31/2017 12:39 PM

## 2017-04-09 ENCOUNTER — Ambulatory Visit (INDEPENDENT_AMBULATORY_CARE_PROVIDER_SITE_OTHER): Payer: PPO | Admitting: General Practice

## 2017-04-09 DIAGNOSIS — Z7901 Long term (current) use of anticoagulants: Secondary | ICD-10-CM

## 2017-04-09 DIAGNOSIS — I4891 Unspecified atrial fibrillation: Secondary | ICD-10-CM

## 2017-04-09 LAB — POCT INR: INR: 2.4

## 2017-04-09 NOTE — Progress Notes (Signed)
Agree.  Stacy J Burns, MD  

## 2017-04-09 NOTE — Patient Instructions (Addendum)
Pre visit review using our clinic review tool, if applicable. No additional management support is needed unless otherwise documented below in the visit note.  Take 1/2 tablet all days except 1 tablet on Mondays.  Re-check in 6 weeks.

## 2017-05-21 ENCOUNTER — Ambulatory Visit (INDEPENDENT_AMBULATORY_CARE_PROVIDER_SITE_OTHER): Payer: PPO | Admitting: General Practice

## 2017-05-21 DIAGNOSIS — Z7901 Long term (current) use of anticoagulants: Secondary | ICD-10-CM

## 2017-05-21 DIAGNOSIS — I4891 Unspecified atrial fibrillation: Secondary | ICD-10-CM

## 2017-05-21 LAB — POCT INR: INR: 1.9

## 2017-05-21 NOTE — Patient Instructions (Addendum)
Pre visit review using our clinic review tool, if applicable. No additional management support is needed unless otherwise documented below in the visit note.  Take 1 tablet today and tomorrow (1/25 and 1/26) and then continue to take 1/2 tablet all days except 1 tablet on Mondays.  Re-check in 4 weeks.

## 2017-05-22 NOTE — Progress Notes (Signed)
Agree with management.  Albert Vavra J Khi Mcmillen, MD  

## 2017-05-26 ENCOUNTER — Telehealth: Payer: Self-pay | Admitting: Internal Medicine

## 2017-05-26 NOTE — Telephone Encounter (Signed)
Spoke with Albert Hawkins regarding AWV. Pt declined to schedule appointment at this time. SF

## 2017-06-11 ENCOUNTER — Other Ambulatory Visit: Payer: Self-pay | Admitting: Internal Medicine

## 2017-06-18 ENCOUNTER — Ambulatory Visit (INDEPENDENT_AMBULATORY_CARE_PROVIDER_SITE_OTHER): Payer: PPO | Admitting: General Practice

## 2017-06-18 DIAGNOSIS — Z7901 Long term (current) use of anticoagulants: Secondary | ICD-10-CM | POA: Diagnosis not present

## 2017-06-18 DIAGNOSIS — I4891 Unspecified atrial fibrillation: Secondary | ICD-10-CM

## 2017-06-18 LAB — POCT INR: INR: 1.9

## 2017-06-18 NOTE — Patient Instructions (Addendum)
Pre visit review using our clinic review tool, if applicable. No additional management support is needed unless otherwise documented below in the visit note.  Change dosage and take 1/2 tablet all days except 1 tablet on Mondays and Fridays.  Re-check in 4 weeks.

## 2017-06-18 NOTE — Progress Notes (Signed)
Agree with management.  Michiel Sivley J Ainslie Mazurek, MD  

## 2017-07-05 ENCOUNTER — Other Ambulatory Visit: Payer: Self-pay | Admitting: Internal Medicine

## 2017-07-16 ENCOUNTER — Ambulatory Visit (INDEPENDENT_AMBULATORY_CARE_PROVIDER_SITE_OTHER): Payer: PPO | Admitting: General Practice

## 2017-07-16 DIAGNOSIS — I4891 Unspecified atrial fibrillation: Secondary | ICD-10-CM

## 2017-07-16 DIAGNOSIS — Z7901 Long term (current) use of anticoagulants: Secondary | ICD-10-CM | POA: Diagnosis not present

## 2017-07-16 LAB — POCT INR: INR: 2.1

## 2017-07-16 NOTE — Patient Instructions (Addendum)
Pre visit review using our clinic review tool, if applicable. No additional management support is needed unless otherwise documented below in the visit note.  Change dosage and take 1/2 tablet all days except 1 tablet on Mondays/Wednesdays and Fridays.  Re-check in 4 weeks.

## 2017-07-16 NOTE — Progress Notes (Signed)
Agree with management.  Stacy J Burns, MD  

## 2017-07-23 DIAGNOSIS — N1831 Chronic kidney disease, stage 3a: Secondary | ICD-10-CM | POA: Insufficient documentation

## 2017-07-23 DIAGNOSIS — N183 Chronic kidney disease, stage 3 unspecified: Secondary | ICD-10-CM | POA: Insufficient documentation

## 2017-07-23 NOTE — Progress Notes (Signed)
Subjective:    Patient ID: Albert Hawkins, male    DOB: Oct 05, 1940, 77 y.o.   MRN: [id number]  HPI The patient is here for follow up.  Diabetes: He is taking his medication daily as prescribed. He is compliant with a diabetic diet. He is not exercising regularly.  He checks his feet daily and denies foot lesions. He is up-to-date with an ophthalmology examination.   Hypertension: He is taking his medication daily. He is compliant with a low sodium diet.  He denies chest pain, palpitations, edema, shortness of breath and regular headaches. He is not exercising regularly.  He does not monitor his blood pressure at home.    Hyperlipidemia: He is taking his medication daily. He is compliant with a low fat/cholesterol diet. He is not exercising regularly. He denies myalgias.   Depression: He is taking his medication daily as prescribed. He denies any side effects from the medication. He feels his depression is well controlled and he is happy with his current dose of medication.   CKD: he does not take any nsaids.  He drinks a fair amount of water during the day.  OSA:  He is not using his cpap.  He knows he needs to get back to using it.    Medications and allergies reviewed with patient and updated if appropriate.  Patient Active Problem List   Diagnosis Date Noted  . CKD (chronic kidney disease) stage 3, GFR 30-59 ml/min (HCC) 07/23/2017  . Long term (current) use of anticoagulants 01/15/2017  . Obesity (BMI 30-39.9) 11/09/2013  . Encounter for therapeutic drug monitoring 06/06/2013  . Complete heart block ([place]) 03/03/2013  . OSA (obstructive sleep apnea) 03/28/2012  . 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 Periodic limb movement disorder (PLMD) 12/25/2011  . Depression 01/13/2010  . LUMBAR RADICULOPATHY 07/20/2008  . Second degree AV block, Mobitz type II 07/18/2008  . Essential hypertension 01/16/2008  . 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 Atrial fibrillation ([place])  11/17/2007  . HEARING LOSS, CONDUCTIVE, BILATERAL 04/14/2007  . DM (diabetes mellitus), secondary, uncontrolled, with peripheral vascular complications ([place]) 85/05/7739  . Hyperlipidemia 03/17/2007  . 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 SKIN CANCER, HX OF 05/17/2006    Current Outpatient Medications on File Prior to Visit  Medication Sig Dispense Refill  . acetaminophen (TYLENOL ARTHRITIS PAIN) 650 MG CR tablet Take 650 mg by mouth at bedtime. arthritis    . cetirizine (ZYRTEC) 10 MG tablet Take 10 mg by mouth daily.     . fenofibrate micronized (LOFIBRA) 134 MG capsule TAKE 1 CAPSULE BEFORE BREAKFAST. 90 capsule 3  . FLUoxetine (PROZAC) 10 MG capsule TAKE (1) CAPSULE DAILY. 90 capsule 1  . glimepiride (AMARYL) 2 MG tablet TAKE 1 TABLET EVERY DAY WITH BREAKFAST. 90 tablet 1  . hydrochlorothiazide (MICROZIDE) 12.5 MG capsule TAKE (1) CAPSULE DAILY. 90 capsule 3  . JARDIANCE 10 MG TABS tablet TAKE 1 TABLET ONCE DAILY. 90 tablet 0  . losartan (COZAAR) 100 MG tablet TAKE 1 TABLET ONCE DAILY. 90 tablet 3  . metFORMIN (GLUCOPHAGE) 500 MG tablet TAKE 2 TABLETS TWICE DAILY WITH MEALS. 360 tablet 1  . Multiple Vitamin (MULTIVITAMIN) tablet Take 1 tablet by mouth daily.      . ONE TOUCH ULTRA TEST test strip CHECK BLOOD SUGAR ONCE A DAY. 100 each 3  . pravastatin (PRAVACHOL) 20 MG tablet TAKE 1 TABLET ONCE DAILY. 90 tablet 3  . warfarin (COUMADIN) 2 MG tablet TAKE (1) TABLET  DAILY AS DIRECTED. 90 tablet 0   No current facility-administered medications on file prior to visit.     Past Medical History:  Diagnosis Date  . Atrial fibrillation (HCC)    paroxysmal, on coumadin  . Benign essential HTN   . Diabetes mellitus type II   . Hearing loss, conductive, bilateral   . Hyperlipidemia   . 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 Mobitz (type) II atrioventricular block    S/P  PPM (SJM) by Dr Rayann Heman  . Neoplasm of uncertain behavior of  skin   . PVC's (premature ventricular contractions)   . 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 Sleep disorder     Past Surgical History:  Procedure Laterality Date  . 3 fatty tumors     benign  . HAND SURGERY     Right thumb amputated and reattached   . HERNIA REPAIR     Umbilical  . no colonoscopy     "did not want one" (warfarin goes against it; 11/09/13)  . PACEMAKER PLACEMENT     06/12/2008, Dr. Thompson Grayer  . SKIN CANCER EXCISION     above nose     Social History   Socioeconomic History  . Marital status: Married    Spouse name: Not on file  . Number of children: Not on file  . Years of education: Not on file  . Highest education level: Not on file  Occupational History  . Not on file  Social Needs  . Financial resource strain: Not on file  . Food insecurity:    Worry: Not on file    Inability: Not on file  . Transportation needs:    Medical: Not on file    Non-medical: Not on file  Tobacco Use  . Smoking status: Former Smoker    Packs/day: 2.00    Years: 10.00    Pack years: 20.00    Types: Cigarettes    Last attempt to quit: 04/27/1985    Years since quitting: 32.2  . Smokeless tobacco: Never Used  . Tobacco comment: smoked age [age], maximum regular consumption up to 1 ppd  Substance and Sexual Activity  . Alcohol use: No  . Drug use: No  . Sexual activity: Not on file  Lifestyle  . Physical activity:    Days per week: Not on file    Minutes per session: Not on file  . Stress: Not on file  Relationships  . Social connections:    Talks on phone: Not on file    Gets together: Not on file    Attends religious service: Not on file    Active member of club or organization: Not on file    Attends meetings of clubs or organizations: Not on file    Relationship status: Not on file  Other Topics Concern  . Not on file  Social History Narrative  . Not on file    Family History  Problem Relation Age of Onset  . Heart  attack Paternal Grandfather 89  . Diabetes Paternal Uncle   . Diabetes Maternal Grandfather   . Skin cancer Father   . Emphysema Father   . Coronary artery disease Father        stent  . Diabetes Maternal Aunt   . Diabetes Maternal Uncle   . Stroke Neg Hx     Review of Systems  Constitutional: Negative for chills and  fever.  Respiratory: Negative for cough, shortness of breath and wheezing.   Cardiovascular: Negative for chest pain, palpitations and leg swelling.  Neurological: Negative for light-headedness and headaches.       Objective:   Vitals:   07/26/17 1111  BP: (!) 144/74  Pulse: (!) 58  Resp: 16  Temp: (!) 97.5 F (36.4 C)  SpO2: 98%   BP Readings from Last 3 Encounters:  07/26/17 (!) 144/74  03/31/17 124/72  01/22/17 108/68   Wt Readings from Last 3 Encounters:  07/26/17 204 lb (92.5 kg)  03/31/17 204 lb (92.5 kg)  01/22/17 206 lb (93.4 kg)   Body mass index is 29.27 kg/m.   Physical Exam    Constitutional: Appears well-developed and well-nourished. No distress.  HENT:  Head: Normocephalic and atraumatic.  Neck: Neck supple. No tracheal deviation present. No thyromegaly present.  No cervical lymphadenopathy Cardiovascular: Normal rate, regular rhythm and normal heart sounds.   No murmur heard. No carotid bruit .  No edema Pulmonary/Chest: Effort normal and breath sounds normal. No respiratory distress. No has no wheezes. No rales.  Skin: Skin is warm and dry. Not diaphoretic.  Psychiatric: Normal mood and affect. Behavior is normal.      Assessment & Plan:    See Problem List for Assessment and Plan of chronic medical problems.

## 2017-07-23 NOTE — Patient Instructions (Addendum)
  Test(s) ordered today. Your results will be released to MyChart (or called to you) after review, usually within 72hours after test completion. If any changes need to be made, you will be notified at that same time.  Medications reviewed and updated.  No changes recommended at this time.    Please followup in 6 months   

## 2017-07-26 ENCOUNTER — Ambulatory Visit (INDEPENDENT_AMBULATORY_CARE_PROVIDER_SITE_OTHER): Payer: PPO | Admitting: Internal Medicine

## 2017-07-26 ENCOUNTER — Other Ambulatory Visit (INDEPENDENT_AMBULATORY_CARE_PROVIDER_SITE_OTHER): Payer: PPO

## 2017-07-26 ENCOUNTER — Encounter: Payer: Self-pay | Admitting: Internal Medicine

## 2017-07-26 VITALS — BP 144/74 | HR 58 | Temp 97.5°F | Resp 16 | Wt 204.0 lb

## 2017-07-26 DIAGNOSIS — F32A Depression, unspecified: Secondary | ICD-10-CM

## 2017-07-26 DIAGNOSIS — E1351 Other specified diabetes mellitus with diabetic peripheral angiopathy without gangrene: Secondary | ICD-10-CM

## 2017-07-26 DIAGNOSIS — N183 Chronic kidney disease, stage 3 unspecified: Secondary | ICD-10-CM

## 2017-07-26 DIAGNOSIS — IMO0002 Reserved for concepts with insufficient information to code with codable children: Secondary | ICD-10-CM

## 2017-07-26 DIAGNOSIS — E7849 Other hyperlipidemia: Secondary | ICD-10-CM

## 2017-07-26 DIAGNOSIS — G4733 Obstructive sleep apnea (adult) (pediatric): Secondary | ICD-10-CM | POA: Diagnosis not present

## 2017-07-26 DIAGNOSIS — I10 Essential (primary) hypertension: Secondary | ICD-10-CM

## 2017-07-26 DIAGNOSIS — E1365 Other specified diabetes mellitus with hyperglycemia: Secondary | ICD-10-CM | POA: Diagnosis not present

## 2017-07-26 DIAGNOSIS — F329 Major depressive disorder, single episode, unspecified: Secondary | ICD-10-CM | POA: Diagnosis not present

## 2017-07-26 LAB — COMPREHENSIVE METABOLIC PANEL
ALT: 22 U/L (ref 0–53)
AST: 24 U/L (ref 0–37)
Albumin: 4.1 g/dL (ref 3.5–5.2)
Alkaline Phosphatase: 33 U/L — ABNORMAL LOW (ref 39–117)
BILIRUBIN TOTAL: 0.8 mg/dL (ref 0.2–1.2)
BUN: 21 mg/dL (ref 6–23)
CO2: 28 meq/L (ref 19–32)
Calcium: 9.4 mg/dL (ref 8.4–10.5)
Chloride: 97 mEq/L (ref 96–112)
Creatinine, Ser: 1.45 mg/dL (ref 0.40–1.50)
GFR: 50.51 mL/min — AB (ref >60.00)
Glucose, Bld: 156 mg/dL — ABNORMAL HIGH (ref 70–99)
Potassium: 3.9 mEq/L (ref 3.5–5.1)
SODIUM: 135 meq/L (ref 135–145)
Total Protein: 6.8 g/dL (ref 6.0–8.3)

## 2017-07-26 LAB — HEMOGLOBIN A1C: HEMOGLOBIN A1C: 7.7 % — AB (ref 4.6–6.5)

## 2017-07-26 LAB — CBC WITH DIFFERENTIAL/PLATELET
BASOS ABS: 0.1 10*3/uL (ref 0.0–0.1)
Basophils Relative: 1.2 % (ref 0.0–3.0)
EOS ABS: 0.2 10*3/uL (ref 0.0–0.7)
Eosinophils Relative: 2.8 % (ref 0.0–5.0)
HCT: 47 % (ref 39.0–52.0)
Hemoglobin: 16 g/dL (ref 13.0–17.0)
LYMPHS ABS: 2 10*3/uL (ref 0.7–4.0)
Lymphocytes Relative: 33.3 % (ref 12.0–46.0)
MCHC: 34.1 g/dL (ref 30.0–36.0)
MCV: 87.6 fl (ref 78.0–100.0)
MONO ABS: 0.6 10*3/uL (ref 0.1–1.0)
MONOS PCT: 9.4 % (ref 3.0–12.0)
NEUTROS ABS: 3.3 10*3/uL (ref 1.4–7.7)
NEUTROS PCT: 53.3 % (ref 43.0–77.0)
PLATELETS: 182 10*3/uL (ref 150.0–400.0)
RBC: 5.36 Mil/uL (ref 4.22–5.81)
RDW: 13.9 % (ref 11.5–15.5)
WBC: 6.1 10*3/uL (ref 4.0–10.5)

## 2017-07-26 LAB — LIPID PANEL
CHOL/HDL RATIO: 4
Cholesterol: 154 mg/dL (ref 0–200)
HDL: 43.2 mg/dL (ref >39.00)
LDL Cholesterol: 71 mg/dL (ref 0–99)
NONHDL: 110.57
Triglycerides: 197 mg/dL — ABNORMAL HIGH (ref 0.0–149.0)
VLDL: 39.4 mg/dL (ref 0.0–40.0)

## 2017-07-26 LAB — TSH: TSH: 2.06 u[IU]/mL (ref 0.35–4.50)

## 2017-07-26 MED ORDER — GLUCOSE BLOOD VI STRP: ORAL_STRIP | 3 refills | Status: DC

## 2017-07-26 NOTE — Assessment & Plan Note (Signed)
Check A1c He is not always compliant with a diabetic diet and currently is not exercising regularly Sugars have not been controlled-we will increase testing of glucose at home to twice daily Stressed getting back to regular exercise Ideally work on weight loss Eye exams up-to-date-we will get report

## 2017-07-26 NOTE — Assessment & Plan Note (Signed)
Not currently using cpap Stressed getting back on cpap

## 2017-07-26 NOTE — Assessment & Plan Note (Signed)
Check lipid panel  Continue daily statin Regular exercise and healthy diet encouraged  

## 2017-07-26 NOTE — Assessment & Plan Note (Signed)
Controlled, stable Continue current dose of medication  

## 2017-07-26 NOTE — Assessment & Plan Note (Signed)
Cbc, cmp 

## 2017-07-26 NOTE — Assessment & Plan Note (Signed)
BP Readings from Last 3 Encounters:  07/26/17 (!) 144/74  03/31/17 124/72  01/22/17 108/68    BP well controlled Current regimen effective and well tolerated Continue current medications at current doses cmp

## 2017-07-27 ENCOUNTER — Other Ambulatory Visit: Payer: Self-pay | Admitting: Emergency Medicine

## 2017-07-27 MED ORDER — EMPAGLIFLOZIN 25 MG PO TABS: 25.0000 mg | ORAL_TABLET | Freq: Every day | ORAL | 0 refills | Status: DC

## 2017-07-28 ENCOUNTER — Encounter: Payer: Self-pay | Admitting: Internal Medicine

## 2017-07-28 DIAGNOSIS — Z1211 Encounter for screening for malignant neoplasm of colon: Secondary | ICD-10-CM

## 2017-08-18 LAB — COLOGUARD: COLOGUARD: NEGATIVE

## 2017-08-20 ENCOUNTER — Ambulatory Visit (INDEPENDENT_AMBULATORY_CARE_PROVIDER_SITE_OTHER): Payer: PPO | Admitting: General Practice

## 2017-08-20 DIAGNOSIS — Z7901 Long term (current) use of anticoagulants: Secondary | ICD-10-CM

## 2017-08-20 DIAGNOSIS — I4891 Unspecified atrial fibrillation: Secondary | ICD-10-CM | POA: Diagnosis not present

## 2017-08-20 LAB — POCT INR: INR: 2.7

## 2017-08-20 NOTE — Patient Instructions (Addendum)
Pre visit review using our clinic review tool, if applicable. No additional management support is needed unless otherwise documented below in the visit note.  Continue to take 1/2 tablet all days except 1 tablet on Mondays/Wednesdays and Fridays.  Re-check in 6 weeks.  

## 2017-08-31 ENCOUNTER — Telehealth: Payer: Self-pay | Admitting: Internal Medicine

## 2017-08-31 NOTE — Telephone Encounter (Signed)
Let him know his Cologuard test is negative.  Recommend repeat in 3 years.  Entered into health maintenance

## 2017-09-01 ENCOUNTER — Encounter: Payer: Self-pay | Admitting: Internal Medicine

## 2017-09-01 NOTE — Telephone Encounter (Signed)
Spoke with pts wife to inform her of pt's results.

## 2017-09-08 ENCOUNTER — Other Ambulatory Visit: Payer: Self-pay | Admitting: General Practice

## 2017-09-08 ENCOUNTER — Other Ambulatory Visit: Payer: Self-pay | Admitting: Internal Medicine

## 2017-09-08 MED ORDER — WARFARIN SODIUM 2 MG PO TABS: ORAL_TABLET | ORAL | 1 refills | Status: DC

## 2017-09-16 DIAGNOSIS — L570 Actinic keratosis: Secondary | ICD-10-CM | POA: Diagnosis not present

## 2017-09-16 DIAGNOSIS — Z85828 Personal history of other malignant neoplasm of skin: Secondary | ICD-10-CM | POA: Diagnosis not present

## 2017-09-16 DIAGNOSIS — L309 Dermatitis, unspecified: Secondary | ICD-10-CM | POA: Diagnosis not present

## 2017-09-16 DIAGNOSIS — L814 Other melanin hyperpigmentation: Secondary | ICD-10-CM | POA: Diagnosis not present

## 2017-09-16 DIAGNOSIS — L821 Other seborrheic keratosis: Secondary | ICD-10-CM | POA: Diagnosis not present

## 2017-09-16 DIAGNOSIS — D225 Melanocytic nevi of trunk: Secondary | ICD-10-CM | POA: Diagnosis not present

## 2017-09-16 DIAGNOSIS — D1801 Hemangioma of skin and subcutaneous tissue: Secondary | ICD-10-CM | POA: Diagnosis not present

## 2017-10-01 ENCOUNTER — Ambulatory Visit (INDEPENDENT_AMBULATORY_CARE_PROVIDER_SITE_OTHER): Payer: PPO | Admitting: General Practice

## 2017-10-01 DIAGNOSIS — I4891 Unspecified atrial fibrillation: Secondary | ICD-10-CM

## 2017-10-01 DIAGNOSIS — Z7901 Long term (current) use of anticoagulants: Secondary | ICD-10-CM

## 2017-10-01 LAB — POCT INR: INR: 2.6 (ref 2.0–3.0)

## 2017-10-01 NOTE — Progress Notes (Signed)
Agree with management.  Stacy J Burns, MD  

## 2017-10-01 NOTE — Patient Instructions (Addendum)
Pre visit review using our clinic review tool, if applicable. No additional management support is needed unless otherwise documented below in the visit note.  Continue to take 1/2 tablet all days except 1 tablet on Mondays/Wednesdays and Fridays.  Re-check in 6 weeks.  

## 2017-10-18 DIAGNOSIS — L253 Unspecified contact dermatitis due to other chemical products: Secondary | ICD-10-CM | POA: Diagnosis not present

## 2017-10-18 DIAGNOSIS — L2084 Intrinsic (allergic) eczema: Secondary | ICD-10-CM | POA: Diagnosis not present

## 2017-11-12 ENCOUNTER — Ambulatory Visit (INDEPENDENT_AMBULATORY_CARE_PROVIDER_SITE_OTHER): Payer: PPO | Admitting: General Practice

## 2017-11-12 DIAGNOSIS — Z7901 Long term (current) use of anticoagulants: Secondary | ICD-10-CM | POA: Diagnosis not present

## 2017-11-12 DIAGNOSIS — I4891 Unspecified atrial fibrillation: Secondary | ICD-10-CM

## 2017-11-12 LAB — POCT INR: INR: 2.6 (ref 2.0–3.0)

## 2017-11-12 NOTE — Patient Instructions (Addendum)
Pre visit review using our clinic review tool, if applicable. No additional management support is needed unless otherwise documented below in the visit note.  Continue to take 1/2 tablet all days except 1 tablet on Mondays/Wednesdays and Fridays.  Re-check in 6 weeks.

## 2017-12-16 ENCOUNTER — Other Ambulatory Visit (INDEPENDENT_AMBULATORY_CARE_PROVIDER_SITE_OTHER): Payer: PPO

## 2017-12-16 ENCOUNTER — Other Ambulatory Visit: Payer: Self-pay | Admitting: Internal Medicine

## 2017-12-16 ENCOUNTER — Ambulatory Visit (INDEPENDENT_AMBULATORY_CARE_PROVIDER_SITE_OTHER)
Admission: RE | Admit: 2017-12-16 | Discharge: 2017-12-16 | Disposition: A | Discharge status: Home / Self Care | Payer: PPO | Source: Ambulatory Visit | Attending: Internal Medicine | Admitting: Internal Medicine

## 2017-12-16 ENCOUNTER — Encounter: Payer: Self-pay | Admitting: Internal Medicine

## 2017-12-16 ENCOUNTER — Ambulatory Visit (INDEPENDENT_AMBULATORY_CARE_PROVIDER_SITE_OTHER): Payer: PPO | Admitting: Internal Medicine

## 2017-12-16 VITALS — BP 118/62 | HR 71 | Temp 99.1°F | Resp 16 | Ht 70.0 in | Wt 201.4 lb

## 2017-12-16 DIAGNOSIS — E1365 Other specified diabetes mellitus with hyperglycemia: Secondary | ICD-10-CM

## 2017-12-16 DIAGNOSIS — R05 Cough: Secondary | ICD-10-CM

## 2017-12-16 DIAGNOSIS — R509 Fever, unspecified: Secondary | ICD-10-CM

## 2017-12-16 DIAGNOSIS — R079 Chest pain, unspecified: Secondary | ICD-10-CM | POA: Diagnosis not present

## 2017-12-16 DIAGNOSIS — E1351 Other specified diabetes mellitus with diabetic peripheral angiopathy without gangrene: Secondary | ICD-10-CM | POA: Diagnosis not present

## 2017-12-16 DIAGNOSIS — R350 Frequency of micturition: Secondary | ICD-10-CM | POA: Diagnosis not present

## 2017-12-16 DIAGNOSIS — R058 Other specified cough: Secondary | ICD-10-CM

## 2017-12-16 DIAGNOSIS — IMO0002 Reserved for concepts with insufficient information to code with codable children: Secondary | ICD-10-CM

## 2017-12-16 LAB — URINALYSIS, ROUTINE W REFLEX MICROSCOPIC
Bilirubin Urine: NEGATIVE
Ketones, ur: NEGATIVE
Leukocytes, UA: NEGATIVE
Nitrite: NEGATIVE
PH: 6 (ref 5.0–8.0)
RBC / HPF: NONE SEEN (ref 0–?)
SPECIFIC GRAVITY, URINE: 1.01 (ref 1.000–1.030)
Urobilinogen, UA: 0.2 (ref 0.0–1.0)

## 2017-12-16 LAB — CBC WITH DIFFERENTIAL/PLATELET
Basophils Absolute: 0.1 10*3/uL (ref 0.0–0.1)
Basophils Relative: 0.9 % (ref 0.0–3.0)
EOS PCT: 0.5 % (ref 0.0–5.0)
Eosinophils Absolute: 0.1 10*3/uL (ref 0.0–0.7)
HCT: 41.4 % (ref 39.0–52.0)
Hemoglobin: 14 g/dL (ref 13.0–17.0)
LYMPHS ABS: 1.4 10*3/uL (ref 0.7–4.0)
Lymphocytes Relative: 9.6 % — ABNORMAL LOW (ref 12.0–46.0)
MCHC: 33.8 g/dL (ref 30.0–36.0)
MCV: 86.2 fl (ref 78.0–100.0)
MONO ABS: 1.5 10*3/uL — AB (ref 0.1–1.0)
Monocytes Relative: 10.3 % (ref 3.0–12.0)
NEUTROS ABS: 11.6 10*3/uL — AB (ref 1.4–7.7)
NEUTROS PCT: 78.7 % — AB (ref 43.0–77.0)
PLATELETS: 216 10*3/uL (ref 150.0–400.0)
RBC: 4.81 Mil/uL (ref 4.22–5.81)
RDW: 13.6 % (ref 11.5–15.5)
WBC: 14.8 10*3/uL — ABNORMAL HIGH (ref 4.0–10.5)

## 2017-12-16 LAB — COMPREHENSIVE METABOLIC PANEL
ALK PHOS: 45 U/L (ref 39–117)
ALT: 11 U/L (ref 0–53)
AST: 13 U/L (ref 0–37)
Albumin: 3.8 g/dL (ref 3.5–5.2)
BUN: 24 mg/dL — AB (ref 6–23)
CO2: 29 meq/L (ref 19–32)
Calcium: 9.6 mg/dL (ref 8.4–10.5)
Chloride: 92 mEq/L — ABNORMAL LOW (ref 96–112)
Creatinine, Ser: 1.65 mg/dL — ABNORMAL HIGH (ref 0.40–1.50)
GFR: 43.46 mL/min — ABNORMAL LOW (ref >60.00)
GLUCOSE: 183 mg/dL — AB (ref 70–99)
POTASSIUM: 3.9 meq/L (ref 3.5–5.1)
Sodium: 131 mEq/L — ABNORMAL LOW (ref 135–145)
TOTAL PROTEIN: 7.2 g/dL (ref 6.0–8.3)
Total Bilirubin: 1.4 mg/dL — ABNORMAL HIGH (ref 0.2–1.2)

## 2017-12-16 LAB — HEMOGLOBIN A1C: HEMOGLOBIN A1C: 7.4 % — AB (ref 4.6–6.5)

## 2017-12-16 MED ORDER — GLUCOSE BLOOD VI STRP: ORAL_STRIP | 3 refills | Status: DC

## 2017-12-16 MED ORDER — CIPROFLOXACIN HCL 500 MG PO TABS: 500.0000 mg | ORAL_TABLET | Freq: Two times a day (BID) | ORAL | 0 refills | Status: DC

## 2017-12-16 NOTE — Assessment & Plan Note (Signed)
He feels her urinary frequency is related to the Jardiance and he will discontinue this Given that he is having a fever not feeling well we will also check a urinalysis, urine culture to rule out possible infection Can follow-up next month after discontinuing Jardiance to see if there is improvement

## 2017-12-16 NOTE — Progress Notes (Signed)
Subjective:    Patient ID: Albert Hawkins, male    DOB: Aug 30, 1940, 77 y.o.   MRN: [id number]  HPI The patient is here for follow up.  Since starting the jardiance he has had increased urination, increased sensitivity all over his body, chills for the past few nights.  His temp yesterday was 100.6.  Last night it was 99.3.  He would like to discontinue the Jardiance.  He has started coughing yesterday.   The cough is dry.   He does state a little bit of shortness of breath and wheezing at night.  He has not eaten much in the past couple of days.  He also states abdominal pain and distention starting just a few minutes after eating anything.  He has had constipation for the past week.  He typically does not get constipated.  Diabetes: He is taking his medication daily as prescribed. He is not compliant with a diabetic diet.  Recently he has been eating a lot of fruit.  He monitors his sugars irregularly.  His sugar was 178 last night - he does not check it often.    Medications and allergies reviewed with patient and updated if appropriate.  Patient Active Problem List   Diagnosis Date Noted  . CKD (chronic kidney disease) stage 3, GFR 30-59 ml/min (HCC) 07/23/2017  . Long term (current) use of anticoagulants 01/15/2017  . Obesity (BMI 30-39.9) 11/09/2013  . Encounter for therapeutic drug monitoring 06/06/2013  . Complete heart block ([place]) 03/03/2013  . OSA (obstructive sleep apnea) 03/28/2012  . 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 Periodic limb movement disorder (PLMD) 12/25/2011  . Depression 01/13/2010  . LUMBAR RADICULOPATHY 07/20/2008  . Second degree AV block, Mobitz type II 07/18/2008  . Essential hypertension 01/16/2008  . 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 Atrial fibrillation ([place]) 11/17/2007  . HEARING LOSS, CONDUCTIVE, BILATERAL 04/14/2007  . DM (diabetes mellitus), secondary, uncontrolled, with peripheral vascular complications ([place]) 59/16/3846  .  Hyperlipidemia 03/17/2007  . 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 SKIN CANCER, HX OF 05/17/2006    Current Outpatient Medications on File Prior to Visit  Medication Sig Dispense Refill  . acetaminophen (TYLENOL ARTHRITIS PAIN) 650 MG CR tablet Take 650 mg by mouth at bedtime. arthritis    . cetirizine (ZYRTEC) 10 MG tablet Take 10 mg by mouth daily.     . fenofibrate micronized (LOFIBRA) 134 MG capsule TAKE 1 CAPSULE BEFORE BREAKFAST. 90 capsule 3  . FLUoxetine (PROZAC) 10 MG capsule TAKE (1) CAPSULE DAILY. 90 capsule 1  . glimepiride (AMARYL) 2 MG tablet TAKE 1 TABLET EVERY DAY WITH BREAKFAST. 90 tablet 1  . glucose blood (ONE TOUCH ULTRA TEST) test strip Check twice daily as instructed.  Dx diabetes with hyperglycemia 180 each 3  . hydrochlorothiazide (MICROZIDE) 12.5 MG capsule TAKE (1) CAPSULE DAILY. 90 capsule 3  . JARDIANCE 25 MG TABS tablet TAKE 1 TABLET ONCE DAILY. 90 tablet 1  . losartan (COZAAR) 100 MG tablet TAKE 1 TABLET ONCE DAILY. 90 tablet 3  . metFORMIN (GLUCOPHAGE) 500 MG tablet TAKE 2 TABLETS TWICE DAILY WITH MEALS. 360 tablet 1  . Multiple Vitamin (MULTIVITAMIN) tablet Take 1 tablet by mouth daily.      . pravastatin (PRAVACHOL) 20 MG tablet TAKE 1 TABLET ONCE DAILY. 90 tablet 3  . warfarin (COUMADIN) 2 MG tablet TAKE (1) TABLET DAILY AS DIRECTED. 90 tablet 1   No current facility-administered medications on file prior to visit.  Past Medical History:  Diagnosis Date  . Atrial fibrillation (HCC)    paroxysmal, on coumadin  . Benign essential HTN   . Diabetes mellitus type II   . Hearing loss, conductive, bilateral   . Hyperlipidemia   . 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 Mobitz (type) II atrioventricular block    S/P  PPM (SJM) by Dr Rayann Heman  . Neoplasm of uncertain behavior of skin   . PVC's (premature ventricular contractions)   . 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  Sleep disorder     Past Surgical History:  Procedure Laterality Date  . 3 fatty tumors     benign  . HAND SURGERY     Right thumb amputated and reattached   . HERNIA REPAIR     Umbilical  . no colonoscopy     "did not want one" (warfarin goes against it; 11/09/13)  . PACEMAKER PLACEMENT     06/12/2008, Dr. Thompson Grayer  . SKIN CANCER EXCISION     above nose     Social History   Socioeconomic History  . Marital status: Married    Spouse name: Not on file  . Number of children: Not on file  . Years of education: Not on file  . Highest education level: Not on file  Occupational History  . Not on file  Social Needs  . Financial resource strain: Not on file  . Food insecurity:    Worry: Not on file    Inability: Not on file  . Transportation needs:    Medical: Not on file    Non-medical: Not on file  Tobacco Use  . Smoking status: Former Smoker    Packs/day: 2.00    Years: 10.00    Pack years: 20.00    Types: Cigarettes    Last attempt to quit: 04/27/1985    Years since quitting: 32.6  . Smokeless tobacco: Never Used  . Tobacco comment: smoked age [age], maximum regular consumption up to 1 ppd  Substance and Sexual Activity  . Alcohol use: No  . Drug use: No  . Sexual activity: Not on file  Lifestyle  . Physical activity:    Days per week: Not on file    Minutes per session: Not on file  . Stress: Not on file  Relationships  . Social connections:    Talks on phone: Not on file    Gets together: Not on file    Attends religious service: Not on file    Active member of club or organization: Not on file    Attends meetings of clubs or organizations: Not on file    Relationship status: Not on file  Other Topics Concern  . Not on file  Social History Narrative  . Not on file    Family History  Problem Relation Age of Onset  . Heart attack Paternal Grandfather 89  . Diabetes Paternal Uncle   . Diabetes Maternal Grandfather   . Skin cancer Father   .  Emphysema Father   . Coronary artery disease Father        stent  . Diabetes Maternal Aunt   . Diabetes Maternal Uncle   . Stroke Neg Hx     Review of Systems  Constitutional: Positive for appetite change (decreased x 1 week), chills and fever.  HENT: Negative for congestion, ear pain, sinus pressure, sinus pain and sore throat.  Respiratory: Positive for cough (dry cough x 2 day2), shortness of breath (a little) and wheezing (little at night).   Cardiovascular: Positive for chest pain (after he eats from bloating). Negative for palpitations and leg swelling.  Gastrointestinal: Positive for abdominal distention, abdominal pain (after he eats - bloating) and constipation (for the past week). Negative for diarrhea and nausea.       No gerd  Genitourinary: Positive for frequency. Negative for dysuria and hematuria.  Neurological: Positive for light-headedness. Negative for headaches (occ pain in head - like electric pain that is transient).       Objective:   Vitals:   12/16/17 0956  BP: 118/62  Pulse: 71  Resp: 16  Temp: 99.1 F (37.3 C)  SpO2: 96%   BP Readings from Last 3 Encounters:  12/16/17 118/62  07/26/17 (!) 144/74  03/31/17 124/72   Wt Readings from Last 3 Encounters:  12/16/17 201 lb 6.4 oz (91.4 kg)  07/26/17 204 lb (92.5 kg)  03/31/17 204 lb (92.5 kg)   Body mass index is 28.9 kg/m.   Physical Exam    GENERAL APPEARANCE: Appears stated age, well appearing, NAD EYES: conjunctiva clear, no icterus HEENT: bilateral tympanic membranes and ear canals normal, oropharynx with no erythema, no thyromegaly, trachea midline, no cervical or supraclavicular lymphadenopathy LUNGS: Clear to auscultation without wheeze or crackles, unlabored breathing, good air entry bilaterally CARDIOVASCULAR: Normal S1,S2 without murmurs, no edema Abdomen: Soft, mild diffuse tenderness without rebound or guarding SKIN: Warm, dry      Assessment & Plan:    See Problem List for  Assessment and Plan of chronic medical problems.

## 2017-12-16 NOTE — Assessment & Plan Note (Signed)
Has been having a fever over the past couple of days Concern for possible infection Chest x-ray ordered Check urinalysis, urine culture Check CBC, CMP

## 2017-12-16 NOTE — Assessment & Plan Note (Signed)
He is experiencing a dry cough, some wheezing at night and some mild shortness of breath Past couple of days he is also had fevers, chills Concern for possible respiratory infection Chest x-ray today

## 2017-12-16 NOTE — Assessment & Plan Note (Signed)
He is taking his medication daily, except for today.  He is having side effects from the [place] and does not want to continue it so we will discontinue it Continue glimepiride and metformin for now He does not want to start any new medications at this time Stressed the importance of checking his sugars regularly and keeping track of them We will follow-up in 1 month, sooner if sugars are not controlled A1c today

## 2017-12-16 NOTE — Patient Instructions (Addendum)
Have a chest xray and blood work today.  Test(s) ordered today. Your results will be released to [place] (or called to you) after review, usually within 72hours after test completion. If any changes need to be made, you will be notified at that same time.   Medications reviewed and updated.  Changes include:  Stopping the jardiance     Please followup in 1 month

## 2017-12-17 ENCOUNTER — Telehealth: Payer: Self-pay

## 2017-12-17 NOTE — Telephone Encounter (Signed)
Received fax from [facility] stating that cipro and warfarin have possible adverse side effects with each other. They are asking you want to change the medication. Please advise.

## 2017-12-17 NOTE — Telephone Encounter (Signed)
No - INR will be monitored more closely while on medication

## 2017-12-18 LAB — URINE CULTURE
MICRO NUMBER:: 91003044
SPECIMEN QUALITY: ADEQUATE

## 2017-12-21 ENCOUNTER — Ambulatory Visit (INDEPENDENT_AMBULATORY_CARE_PROVIDER_SITE_OTHER): Payer: PPO | Admitting: Internal Medicine

## 2017-12-21 ENCOUNTER — Telehealth: Payer: Self-pay

## 2017-12-21 ENCOUNTER — Encounter: Payer: Self-pay | Admitting: Internal Medicine

## 2017-12-21 ENCOUNTER — Ambulatory Visit (INDEPENDENT_AMBULATORY_CARE_PROVIDER_SITE_OTHER): Payer: PPO | Admitting: General Practice

## 2017-12-21 VITALS — BP 138/74 | HR 60 | Temp 97.7°F | Resp 18 | Ht 70.0 in | Wt 198.0 lb

## 2017-12-21 DIAGNOSIS — Z7901 Long term (current) use of anticoagulants: Secondary | ICD-10-CM

## 2017-12-21 DIAGNOSIS — I4891 Unspecified atrial fibrillation: Secondary | ICD-10-CM | POA: Diagnosis not present

## 2017-12-21 DIAGNOSIS — J069 Acute upper respiratory infection, unspecified: Secondary | ICD-10-CM | POA: Diagnosis not present

## 2017-12-21 LAB — POCT INR: INR: 3.4 — AB (ref 2.0–3.0)

## 2017-12-21 MED ORDER — BENZONATATE 200 MG PO CAPS: 200.0000 mg | ORAL_CAPSULE | Freq: Three times a day (TID) | ORAL | 0 refills | Status: DC | PRN

## 2017-12-21 MED ORDER — HYDROCODONE-HOMATROPINE 5-1.5 MG/5ML PO SYRP: 5.0000 mL | ORAL_SOLUTION | Freq: Three times a day (TID) | ORAL | 0 refills | Status: DC | PRN

## 2017-12-21 NOTE — Assessment & Plan Note (Signed)
His current symptoms are likely viral in nature, but he is already on Cipro for his UTI/possible prostatitis so this should cover anything bacterial Discussed symptomatic treatment Tessalon Perles or Hycodan cough syrup as needed for cough Over-the-counter cold medications as needed Rest, fluids Call if no improvement

## 2017-12-21 NOTE — Patient Instructions (Signed)
Use the prescription cough medication for your cough.   Continue and complete the Cipro.     Call if no improvement or if you have any residual symptoms.

## 2017-12-21 NOTE — Progress Notes (Signed)
Subjective:    Patient ID: Albert Hawkins, male    DOB: [birth year], 77 y.o.   MRN: [id number]  HPI He is here for an acute visit for cold symptoms.  His symptoms started about 6 days ago .  He is experiencing a dry cough.  He states fatigue, generalized achiness, runny nose, sore throat, a little shortness of breath and wheezing.  He also states some headaches and lightheadedness.  He thinks he has had some subjective fevers, but also has a UTI for which she is on Cipro for.  He denies any nasal congestion, sinus pain or ear pain.  He has tried taking nyquil.     Medications and allergies reviewed with patient and updated if appropriate.  Patient Active Problem List   Diagnosis Date Noted  . Dry cough 12/16/2017  . Fever 12/16/2017  . Urinary frequency 12/16/2017  . CKD (chronic kidney disease) stage 3, GFR 30-59 ml/min (HCC) 07/23/2017  . Long term (current) use of anticoagulants 01/15/2017  . Obesity (BMI 30-39.9) 11/09/2013  . Encounter for therapeutic drug monitoring 06/06/2013  . Complete heart block ([place]) 03/03/2013  . OSA (obstructive sleep apnea) 03/28/2012  . 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 Periodic limb movement disorder (PLMD) 12/25/2011  . Depression 01/13/2010  . LUMBAR RADICULOPATHY 07/20/2008  . Second degree AV block, Mobitz type II 07/18/2008  . Essential hypertension 01/16/2008  . 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 Atrial fibrillation ([place]) 11/17/2007  . HEARING LOSS, CONDUCTIVE, BILATERAL 04/14/2007  . DM (diabetes mellitus), secondary, uncontrolled, with peripheral vascular complications ([place]) 02/54/2706  . Hyperlipidemia 03/17/2007  . 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 SKIN CANCER, HX OF 05/17/2006    Current Outpatient Medications on File Prior to Visit  Medication Sig Dispense Refill  . acetaminophen (TYLENOL ARTHRITIS PAIN) 650 MG CR tablet Take 650 mg by mouth at bedtime. arthritis    . cetirizine  (ZYRTEC) 10 MG tablet Take 10 mg by mouth daily.     . ciprofloxacin (CIPRO) 500 MG tablet Take 1 tablet (500 mg total) by mouth 2 (two) times daily. 20 tablet 0  . fenofibrate micronized (LOFIBRA) 134 MG capsule TAKE 1 CAPSULE BEFORE BREAKFAST. 90 capsule 3  . FLUoxetine (PROZAC) 10 MG capsule TAKE (1) CAPSULE DAILY. 90 capsule 1  . glimepiride (AMARYL) 2 MG tablet TAKE 1 TABLET EVERY DAY WITH BREAKFAST. 90 tablet 1  . glucose blood (ONE TOUCH ULTRA TEST) test strip Check twice daily as instructed.  Dx diabetes with hyperglycemia 180 each 3  . hydrochlorothiazide (MICROZIDE) 12.5 MG capsule TAKE (1) CAPSULE DAILY. 90 capsule 3  . losartan (COZAAR) 100 MG tablet TAKE 1 TABLET ONCE DAILY. 90 tablet 3  . metFORMIN (GLUCOPHAGE) 500 MG tablet TAKE 2 TABLETS TWICE DAILY WITH MEALS. 360 tablet 1  . Multiple Vitamin (MULTIVITAMIN) tablet Take 1 tablet by mouth daily.      . pravastatin (PRAVACHOL) 20 MG tablet TAKE 1 TABLET ONCE DAILY. 90 tablet 3  . warfarin (COUMADIN) 2 MG tablet TAKE (1) TABLET DAILY AS DIRECTED. 90 tablet 1   No current facility-administered medications on file prior to visit.     Past Medical History:  Diagnosis Date  . Atrial fibrillation (HCC)    paroxysmal, on coumadin  . Benign essential HTN   . Diabetes mellitus type II   . Hearing loss, conductive, bilateral   . Hyperlipidemia   . 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  Mobitz (type) II atrioventricular block    S/P  PPM (SJM) by Dr Rayann Heman  . Neoplasm of uncertain behavior of skin   . PVC's (premature ventricular contractions)   . 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 Sleep disorder     Past Surgical History:  Procedure Laterality Date  . 3 fatty tumors     benign  . HAND SURGERY     Right thumb amputated and reattached   . HERNIA REPAIR     Umbilical  . no colonoscopy     "did not want one" (warfarin goes against it; 11/09/13)  . PACEMAKER  PLACEMENT     06/12/2008, Dr. Thompson Grayer  . SKIN CANCER EXCISION     above nose     Social History   Socioeconomic History  . Marital status: Married    Spouse name: Not on file  . Number of children: Not on file  . Years of education: Not on file  . Highest education level: Not on file  Occupational History  . Not on file  Social Needs  . Financial resource strain: Not on file  . Food insecurity:    Worry: Not on file    Inability: Not on file  . Transportation needs:    Medical: Not on file    Non-medical: Not on file  Tobacco Use  . Smoking status: Former Smoker    Packs/day: 2.00    Years: 10.00    Pack years: 20.00    Types: Cigarettes    Last attempt to quit: 04/27/1985    Years since quitting: 32.6  . Smokeless tobacco: Never Used  . Tobacco comment: smoked age [age], maximum regular consumption up to 1 ppd  Substance and Sexual Activity  . Alcohol use: No  . Drug use: No  . Sexual activity: Not on file  Lifestyle  . Physical activity:    Days per week: Not on file    Minutes per session: Not on file  . Stress: Not on file  Relationships  . Social connections:    Talks on phone: Not on file    Gets together: Not on file    Attends religious service: Not on file    Active member of club or organization: Not on file    Attends meetings of clubs or organizations: Not on file    Relationship status: Not on file  Other Topics Concern  . Not on file  Social History Narrative  . Not on file    Family History  Problem Relation Age of Onset  . Heart attack Paternal Grandfather 89  . Diabetes Paternal Uncle   . Diabetes Maternal Grandfather   . Skin cancer Father   . Emphysema Father   . Coronary artery disease Father        stent  . Diabetes Maternal Aunt   . Diabetes Maternal Uncle   . Stroke Neg Hx     Review of Systems  Constitutional: Positive for fatigue and fever.  HENT: Positive for rhinorrhea and sore throat. Negative for congestion, ear  pain and sinus pressure.   Respiratory: Positive for cough, shortness of breath ( a little) and wheezing (little).   Gastrointestinal: Negative for diarrhea and nausea.  Neurological: Positive for light-headedness and headaches.       Objective:   Vitals:   12/21/17 1024  BP: 138/74  Pulse: 60  Resp: 18  Temp: 97.7 F (36.5 C)  SpO2: 97%   Filed Weights   12/21/17 1024  Weight: 198 lb (89.8 kg)   Body mass index is 28.41 kg/m.  Wt Readings from Last 3 Encounters:  12/21/17 198 lb (89.8 kg)  12/16/17 201 lb 6.4 oz (91.4 kg)  07/26/17 204 lb (92.5 kg)     Physical Exam GENERAL APPEARANCE: Appears stated age, well appearing, NAD EYES: conjunctiva clear, no icterus HEENT: bilateral tympanic membranes and ear canals normal, oropharynx with mild erythema, no thyromegaly, trachea midline, no cervical or supraclavicular lymphadenopathy LUNGS: Clear to auscultation without wheeze or crackles, unlabored breathing, good air entry bilaterally CARDIOVASCULAR: Normal S1,S2 without murmurs, no edema SKIN: warm, dry        Assessment & Plan:   See Problem List for Assessment and Plan of chronic medical problems.

## 2017-12-21 NOTE — Progress Notes (Signed)
Agree with management.  Maizee Reinhold J Issaiah Seabrooks, MD  

## 2017-12-21 NOTE — Telephone Encounter (Signed)
Patient seen in office today. 

## 2017-12-21 NOTE — Patient Instructions (Addendum)
Pre visit review using our clinic review tool, if applicable. No additional management support is needed unless otherwise documented below in the visit note.  Skip coumadin today (8/27) and then take 1/2 tablet daily until finished with Cipro.  Then continue  to take 1/2 tablet all days except 1 tablet on Mondays/Wednesdays and Fridays.  Re-check in 4 weeks.

## 2017-12-21 NOTE — Telephone Encounter (Signed)
Copied from [place] [phone] [id number]. Topic: General - Other >> Dec 20, 2017  4:24 PM Carolyn Stare wrote:   Pt wife  said husband has a cough and is asking for benzonatate 200 mg for cough    Pharmacy [facility]

## 2017-12-24 ENCOUNTER — Ambulatory Visit: Payer: PPO

## 2017-12-28 ENCOUNTER — Encounter: Payer: Self-pay | Admitting: Internal Medicine

## 2017-12-28 MED ORDER — BUDESONIDE-FORMOTEROL FUMARATE 80-4.5 MCG/ACT IN AERO: 2.0000 | INHALATION_SPRAY | Freq: Two times a day (BID) | RESPIRATORY_TRACT | 3 refills | Status: DC

## 2017-12-28 NOTE — Progress Notes (Signed)
Subjective:    Patient ID: Albert Hawkins, male    DOB: [birth year], 77 y.o.   MRN: [id number]  HPI He is here for an acute visit for cold symptoms.  His symptoms started about 10 days.  He was on Cipro for a UTI.  His cough is can  He is experiencing to do to get worse.  A severe cough that is mostly productive of clear mucus.  He states the cough is deep and his never had a cough is severe.  He states shortness of breath, wheezing and headaches.  He does not have any appetite and has lost weight.  He is having subjective fever, chills and significant fatigue.  He has nasal congestion, but denies sinus pain, sore throat, nausea and chest pain.  He has tried taking Symbicort, completed cipro for a UTI, tessalon perls, cough syrup.  The Symbicort that he started last night has helped.  He feels like he is been able to breathe a little bit better and is less short of breath.   Medications and allergies reviewed with patient and updated if appropriate.  Patient Active Problem List   Diagnosis Date Noted  . Dry cough 12/16/2017  . Fever 12/16/2017  . Urinary frequency 12/16/2017  . CKD (chronic kidney disease) stage 3, GFR 30-59 ml/min (HCC) 07/23/2017  . Long term (current) use of anticoagulants 01/15/2017  . Viral upper respiratory tract infection 05/29/2015  . Obesity (BMI 30-39.9) 11/09/2013  . Encounter for therapeutic drug monitoring 06/06/2013  . Complete heart block ([place]) 03/03/2013  . OSA (obstructive sleep apnea) 03/28/2012  . 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 Periodic limb movement disorder (PLMD) 12/25/2011  . Depression 01/13/2010  . LUMBAR RADICULOPATHY 07/20/2008  . Second degree AV block, Mobitz type II 07/18/2008  . Essential hypertension 01/16/2008  . 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 Atrial fibrillation ([place]) 11/17/2007  . HEARING LOSS, CONDUCTIVE, BILATERAL 04/14/2007  . DM (diabetes mellitus), secondary, uncontrolled, with peripheral vascular  complications ([place]) 51/05/5850  . Hyperlipidemia 03/17/2007  . 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 SKIN CANCER, HX OF 05/17/2006    Current Outpatient Medications on File Prior to Visit  Medication Sig Dispense Refill  . acetaminophen (TYLENOL ARTHRITIS PAIN) 650 MG CR tablet Take 650 mg by mouth at bedtime. arthritis    . benzonatate (TESSALON) 200 MG capsule Take 1 capsule (200 mg total) by mouth 3 (three) times daily as needed for cough. 30 capsule 0  . budesonide-formoterol (SYMBICORT) 80-4.5 MCG/ACT inhaler Inhale 2 puffs into the lungs 2 (two) times daily. 1 Inhaler 3  . cetirizine (ZYRTEC) 10 MG tablet Take 10 mg by mouth daily.     . ciprofloxacin (CIPRO) 500 MG tablet Take 1 tablet (500 mg total) by mouth 2 (two) times daily. 20 tablet 0  . fenofibrate micronized (LOFIBRA) 134 MG capsule TAKE 1 CAPSULE BEFORE BREAKFAST. 90 capsule 3  . FLUoxetine (PROZAC) 10 MG capsule TAKE (1) CAPSULE DAILY. 90 capsule 1  . glimepiride (AMARYL) 2 MG tablet TAKE 1 TABLET EVERY DAY WITH BREAKFAST. 90 tablet 1  . glucose blood (ONE TOUCH ULTRA TEST) test strip Check twice daily as instructed.  Dx diabetes with hyperglycemia 180 each 3  . hydrochlorothiazide (MICROZIDE) 12.5 MG capsule TAKE (1) CAPSULE DAILY. 90 capsule 3  . HYDROcodone-homatropine (HYCODAN) 5-1.5 MG/5ML syrup Take 5 mLs by mouth every 8 (eight) hours as needed for cough. 120 mL 0  . losartan (COZAAR) 100 MG tablet TAKE  1 TABLET ONCE DAILY. 90 tablet 3  . metFORMIN (GLUCOPHAGE) 500 MG tablet TAKE 2 TABLETS TWICE DAILY WITH MEALS. 360 tablet 1  . Multiple Vitamin (MULTIVITAMIN) tablet Take 1 tablet by mouth daily.      . pravastatin (PRAVACHOL) 20 MG tablet TAKE 1 TABLET ONCE DAILY. 90 tablet 3  . warfarin (COUMADIN) 2 MG tablet TAKE (1) TABLET DAILY AS DIRECTED. 90 tablet 1   No current facility-administered medications on file prior to visit.     Past Medical History:  Diagnosis Date  . Atrial  fibrillation (HCC)    paroxysmal, on coumadin  . Benign essential HTN   . Diabetes mellitus type II   . Hearing loss, conductive, bilateral   . Hyperlipidemia   . 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 Mobitz (type) II atrioventricular block    S/P  PPM (SJM) by Dr Rayann Heman  . Neoplasm of uncertain behavior of skin   . PVC's (premature ventricular contractions)   . 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 Sleep disorder     Past Surgical History:  Procedure Laterality Date  . 3 fatty tumors     benign  . HAND SURGERY     Right thumb amputated and reattached   . HERNIA REPAIR     Umbilical  . no colonoscopy     "did not want one" (warfarin goes against it; 11/09/13)  . PACEMAKER PLACEMENT     06/12/2008, Dr. Thompson Grayer  . SKIN CANCER EXCISION     above nose     Social History   Socioeconomic History  . Marital status: Married    Spouse name: Not on file  . Number of children: Not on file  . Years of education: Not on file  . Highest education level: Not on file  Occupational History  . Not on file  Social Needs  . Financial resource strain: Not on file  . Food insecurity:    Worry: Not on file    Inability: Not on file  . Transportation needs:    Medical: Not on file    Non-medical: Not on file  Tobacco Use  . Smoking status: Former Smoker    Packs/day: 2.00    Years: 10.00    Pack years: 20.00    Types: Cigarettes    Last attempt to quit: 04/27/1985    Years since quitting: 32.6  . Smokeless tobacco: Never Used  . Tobacco comment: smoked age [age], maximum regular consumption up to 1 ppd  Substance and Sexual Activity  . Alcohol use: No  . Drug use: No  . Sexual activity: Not on file  Lifestyle  . Physical activity:    Days per week: Not on file    Minutes per session: Not on file  . Stress: Not on file  Relationships  . Social connections:    Talks on phone: Not on file    Gets together: Not  on file    Attends religious service: Not on file    Active member of club or organization: Not on file    Attends meetings of clubs or organizations: Not on file    Relationship status: Not on file  Other Topics Concern  . Not on file  Social History Narrative  . Not on file    Family History  Problem Relation Age of Onset  . Heart attack Paternal Grandfather 89  .  Diabetes Paternal Uncle   . Diabetes Maternal Grandfather   . Skin cancer Father   . Emphysema Father   . Coronary artery disease Father        stent  . Diabetes Maternal Aunt   . Diabetes Maternal Uncle   . Stroke Neg Hx     Review of Systems  Constitutional: Positive for appetite change (no appetite), chills, fatigue and fever (subjective).  HENT: Positive for congestion. Negative for ear pain, sinus pain and sore throat.   Respiratory: Positive for cough (clear mucus), shortness of breath and wheezing.   Cardiovascular: Negative for chest pain.  Gastrointestinal: Negative for nausea.  Neurological: Positive for headaches.       Objective:   Vitals:   12/29/17 1428  BP: 116/64  Pulse: 64  Resp: 16  Temp: 97.7 F (36.5 C)  SpO2: 98%   Filed Weights   12/29/17 1428  Weight: 194 lb 6.4 oz (88.2 kg)   Body mass index is 27.89 kg/m.  Wt Readings from Last 3 Encounters:  12/29/17 194 lb 6.4 oz (88.2 kg)  12/21/17 198 lb (89.8 kg)  12/16/17 201 lb 6.4 oz (91.4 kg)     Physical Exam GENERAL APPEARANCE: Appears stated age, well appearing, NAD EYES: conjunctiva clear, no icterus HEENT: bilateral tympanic membranes and ear canals normal, oropharynx with moderate erythema on tongue and throughout the mouth-no white coating, no thyromegaly, trachea midline, no cervical or supraclavicular lymphadenopathy LUNGS: Clear to auscultation without wheeze or crackles, unlabored breathing, good air entry bilaterally CARDIOVASCULAR: Normal S1,S2 without murmurs, no edema SKIN: warm, dry        Assessment &  Plan:   See Problem List for Assessment and Plan of chronic medical problems.

## 2017-12-29 ENCOUNTER — Encounter: Payer: Self-pay | Admitting: Internal Medicine

## 2017-12-29 ENCOUNTER — Ambulatory Visit (INDEPENDENT_AMBULATORY_CARE_PROVIDER_SITE_OTHER): Payer: PPO | Admitting: Internal Medicine

## 2017-12-29 VITALS — BP 116/64 | HR 64 | Temp 97.7°F | Resp 16 | Ht 70.0 in | Wt 194.4 lb

## 2017-12-29 DIAGNOSIS — J45909 Unspecified asthma, uncomplicated: Secondary | ICD-10-CM | POA: Insufficient documentation

## 2017-12-29 DIAGNOSIS — B370 Candidal stomatitis: Secondary | ICD-10-CM | POA: Insufficient documentation

## 2017-12-29 DIAGNOSIS — J4521 Mild intermittent asthma with (acute) exacerbation: Secondary | ICD-10-CM

## 2017-12-29 DIAGNOSIS — J209 Acute bronchitis, unspecified: Secondary | ICD-10-CM | POA: Diagnosis not present

## 2017-12-29 MED ORDER — NYSTATIN 100000 UNIT/ML MT SUSP: 5.0000 mL | Freq: Four times a day (QID) | OROMUCOSAL | 0 refills | Status: DC

## 2017-12-29 MED ORDER — CEFDINIR 300 MG PO CAPS: 300.0000 mg | ORAL_CAPSULE | Freq: Two times a day (BID) | ORAL | 0 refills | Status: DC

## 2017-12-29 NOTE — Patient Instructions (Addendum)
Take the antibiotic [facility]) as prescribed twice daily for 10 days.   Start the Nystatin after you complete the antibiotic.     Continue the symibort twice daily.    Call if no improvement

## 2017-12-29 NOTE — Assessment & Plan Note (Signed)
On exam and his symptoms suggest thrush Likely related to taking Cipro Start nystatin swish and swallow after he completes the antibiotic

## 2017-12-29 NOTE — Assessment & Plan Note (Addendum)
Symptoms consistent with acute bronchitis and likely has some reactive airway disease He did take Cipro and there is not much improvement in his symptoms and his cough is continued to get worse-he was taking this for a UTI and this may not of covered his respiratory infection Start [employer], Symbicort twice daily, Hycodan cough syrup and other over-the-counter cold medications He did have a chest x-ray recently and it was normal so we will not repeat at this time If no improvement will consider short course of oral steroids

## 2017-12-29 NOTE — Assessment & Plan Note (Signed)
He is experiencing an exacerbation of his reactive airway disease Secondary to acute bronchitis Experiencing cough, wheeze and shortness of breath Improved with Symbicort continue Symbicort twice daily If symptoms do not continue to improve we will consider a short course of oral steroids

## 2018-01-16 NOTE — Progress Notes (Signed)
Subjective:    Patient ID: Albert Hawkins, male    DOB: 1941-03-12, 77 y.o.   MRN: [id number]  HPI The patient is here for follow up of his diabetes.   Cold symptoms, RAD:  He has no appetite.  If he moves around or talks a lot he coughs a lot.  He has been taking the [employer] and some of the codeine cough syrup at night.  He has taken Nyquil at night sometimes too.   The cough is improving, but he is still coughing.  He occasionally brings up white sputum.  He is using the Symbicort.  He is seeing improvement, but it is slow.  He has occasional wheeze.  He denies SOB and fever.    Diabetes: We stopped the jardiance one month ago.  He is taking his other medication daily as prescribed. He is somewhat compliant with a diabetic diet. He is not exercising regularly. He monitors his sugars and they have been running good - 110 this morning.  Ranging 160-180.  Highest was 227.  The lowest was 90. He has not been eating as much so his sugars have been better.   He knows he needs to start exercising and eat better.     Medications and allergies reviewed with patient and updated if appropriate.  Patient Active Problem List   Diagnosis Date Noted  . Thrush 12/29/2017  . Acute bronchitis 12/29/2017  . Reactive airway disease 12/29/2017  . Dry cough 12/16/2017  . Fever 12/16/2017  . Urinary frequency 12/16/2017  . CKD (chronic kidney disease) stage 3, GFR 30-59 ml/min (HCC) 07/23/2017  . Long term (current) use of anticoagulants 01/15/2017  . Viral upper respiratory tract infection 05/29/2015  . Obesity (BMI 30-39.9) 11/09/2013  . Encounter for therapeutic drug monitoring 06/06/2013  . Complete heart block ([place]) 03/03/2013  . OSA (obstructive sleep apnea) 03/28/2012  . 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 Periodic limb movement disorder (PLMD) 12/25/2011  . Depression 01/13/2010  . LUMBAR RADICULOPATHY 07/20/2008  . Second degree AV block, Mobitz type II 07/18/2008  . Essential  hypertension 01/16/2008  . 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 Atrial fibrillation ([place]) 11/17/2007  . HEARING LOSS, CONDUCTIVE, BILATERAL 04/14/2007  . DM (diabetes mellitus), secondary, uncontrolled, with peripheral vascular complications ([place]) 15/83/0940  . Hyperlipidemia 03/17/2007  . 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 SKIN CANCER, HX OF 05/17/2006    Current Outpatient Medications on File Prior to Visit  Medication Sig Dispense Refill  . acetaminophen (TYLENOL ARTHRITIS PAIN) 650 MG CR tablet Take 650 mg by mouth at bedtime. arthritis    . budesonide-formoterol (SYMBICORT) 80-4.5 MCG/ACT inhaler Inhale 2 puffs into the lungs 2 (two) times daily. 1 Inhaler 3  . cetirizine (ZYRTEC) 10 MG tablet Take 10 mg by mouth daily.     . fenofibrate micronized (LOFIBRA) 134 MG capsule TAKE 1 CAPSULE BEFORE BREAKFAST. 90 capsule 3  . FLUoxetine (PROZAC) 10 MG capsule TAKE (1) CAPSULE DAILY. 90 capsule 1  . glimepiride (AMARYL) 2 MG tablet TAKE 1 TABLET EVERY DAY WITH BREAKFAST. 90 tablet 1  . glucose blood (ONE TOUCH ULTRA TEST) test strip Check twice daily as instructed.  Dx diabetes with hyperglycemia 180 each 3  . hydrochlorothiazide (MICROZIDE) 12.5 MG capsule TAKE (1) CAPSULE DAILY. 90 capsule 3  . losartan (COZAAR) 100 MG tablet TAKE 1 TABLET ONCE DAILY. 90 tablet 3  . metFORMIN (GLUCOPHAGE) 500 MG tablet TAKE 2 TABLETS TWICE DAILY WITH MEALS.  360 tablet 1  . Multiple Vitamin (MULTIVITAMIN) tablet Take 1 tablet by mouth daily.      Marland Kitchen nystatin (MYCOSTATIN) 100000 UNIT/ML suspension Take 5 mLs (500,000 Units total) by mouth 4 (four) times daily. Use for 10 days 200 mL 0  . pravastatin (PRAVACHOL) 20 MG tablet TAKE 1 TABLET ONCE DAILY. 90 tablet 3  . warfarin (COUMADIN) 2 MG tablet TAKE (1) TABLET DAILY AS DIRECTED. 90 tablet 1   No current facility-administered medications on file prior to visit.     Past Medical History:  Diagnosis Date  . Atrial  fibrillation (HCC)    paroxysmal, on coumadin  . Benign essential HTN   . Diabetes mellitus type II   . Hearing loss, conductive, bilateral   . Hyperlipidemia   . 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 Mobitz (type) II atrioventricular block    S/P  PPM (SJM) by Dr Rayann Heman  . Neoplasm of uncertain behavior of skin   . PVC's (premature ventricular contractions)   . 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 Sleep disorder     Past Surgical History:  Procedure Laterality Date  . 3 fatty tumors     benign  . HAND SURGERY     Right thumb amputated and reattached   . HERNIA REPAIR     Umbilical  . no colonoscopy     "did not want one" (warfarin goes against it; 11/09/13)  . PACEMAKER PLACEMENT     06/12/2008, Dr. Thompson Grayer  . SKIN CANCER EXCISION     above nose     Social History   Socioeconomic History  . Marital status: Married    Spouse name: Not on file  . Number of children: Not on file  . Years of education: Not on file  . Highest education level: Not on file  Occupational History  . Not on file  Social Needs  . Financial resource strain: Not on file  . Food insecurity:    Worry: Not on file    Inability: Not on file  . Transportation needs:    Medical: Not on file    Non-medical: Not on file  Tobacco Use  . Smoking status: Former Smoker    Packs/day: 2.00    Years: 10.00    Pack years: 20.00    Types: Cigarettes    Last attempt to quit: 04/27/1985    Years since quitting: 32.7  . Smokeless tobacco: Never Used  . Tobacco comment: smoked age [age], maximum regular consumption up to 1 ppd  Substance and Sexual Activity  . Alcohol use: No  . Drug use: No  . Sexual activity: Not on file  Lifestyle  . Physical activity:    Days per week: Not on file    Minutes per session: Not on file  . Stress: Not on file  Relationships  . Social connections:    Talks on phone: Not on file    Gets together: Not  on file    Attends religious service: Not on file    Active member of club or organization: Not on file    Attends meetings of clubs or organizations: Not on file    Relationship status: Not on file  Other Topics Concern  . Not on file  Social History Narrative  . Not on file    Family History  Problem Relation Age of Onset  . Heart attack  Paternal Grandfather 90  . Diabetes Paternal Uncle   . Diabetes Maternal Grandfather   . Skin cancer Father   . Emphysema Father   . Coronary artery disease Father        stent  . Diabetes Maternal Aunt   . Diabetes Maternal Uncle   . Stroke Neg Hx     Review of Systems  Constitutional: Positive for appetite change (dec). Negative for chills and fever.  HENT: Negative for congestion, sinus pain and sore throat.   Respiratory: Positive for cough and wheezing (occ). Negative for chest tightness and shortness of breath.   Cardiovascular: Negative for chest pain.       Objective:   Vitals:   01/17/18 1122  BP: 116/60  Pulse: 67  Resp: 16  Temp: 98.3 F (36.8 C)  SpO2: 97%   BP Readings from Last 3 Encounters:  01/17/18 116/60  12/29/17 116/64  12/21/17 138/74   Wt Readings from Last 3 Encounters:  01/17/18 196 lb 12.8 oz (89.3 kg)  12/29/17 194 lb 6.4 oz (88.2 kg)  12/21/17 198 lb (89.8 kg)   Body mass index is 28.24 kg/m.   Physical Exam    Constitutional: Appears well-developed and well-nourished. No distress.  HENT:  Head: Normocephalic and atraumatic.  Cardiovascular: Normal rate, regular rhythm and normal heart sounds.   No murmur heard. No carotid bruit .  No edema Pulmonary/Chest: Effort normal and breath sounds normal. No respiratory distress. No has no wheezes. No rales.  Skin: Skin is warm and dry. Not diaphoretic.  Psychiatric: Normal mood and affect. Behavior is normal.      Assessment & Plan:    See Problem List for Assessment and Plan of chronic medical problems.

## 2018-01-17 ENCOUNTER — Ambulatory Visit (INDEPENDENT_AMBULATORY_CARE_PROVIDER_SITE_OTHER): Payer: PPO | Admitting: Internal Medicine

## 2018-01-17 ENCOUNTER — Encounter: Payer: Self-pay | Admitting: Internal Medicine

## 2018-01-17 VITALS — BP 116/60 | HR 67 | Temp 98.3°F | Resp 16 | Ht 70.0 in | Wt 196.8 lb

## 2018-01-17 DIAGNOSIS — E1365 Other specified diabetes mellitus with hyperglycemia: Secondary | ICD-10-CM | POA: Diagnosis not present

## 2018-01-17 DIAGNOSIS — J4521 Mild intermittent asthma with (acute) exacerbation: Secondary | ICD-10-CM | POA: Diagnosis not present

## 2018-01-17 DIAGNOSIS — E1351 Other specified diabetes mellitus with diabetic peripheral angiopathy without gangrene: Secondary | ICD-10-CM | POA: Diagnosis not present

## 2018-01-17 DIAGNOSIS — IMO0002 Reserved for concepts with insufficient information to code with codable children: Secondary | ICD-10-CM

## 2018-01-17 NOTE — Patient Instructions (Addendum)
  Medications reviewed and updated.  Changes include :   none    Please followup in 3 months   

## 2018-01-17 NOTE — Assessment & Plan Note (Signed)
His current symptoms from his upper respiratory infection are still consistent with reactive airway disease There is slow improvement-cough is improving, wheezes only occasional minimal and there are no signs of active infection Continue Symbicort Continue symptomatic treatment

## 2018-01-17 NOTE — Assessment & Plan Note (Signed)
Taking metformin daily and glimepiride daily Stop Jardiance secondary to side effects He would like to avoid further medication He knows that he needs to improve his diet start exercising-we discussed this at length and how will not always improve his overall health in addition to avoiding more medications He plans on starting to exercise Encouraged weight loss Improve diet-decrease sugars Follow-up in 3 months No medication changes at this time-continue metformin and glimepiride at current dose

## 2018-01-18 ENCOUNTER — Ambulatory Visit (INDEPENDENT_AMBULATORY_CARE_PROVIDER_SITE_OTHER): Payer: PPO | Admitting: General Practice

## 2018-01-18 DIAGNOSIS — Z7901 Long term (current) use of anticoagulants: Secondary | ICD-10-CM

## 2018-01-18 DIAGNOSIS — I4891 Unspecified atrial fibrillation: Secondary | ICD-10-CM | POA: Diagnosis not present

## 2018-01-18 LAB — POCT INR: INR: 7.9 — AB (ref 2.0–3.0)

## 2018-01-18 NOTE — Patient Instructions (Addendum)
Pre visit review using our clinic review tool, if applicable. No additional management support is needed unless otherwise documented below in the visit note.  Stop coumadin through Monday.  Re-check on Tuesday.  Go to ER if any unusual bleeding occurs.

## 2018-01-19 NOTE — Progress Notes (Signed)
Agree with management.  Stacy J Burns, MD  

## 2018-01-25 ENCOUNTER — Ambulatory Visit (INDEPENDENT_AMBULATORY_CARE_PROVIDER_SITE_OTHER): Payer: PPO | Admitting: General Practice

## 2018-01-25 ENCOUNTER — Ambulatory Visit: Payer: PPO

## 2018-01-25 DIAGNOSIS — Z23 Encounter for immunization: Secondary | ICD-10-CM | POA: Diagnosis not present

## 2018-01-25 DIAGNOSIS — Z7901 Long term (current) use of anticoagulants: Secondary | ICD-10-CM

## 2018-01-25 DIAGNOSIS — I4891 Unspecified atrial fibrillation: Secondary | ICD-10-CM

## 2018-01-25 LAB — POCT INR: INR: 2 (ref 2.0–3.0)

## 2018-01-25 NOTE — Patient Instructions (Addendum)
Pre visit review using our clinic review tool, if applicable. No additional management support is needed unless otherwise documented below in the visit note.  Change dosage and take 1/2 tablet all days except 1 tablet on Monday/Fridays.  Re-check in 2 weeks.

## 2018-01-25 NOTE — Progress Notes (Signed)
Agree with management.  Jennet Scroggin J Felissa Blouch, MD  

## 2018-02-11 ENCOUNTER — Ambulatory Visit (INDEPENDENT_AMBULATORY_CARE_PROVIDER_SITE_OTHER): Payer: PPO | Admitting: General Practice

## 2018-02-11 DIAGNOSIS — Z7901 Long term (current) use of anticoagulants: Secondary | ICD-10-CM

## 2018-02-11 DIAGNOSIS — I4891 Unspecified atrial fibrillation: Secondary | ICD-10-CM

## 2018-02-11 LAB — POCT INR: INR: 2.1 (ref 2.0–3.0)

## 2018-02-11 NOTE — Patient Instructions (Addendum)
Pre visit review using our clinic review tool, if applicable. No additional management support is needed unless otherwise documented below in the visit note.  Continue to take 1 mg daily except 2 mg on Monday and Fridays.  Re-check in 4 weeks.  

## 2018-02-11 NOTE — Progress Notes (Signed)
Agree with management.  Trinette Vera J Orit Sanville, MD  

## 2018-03-07 ENCOUNTER — Other Ambulatory Visit: Payer: Self-pay | Admitting: Internal Medicine

## 2018-03-09 ENCOUNTER — Other Ambulatory Visit: Payer: Self-pay | Admitting: Internal Medicine

## 2018-03-11 ENCOUNTER — Ambulatory Visit (INDEPENDENT_AMBULATORY_CARE_PROVIDER_SITE_OTHER): Payer: PPO | Admitting: General Practice

## 2018-03-11 DIAGNOSIS — Z7901 Long term (current) use of anticoagulants: Secondary | ICD-10-CM

## 2018-03-11 DIAGNOSIS — I4891 Unspecified atrial fibrillation: Secondary | ICD-10-CM

## 2018-03-11 LAB — POCT INR: INR: 3 (ref 2.0–3.0)

## 2018-03-11 NOTE — Progress Notes (Signed)
Agree with management.  Albert Kimmons J Sanjana Folz, MD  

## 2018-03-11 NOTE — Patient Instructions (Addendum)
Pre visit review using our clinic review tool, if applicable. No additional management support is needed unless otherwise documented below in the visit note.  Continue to take 1 mg daily except 2 mg on Monday and Fridays.  Re-check in 4 weeks.

## 2018-03-17 ENCOUNTER — Telehealth: Payer: Self-pay | Admitting: Internal Medicine

## 2018-03-17 NOTE — Telephone Encounter (Signed)
Erroneous encounter. SF

## 2018-04-12 NOTE — Progress Notes (Signed)
Cardiology Office Note Date:  04/14/2018  Patient ID:  Albert Hawkins, Albert Hawkins [birth year], MRN [id number] PCP:  Binnie Rail, MD  Cardiologist:  Dr. Rayann Heman    Chief Complaint: annual EP clinic/device visit  History of Present Illness: Albert Hawkins is a 77 y.o. male with history of Mobitz II AVBlock w/PPM, HTN, HLD, DM, Permanent AFib,   He comes in today to be seen for Dr. Rayann Heman.  He last saw him in Dec 2018.  At that time doing well, no changes were made to his therapy or device programming.  Echo in 2017 with mod MR, rec to follow up this year.  He is doing well.  Had a few months in the fall with GI/UTI issues he thinks stemmed from Albert Hawkins.  This has resolved and "back to himself".  He is typically unaware of his AFib, mentions some days he needs "a second cup of coffee" to get going thinks this may be his Afib.  No CP, palpitations or cardiac awareness, no near syncope or syncope.  No bleeding with his warfarin.  He manages this with his PMD.  He states his PMD does his labs routinely as well.    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 Rayann Heman Programmed VVI, unipolar (in looking back for the last year, chronically)  Past Medical History:  Diagnosis Date  . Atrial fibrillation (HCC)    paroxysmal, on coumadin  . Benign essential HTN   . Diabetes mellitus type II   . Hearing loss, conductive, bilateral   . Hyperlipidemia   . 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 Mobitz (type) II atrioventricular block    S/P  PPM (SJM) by Dr Rayann Heman  . Neoplasm of uncertain behavior of skin   . PVC's (premature ventricular contractions)   . 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 Sleep disorder     Past Surgical History:  Procedure Laterality Date  . 3 fatty tumors     benign  . HAND SURGERY     Right thumb amputated and reattached   . HERNIA REPAIR     Umbilical  . no colonoscopy     "did not  want one" (warfarin goes against it; 11/09/13)  . PACEMAKER PLACEMENT     06/12/2008, Dr. Thompson Grayer  . SKIN CANCER EXCISION     above nose     Current Outpatient Medications  Medication Sig Dispense Refill  . acetaminophen (TYLENOL ARTHRITIS PAIN) 650 MG CR tablet Take 650 mg by mouth at bedtime. arthritis    . budesonide-formoterol (SYMBICORT) 80-4.5 MCG/ACT inhaler Inhale 2 puffs into the lungs 2 (two) times daily. 1 Inhaler 3  . cetirizine (ZYRTEC) 10 MG tablet Take 10 mg by mouth daily.     . fenofibrate micronized (LOFIBRA) 134 MG capsule TAKE 1 CAPSULE BEFORE BREAKFAST. 90 capsule 3  . FLUoxetine (PROZAC) 10 MG capsule TAKE (1) CAPSULE DAILY. 90 capsule 0  . glimepiride (AMARYL) 2 MG tablet TAKE 1 TABLET EVERY DAY WITH BREAKFAST. 90 tablet 0  . glucose blood (ONE TOUCH ULTRA TEST) test strip Check twice daily as instructed.  Dx diabetes with hyperglycemia 180 each 3  . hydrochlorothiazide (MICROZIDE) 12.5 MG capsule TAKE (1) CAPSULE DAILY. 90 capsule 3  . losartan (COZAAR) 100 MG tablet TAKE 1 TABLET ONCE DAILY. 90 tablet 3  . metFORMIN (GLUCOPHAGE) 500 MG tablet TAKE 2 TABLETS TWICE  DAILY WITH MEALS. 360 tablet 1  . Multiple Vitamin (MULTIVITAMIN) tablet Take 1 tablet by mouth daily.      . pravastatin (PRAVACHOL) 20 MG tablet TAKE 1 TABLET ONCE DAILY. 90 tablet 3  . warfarin (COUMADIN) 2 MG tablet TAKE (1) TABLET DAILY AS DIRECTED. 90 tablet 1   No current facility-administered medications for this visit.     Allergies:   Ciprofloxacin; Jardiance [empagliflozin]; Amoxicillin; Ramipril; and Tramadol hcl   Social History:  The patient  reports that he quit smoking about 32 years ago. His smoking use included cigarettes. He has a 20.00 pack-year smoking history. He has never used smokeless tobacco. He reports that he does not drink alcohol or use drugs.   Family History:  The patient's family history includes Coronary artery disease in his father; Diabetes in his maternal aunt,  maternal grandfather, maternal uncle, and paternal uncle; Emphysema in his father; Heart attack (age of onset: 9) in his paternal grandfather; Skin cancer in his father.  ROS:  Please see the history of present illness.   All other systems are reviewed and otherwise negative.   PHYSICAL EXAM:  VS:  BP 136/68   Pulse 71   Ht 5\' 10"  (1.778 m)   Wt 203 lb (92.1 kg)   BMI 29.13 kg/m  BMI: Body mass index is 29.13 kg/m. Well nourished, well developed, in no acute distress  HEENT: normocephalic, atraumatic  Neck: no JVD, carotid bruits or masses Cardiac:  irreg-irreg; soft SM, no rubs, or gallops Lungs:  CTA b/l, no wheezing, rhonchi or rales  Abd: soft, nontender MS: no deformity or atrophy Ext: no edema  Skin: warm and dry, no rash Neuro:  No gross deficits appreciated Psych: euthymic mood, full affect  PPM site is stable, no tethering or discomfort   EKG:  Done today and reviewed by myself shows AFib, V paced some intrinsic beats, 71bpm PPM interrogation done today and reviewed by myself: battery and lead measurements are good, he is VS today 60's with intermittent pacing, VP 77%  12/20/17L TTE Study Conclusions - Left ventricle: The cavity size was normal. There was mild   concentric hypertrophy. Systolic function was normal. The   estimated ejection fraction was in the range of 55% to 60%.   Incoordinate (paced) distal septal motion. The study is not   technically sufficient to allow evaluation of LV diastolic   function. - Aortic valve: Trileaflet. Mildly calcified, especially at the   junction of the left and right coronary cusps. There was trivial   regurgitation. - Mitral valve: Mildly thickened leaflets with late systolic   prolapse of the AMVL . There was moderate regurgitation. - Left atrium: Moderately dilated. - Right ventricle: The cavity size was normal. Wall thickness was   normal. Pacer wire or catheter noted in right ventricle. Systolic   function was  normal. - Right atrium: The atrium was mildly dilated. Pacer wire or   catheter noted in right atrium. - Tricuspid valve: There was mild regurgitation. - Pulmonary arteries: PA peak pressure: 38 mm Hg (S). - Inferior vena cava: The vessel was normal in size. The   respirophasic diameter changes were in the normal range (= 50%),   consistent with normal central venous pressure. Impressions: - LVEF 55-60%, mild LVH, incoordinate (paced) distal septal motion,   AMVL prolpase with moderate MR, moderate LAE, pacemaker wires   notede, mild RAE, mild TR, RVSP 38 mmHg, normal IVC.   03/21/14, TTE Study Conclusions - Left ventricle:  The cavity size was normal. There was mild focal basal hypertrophy of the septum. Systolic function was normal. The estimated ejection fraction was in the range of 50% to 55%. There is akinesis of the apical myocardium. - Left atrium: The atrium was mildly dilated. - Right ventricle: Systolic pressure was increased. Impressions: - When compared to prior, apical akinesis is new.  Recent Labs: 07/26/2017: TSH 2.06 12/16/2017: ALT 11; BUN 24; Creatinine, Ser 1.65; Hemoglobin 14.0; Platelets 216.0; Potassium 3.9; Sodium 131  07/26/2017: Cholesterol 154; HDL 43.20; LDL Cholesterol 71; Total CHOL/HDL Ratio 4; Triglycerides 197.0; VLDL 39.4   CrCl cannot be calculated (Patient's most recent lab result is older than the maximum 21 days allowed.).   Wt Readings from Last 3 Encounters:  04/14/18 203 lb (92.1 kg)  01/17/18 196 lb 12.8 oz (89.3 kg)  12/29/17 194 lb 6.4 oz (88.2 kg)     Other studies reviewed: Additional studies/records reviewed today include: summarized above  ASSESSMENT AND PLAN:  1. CHB w/PPM     intac function, no programming changes made  2. Permanent Afib     No symptoms     CHA2DS2Vasc is at least 3, on Warfarin  3. HTN     Looks OK, no changes today    Disposition: he does not do remotes, will have him see device clinic in 6 mo,  and provider annually, sooner if needed, will get his echo updated.  Current medicines are reviewed at length with the patient today.  The patient did not have any concerns regarding medicines.  Haywood Lasso, PA-C 04/14/2018 11:08 AM     [facility] [place] [address] [phone] (office)  [phone] (fax)

## 2018-04-14 ENCOUNTER — Ambulatory Visit (INDEPENDENT_AMBULATORY_CARE_PROVIDER_SITE_OTHER): Payer: PPO | Admitting: Physician Assistant

## 2018-04-14 VITALS — BP 136/68 | HR 71 | Ht 70.0 in | Wt 203.0 lb

## 2018-04-14 DIAGNOSIS — I340 Nonrheumatic mitral (valve) insufficiency: Secondary | ICD-10-CM | POA: Diagnosis not present

## 2018-04-14 DIAGNOSIS — I495 Sick sinus syndrome: Secondary | ICD-10-CM

## 2018-04-14 DIAGNOSIS — I10 Essential (primary) hypertension: Secondary | ICD-10-CM | POA: Diagnosis not present

## 2018-04-14 DIAGNOSIS — I4821 Permanent atrial fibrillation: Secondary | ICD-10-CM | POA: Diagnosis not present

## 2018-04-14 DIAGNOSIS — Z950 Presence of cardiac pacemaker: Secondary | ICD-10-CM

## 2018-04-14 NOTE — Patient Instructions (Addendum)
Medication Instructions:   If you need a refill on your cardiac medications before your next appointment, please call your pharmacy.   Lab work: NONE ORDERED  TODAY   If you have labs (blood work) drawn today and your tests are completely normal, you will receive your results only by: Marland Kitchen MyChart Message (if you have MyChart) OR . A paper copy in the mail If you have any lab test that is abnormal or we need to change your treatment, we will call you to review the results.  Testing/Procedures:. Your physician has requested that you have an echocardiogram. Echocardiography is a painless test that uses sound waves to create images of your heart. It provides your doctor with information about the size and shape of your heart and how well your heart's chambers and valves are working. This procedure takes approximately one hour. There are no restrictions for this procedure.   Follow-Up: Your physician wants you to follow-up in: [place] will receive a reminder letter in the mail two months in advance. If you don't receive a letter, please call our office to schedule the follow-up appointment.   IN 6 MONTHS WITH DEVICE CLINIC FOR PACER CHECK   Any Other Special Instructions Will Be Listed Below (If Applicable).

## 2018-04-15 ENCOUNTER — Ambulatory Visit (INDEPENDENT_AMBULATORY_CARE_PROVIDER_SITE_OTHER): Payer: PPO | Admitting: General Practice

## 2018-04-15 DIAGNOSIS — Z7901 Long term (current) use of anticoagulants: Secondary | ICD-10-CM

## 2018-04-15 DIAGNOSIS — I4891 Unspecified atrial fibrillation: Secondary | ICD-10-CM

## 2018-04-15 LAB — POCT INR: INR: 3.1 — AB (ref 2.0–3.0)

## 2018-04-15 NOTE — Patient Instructions (Addendum)
Pre visit review using our clinic review tool, if applicable. No additional management support is needed unless otherwise documented below in the visit note.  Take 1/2 tablet today and then take 1 mg daily except 2 mg on Mondays.  Re-check in 4 weeks.

## 2018-04-16 NOTE — Progress Notes (Signed)
Agree with management.  Albert Hawkins J Camey Edell, MD  

## 2018-04-28 ENCOUNTER — Telehealth: Payer: Self-pay | Admitting: *Deleted

## 2018-04-28 NOTE — Telephone Encounter (Signed)
LVM for patient to call back in regards to scheduling AWV with our health coach after his visit with PCP scheduled 05/05/18.

## 2018-05-04 NOTE — Assessment & Plan Note (Addendum)
For atrial fibrillation Taking warfarin as prescribed No symptoms consistent with bleeding Taking Aleve on occasion-advised this can affect his warfarin dosing and his kidney function and keep this to an absolute minimum and ideally avoid it completely CBC

## 2018-05-04 NOTE — Progress Notes (Signed)
Subjective:    Patient ID: Albert Hawkins, male    DOB: June 21, 1940, 78 y.o.   MRN: [id number]  HPI The patient is here for follow up.  Diabetes: He is taking his medication daily as prescribed. He is somewhat compliant with a diabetic diet. He is not exercising regularly. He checks his feet daily and denies foot lesions. He is up-to-date with an ophthalmology examination.   Hypertension, CAD, A. fib: He is taking his medication daily. He is compliant with a low sodium diet.  He denies chest pain, palpitations, edema, shortness of breath and regular headaches.  When he has atrial fibrillation the only symptom he feels his fatigue.  He can often tell if this is related to A. fib and not generalized fatigue.  He is not exercising regularly.  He does not monitor his blood pressure at home.  Long-term use of anticoagulants: Takes warfarin for atrial fibrillation.  Taking it as prescribed.  He denies abnormal bruising or bleeding.    Hyperlipidemia: He is taking his medication daily. He is compliant with a low fat/cholesterol diet. He is not exercising regularly. He denies myalgias.   Chronic kidney disease:  He has had right upper back discomfort.  He started taking one aleve at night as needed.  It helps.  He is taking tylenol regularly before bed.  He drinks a good amount of water.  Depression: He is taking his medication daily as prescribed. He denies any side effects from the medication. He feels his depression is well controlled and he is happy with his current dose of medication.   Right upper back pain: He has been experiencing intermittent right upper back pain at night.  He takes Tylenol before he goes to bed, but has been waking up at night.  He started taking 1 Aleve as needed and that does help.  He is to take Aleve years ago.  He knows it does interfere with warfarin and is not good for his kidneys, but is doing that gives him relief.  He has also started using Biofreeze and he thinks  that has helped.  Medications and allergies reviewed with patient and updated if appropriate.  Patient Active Problem List   Diagnosis Date Noted  . Reactive airway disease 12/29/2017  . Dry cough 12/16/2017  . Urinary frequency 12/16/2017  . CKD (chronic kidney disease) stage 3, GFR 30-59 ml/min (HCC) 07/23/2017  . Long term (current) use of anticoagulants 01/15/2017  . Obesity (BMI 30-39.9) 11/09/2013  . Encounter for therapeutic drug monitoring 06/06/2013  . Complete heart block ([place]) 03/03/2013  . OSA (obstructive sleep apnea) 03/28/2012  . 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 Periodic limb movement disorder (PLMD) 12/25/2011  . Depression 01/13/2010  . LUMBAR RADICULOPATHY 07/20/2008  . Second degree AV block, Mobitz type II 07/18/2008  . Essential hypertension 01/16/2008  . 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 Atrial fibrillation ([place]) 11/17/2007  . HEARING LOSS, CONDUCTIVE, BILATERAL 04/14/2007  . DM (diabetes mellitus), secondary, uncontrolled, with peripheral vascular complications ([place]) 50/12/3816  . Hyperlipidemia 03/17/2007  . 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 SKIN CANCER, HX OF 05/17/2006    Current Outpatient Medications on File Prior to Visit  Medication Sig Dispense Refill  . acetaminophen (TYLENOL ARTHRITIS PAIN) 650 MG CR tablet Take 650 mg by mouth at bedtime. arthritis    . cetirizine (ZYRTEC) 10 MG tablet Take 10 mg by mouth daily.     . fenofibrate micronized (LOFIBRA) 134 MG capsule  TAKE 1 CAPSULE BEFORE BREAKFAST. 90 capsule 3  . FLUoxetine (PROZAC) 10 MG capsule TAKE (1) CAPSULE DAILY. 90 capsule 0  . glimepiride (AMARYL) 2 MG tablet TAKE 1 TABLET EVERY DAY WITH BREAKFAST. 90 tablet 0  . glucose blood (ONE TOUCH ULTRA TEST) test strip Check twice daily as instructed.  Dx diabetes with hyperglycemia 180 each 3  . hydrochlorothiazide (MICROZIDE) 12.5 MG capsule TAKE (1) CAPSULE DAILY. 90 capsule 3  . losartan (COZAAR) 100  MG tablet TAKE 1 TABLET ONCE DAILY. 90 tablet 3  . metFORMIN (GLUCOPHAGE) 500 MG tablet TAKE 2 TABLETS TWICE DAILY WITH MEALS. 360 tablet 1  . Multiple Vitamin (MULTIVITAMIN) tablet Take 1 tablet by mouth daily.      . pravastatin (PRAVACHOL) 20 MG tablet TAKE 1 TABLET ONCE DAILY. 90 tablet 3  . warfarin (COUMADIN) 2 MG tablet TAKE (1) TABLET DAILY AS DIRECTED. 90 tablet 1   No current facility-administered medications on file prior to visit.     Past Medical History:  Diagnosis Date  . Atrial fibrillation (HCC)    paroxysmal, on coumadin  . Benign essential HTN   . Diabetes mellitus type II   . Hearing loss, conductive, bilateral   . Hyperlipidemia   . 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 Mobitz (type) II atrioventricular block    S/P  PPM (SJM) by Dr Rayann Heman  . Neoplasm of uncertain behavior of skin   . PVC's (premature ventricular contractions)   . 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 Sleep disorder     Past Surgical History:  Procedure Laterality Date  . 3 fatty tumors     benign  . HAND SURGERY     Right thumb amputated and reattached   . HERNIA REPAIR     Umbilical  . no colonoscopy     "did not want one" (warfarin goes against it; 11/09/13)  . PACEMAKER PLACEMENT     06/12/2008, Dr. Thompson Grayer  . SKIN CANCER EXCISION     above nose     Social History   Socioeconomic History  . Marital status: Married    Spouse name: Not on file  . Number of children: Not on file  . Years of education: Not on file  . Highest education level: Not on file  Occupational History  . Not on file  Social Needs  . Financial resource strain: Not on file  . Food insecurity:    Worry: Not on file    Inability: Not on file  . Transportation needs:    Medical: Not on file    Non-medical: Not on file  Tobacco Use  . Smoking status: Former Smoker    Packs/day: 2.00    Years: 10.00    Pack years: 20.00    Types:  Cigarettes    Last attempt to quit: 04/27/1985    Years since quitting: 33.0  . Smokeless tobacco: Never Used  . Tobacco comment: smoked age [age], maximum regular consumption up to 1 ppd  Substance and Sexual Activity  . Alcohol use: No  . Drug use: No  . Sexual activity: Not on file  Lifestyle  . Physical activity:    Days per week: Not on file    Minutes per session: Not on file  . Stress: Not on file  Relationships  . Social connections:    Talks on phone: Not on file  Gets together: Not on file    Attends religious service: Not on file    Active member of club or organization: Not on file    Attends meetings of clubs or organizations: Not on file    Relationship status: Not on file  Other Topics Concern  . Not on file  Social History Narrative  . Not on file    Family History  Problem Relation Age of Onset  . Heart attack Paternal Grandfather 89  . Diabetes Paternal Uncle   . Diabetes Maternal Grandfather   . Skin cancer Father   . Emphysema Father   . Coronary artery disease Father        stent  . Diabetes Maternal Aunt   . Diabetes Maternal Uncle   . Stroke Neg Hx     Review of Systems  Constitutional: Positive for fatigue (mild - with Afib). Negative for chills and fever.  Respiratory: Negative for cough, shortness of breath and wheezing.   Cardiovascular: Negative for chest pain and palpitations.  Neurological: Negative for light-headedness and headaches.  Hematological: Does not bruise/bleed easily.       Objective:   Vitals:   05/05/18 1032  BP: 124/62  Pulse: 65  Resp: 16  Temp: 98.3 F (36.8 C)  SpO2: 98%   BP Readings from Last 3 Encounters:  05/05/18 124/62  04/14/18 136/68  01/17/18 116/60   Wt Readings from Last 3 Encounters:  05/05/18 203 lb 12.8 oz (92.4 kg)  04/14/18 203 lb (92.1 kg)  01/17/18 196 lb 12.8 oz (89.3 kg)   Body mass index is 29.24 kg/m.   Physical Exam    Constitutional: Appears well-developed and  well-nourished. No distress.  HENT:  Head: Normocephalic and atraumatic.  Neck: Neck supple. No tracheal deviation present. No thyromegaly present.  No cervical lymphadenopathy Cardiovascular: Normal rate, regular rhythm and normal heart sounds.   No murmur heard. No carotid bruit .  No edema Pulmonary/Chest: Effort normal and breath sounds normal. No respiratory distress. No has no wheezes. No rales.  Skin: Skin is warm and dry. Not diaphoretic.  Psychiatric: Normal mood and affect. Behavior is normal.      Assessment & Plan:    See Problem List for Assessment and Plan of chronic medical problems.

## 2018-05-04 NOTE — Assessment & Plan Note (Deleted)
Atrial fibrillation Taking warfarin  CBC

## 2018-05-04 NOTE — Assessment & Plan Note (Addendum)
Lab Results  Component Value Date   HGBA1C 7.4 (H) 12/16/2017   a1c today encouraged diabetic diet and regular exercise

## 2018-05-05 ENCOUNTER — Encounter: Payer: Self-pay | Admitting: Internal Medicine

## 2018-05-05 ENCOUNTER — Other Ambulatory Visit (INDEPENDENT_AMBULATORY_CARE_PROVIDER_SITE_OTHER): Payer: PPO

## 2018-05-05 ENCOUNTER — Ambulatory Visit (INDEPENDENT_AMBULATORY_CARE_PROVIDER_SITE_OTHER): Payer: PPO | Admitting: Internal Medicine

## 2018-05-05 VITALS — BP 124/62 | HR 65 | Temp 98.3°F | Resp 16 | Ht 70.0 in | Wt 203.8 lb

## 2018-05-05 DIAGNOSIS — I4891 Unspecified atrial fibrillation: Secondary | ICD-10-CM

## 2018-05-05 DIAGNOSIS — E1351 Other specified diabetes mellitus with diabetic peripheral angiopathy without gangrene: Secondary | ICD-10-CM

## 2018-05-05 DIAGNOSIS — I10 Essential (primary) hypertension: Secondary | ICD-10-CM

## 2018-05-05 DIAGNOSIS — IMO0002 Reserved for concepts with insufficient information to code with codable children: Secondary | ICD-10-CM

## 2018-05-05 DIAGNOSIS — F329 Major depressive disorder, single episode, unspecified: Secondary | ICD-10-CM

## 2018-05-05 DIAGNOSIS — N183 Chronic kidney disease, stage 3 unspecified: Secondary | ICD-10-CM

## 2018-05-05 DIAGNOSIS — E7849 Other hyperlipidemia: Secondary | ICD-10-CM | POA: Diagnosis not present

## 2018-05-05 DIAGNOSIS — E1365 Other specified diabetes mellitus with hyperglycemia: Secondary | ICD-10-CM

## 2018-05-05 DIAGNOSIS — Z5181 Encounter for therapeutic drug level monitoring: Secondary | ICD-10-CM

## 2018-05-05 DIAGNOSIS — F32A Depression, unspecified: Secondary | ICD-10-CM

## 2018-05-05 LAB — CBC WITH DIFFERENTIAL/PLATELET
BASOS PCT: 0.9 % (ref 0.0–3.0)
Basophils Absolute: 0.1 10*3/uL (ref 0.0–0.1)
EOS ABS: 0.2 10*3/uL (ref 0.0–0.7)
Eosinophils Relative: 3.3 % (ref 0.0–5.0)
HCT: 43.6 % (ref 39.0–52.0)
Hemoglobin: 14.8 g/dL (ref 13.0–17.0)
Lymphocytes Relative: 30.1 % (ref 12.0–46.0)
Lymphs Abs: 1.8 10*3/uL (ref 0.7–4.0)
MCHC: 33.9 g/dL (ref 30.0–36.0)
MCV: 86.7 fl (ref 78.0–100.0)
Monocytes Absolute: 0.6 10*3/uL (ref 0.1–1.0)
Monocytes Relative: 9.7 % (ref 3.0–12.0)
Neutro Abs: 3.3 10*3/uL (ref 1.4–7.7)
Neutrophils Relative %: 56 % (ref 43.0–77.0)
Platelets: 184 10*3/uL (ref 150.0–400.0)
RBC: 5.03 Mil/uL (ref 4.22–5.81)
RDW: 13.2 % (ref 11.5–15.5)
WBC: 5.9 10*3/uL (ref 4.0–10.5)

## 2018-05-05 LAB — COMPREHENSIVE METABOLIC PANEL
ALT: 24 U/L (ref 0–53)
AST: 28 U/L (ref 0–37)
Albumin: 4.3 g/dL (ref 3.5–5.2)
Alkaline Phosphatase: 34 U/L — ABNORMAL LOW (ref 39–117)
BUN: 21 mg/dL (ref 6–23)
CO2: 26 mEq/L (ref 19–32)
Calcium: 9.8 mg/dL (ref 8.4–10.5)
Chloride: 99 mEq/L (ref 96–112)
Creatinine, Ser: 1.45 mg/dL (ref 0.40–1.50)
GFR: 50.4 mL/min — ABNORMAL LOW (ref >60.00)
Glucose, Bld: 157 mg/dL — ABNORMAL HIGH (ref 70–99)
Potassium: 4.7 mEq/L (ref 3.5–5.1)
Sodium: 136 mEq/L (ref 135–145)
Total Bilirubin: 0.8 mg/dL (ref 0.2–1.2)
Total Protein: 6.7 g/dL (ref 6.0–8.3)

## 2018-05-05 LAB — TSH: TSH: 1.95 u[IU]/mL (ref 0.35–4.50)

## 2018-05-05 LAB — LIPID PANEL
CHOLESTEROL: 161 mg/dL (ref 0–200)
HDL: 45.6 mg/dL (ref >39.00)
NonHDL: 115.75
Total CHOL/HDL Ratio: 4
Triglycerides: 228 mg/dL — ABNORMAL HIGH (ref 0.0–149.0)
VLDL: 45.6 mg/dL — ABNORMAL HIGH (ref 0.0–40.0)

## 2018-05-05 LAB — LDL CHOLESTEROL, DIRECT: Direct LDL: 89 mg/dL

## 2018-05-05 LAB — HEMOGLOBIN A1C: Hgb A1c MFr Bld: 7.5 % — ABNORMAL HIGH (ref 4.6–6.5)

## 2018-05-05 NOTE — Assessment & Plan Note (Signed)
intermittent fatigue with afib No palps, bp Ck cbc, cmp, afib

## 2018-05-05 NOTE — Patient Instructions (Signed)
  Tests ordered today. Your results will be released to MyChart (or called to you) after review, usually within 72hours after test completion. If any changes need to be made, you will be notified at that same time.  Medications reviewed and updated.  Changes include :   none      Please followup in 6 months   

## 2018-05-05 NOTE — Assessment & Plan Note (Signed)
Check lipid panel  Continue daily statin Regular exercise and healthy diet encouraged  

## 2018-05-05 NOTE — Assessment & Plan Note (Signed)
BP well controlled Current regimen effective and well tolerated Continue current medications at current doses cmp  

## 2018-05-05 NOTE — Assessment & Plan Note (Addendum)
Taking aleve only as needed-discussed that he should not be taking medicine.  Absolutely needs to take H&P to minimum because it can decrease his kidney function in addition to interacting with warfarin He typically drinks a good amount of water cmp

## 2018-05-05 NOTE — Assessment & Plan Note (Signed)
Controlled, stable Continue current dose of medication  

## 2018-05-12 ENCOUNTER — Ambulatory Visit (HOSPITAL_COMMUNITY): Payer: PPO | Attending: Cardiovascular Disease

## 2018-05-12 ENCOUNTER — Other Ambulatory Visit: Payer: Self-pay

## 2018-05-12 DIAGNOSIS — I340 Nonrheumatic mitral (valve) insufficiency: Secondary | ICD-10-CM | POA: Diagnosis not present

## 2018-05-13 ENCOUNTER — Ambulatory Visit (INDEPENDENT_AMBULATORY_CARE_PROVIDER_SITE_OTHER): Payer: PPO | Admitting: General Practice

## 2018-05-13 DIAGNOSIS — I4891 Unspecified atrial fibrillation: Secondary | ICD-10-CM

## 2018-05-13 DIAGNOSIS — Z7901 Long term (current) use of anticoagulants: Secondary | ICD-10-CM

## 2018-05-13 LAB — POCT INR: INR: 2.3 (ref 2.0–3.0)

## 2018-05-13 NOTE — Progress Notes (Signed)
Agree with management.  Kharma Sampsel J Makensie Mulhall, MD  

## 2018-05-13 NOTE — Patient Instructions (Addendum)
Pre visit review using our clinic review tool, if applicable. No additional management support is needed unless otherwise documented below in the visit note.  Take 1/2 tablet today and then take 1 mg daily except 2 mg on Mondays.  Re-check in 6 weeks.

## 2018-06-08 ENCOUNTER — Other Ambulatory Visit: Payer: Self-pay | Admitting: Internal Medicine

## 2018-06-24 ENCOUNTER — Ambulatory Visit: Payer: PPO

## 2018-06-28 ENCOUNTER — Ambulatory Visit (INDEPENDENT_AMBULATORY_CARE_PROVIDER_SITE_OTHER): Payer: PPO | Admitting: General Practice

## 2018-06-28 ENCOUNTER — Ambulatory Visit: Payer: PPO

## 2018-06-28 DIAGNOSIS — Z7901 Long term (current) use of anticoagulants: Secondary | ICD-10-CM

## 2018-06-28 DIAGNOSIS — I4891 Unspecified atrial fibrillation: Secondary | ICD-10-CM

## 2018-06-28 LAB — POCT INR: INR: 1.8 — AB (ref 2.0–3.0)

## 2018-06-28 NOTE — Progress Notes (Signed)
Agree with management.  [name] J [name], MD  

## 2018-06-28 NOTE — Patient Instructions (Addendum)
Pre visit review using our clinic review tool, if applicable. No additional management support is needed unless otherwise documented below in the visit note.  Take 1 tablet today (3/3) and then start taking 1/2 tablet daily except 2 mg on Mondays and Friday.  Re-check in 4 weeks.

## 2018-07-29 ENCOUNTER — Other Ambulatory Visit: Payer: Self-pay

## 2018-07-29 ENCOUNTER — Ambulatory Visit (INDEPENDENT_AMBULATORY_CARE_PROVIDER_SITE_OTHER): Payer: PPO | Admitting: General Practice

## 2018-07-29 DIAGNOSIS — E1121 Type 2 diabetes mellitus with diabetic nephropathy: Secondary | ICD-10-CM | POA: Diagnosis not present

## 2018-07-29 DIAGNOSIS — Z7901 Long term (current) use of anticoagulants: Secondary | ICD-10-CM | POA: Diagnosis not present

## 2018-07-29 DIAGNOSIS — I4891 Unspecified atrial fibrillation: Secondary | ICD-10-CM

## 2018-07-29 LAB — POCT INR: INR: 3.4 — AB (ref 2.0–3.0)

## 2018-07-29 NOTE — Progress Notes (Signed)
Agree with management.  Santresa Levett J Gerilyn Stargell, MD  

## 2018-07-29 NOTE — Patient Instructions (Signed)
Pre visit review using our clinic review tool, if applicable. No additional management support is needed unless otherwise documented below in the visit note.  Skip coumadin today (4/3) and then change dosage and take 1/2 tablet daily except 2 mg on Mondays only. Re-check in 4 weeks.

## 2018-08-26 ENCOUNTER — Ambulatory Visit (INDEPENDENT_AMBULATORY_CARE_PROVIDER_SITE_OTHER): Payer: PPO | Admitting: General Practice

## 2018-08-26 DIAGNOSIS — I4891 Unspecified atrial fibrillation: Secondary | ICD-10-CM

## 2018-08-26 DIAGNOSIS — Z7901 Long term (current) use of anticoagulants: Secondary | ICD-10-CM | POA: Diagnosis not present

## 2018-08-26 LAB — POCT INR: INR: 2.2 (ref 2.0–3.0)

## 2018-08-26 NOTE — Patient Instructions (Addendum)
Pre visit review using our clinic review tool, if applicable. No additional management support is needed unless otherwise documented below in the visit note.  Continue to take 1/2 tablet daily except 2 mg on Mondays only. Re-check in 6 weeks.

## 2018-08-28 NOTE — Progress Notes (Signed)
Agree with management.  Benjermin Korber J Nevayah Faust, MD  

## 2018-09-04 ENCOUNTER — Other Ambulatory Visit: Payer: Self-pay | Admitting: Internal Medicine

## 2018-09-05 DIAGNOSIS — L820 Inflamed seborrheic keratosis: Secondary | ICD-10-CM | POA: Diagnosis not present

## 2018-09-05 DIAGNOSIS — L570 Actinic keratosis: Secondary | ICD-10-CM | POA: Diagnosis not present

## 2018-09-05 DIAGNOSIS — L219 Seborrheic dermatitis, unspecified: Secondary | ICD-10-CM | POA: Diagnosis not present

## 2018-09-05 DIAGNOSIS — Z85828 Personal history of other malignant neoplasm of skin: Secondary | ICD-10-CM | POA: Diagnosis not present

## 2018-09-05 DIAGNOSIS — D225 Melanocytic nevi of trunk: Secondary | ICD-10-CM | POA: Diagnosis not present

## 2018-09-05 DIAGNOSIS — L821 Other seborrheic keratosis: Secondary | ICD-10-CM | POA: Diagnosis not present

## 2018-09-05 DIAGNOSIS — L814 Other melanin hyperpigmentation: Secondary | ICD-10-CM | POA: Diagnosis not present

## 2018-09-13 ENCOUNTER — Other Ambulatory Visit: Payer: Self-pay | Admitting: Internal Medicine

## 2018-10-07 ENCOUNTER — Ambulatory Visit (INDEPENDENT_AMBULATORY_CARE_PROVIDER_SITE_OTHER): Payer: PPO | Admitting: General Practice

## 2018-10-07 ENCOUNTER — Other Ambulatory Visit: Payer: Self-pay

## 2018-10-07 DIAGNOSIS — Z7901 Long term (current) use of anticoagulants: Secondary | ICD-10-CM

## 2018-10-07 LAB — POCT INR: INR: 2.5 (ref 2.0–3.0)

## 2018-10-07 NOTE — Patient Instructions (Signed)
Pre visit review using our clinic review tool, if applicable. No additional management support is needed unless otherwise documented below in the visit note.  Continue to take 1/2 tablet daily except 2 mg on Mondays only. Re-check in 6 weeks.

## 2018-10-18 NOTE — Progress Notes (Signed)
Agree with management.  Stacy J Burns, MD  

## 2018-11-02 NOTE — Progress Notes (Signed)
Subjective:    Patient ID: Albert Hawkins, male    DOB: 1940-06-23, 78 y.o.   MRN: [id number]  HPI The patient is here for follow up.  He is not exercising regularly.    Diabetes: He is taking his medication daily as prescribed. He has been less compliant with a diabetic diet.   He checks his feet daily and denies foot lesions. He is up-to-date with an ophthalmology examination.   CAD, Afib, Hypertension: He is taking his medication daily.  He takes warfarin for his Afib.  He is compliant with a low sodium diet.  He denies chest pain, palpitations, edema, shortness of breath and regular headaches.  He does not monitor his blood pressure at home.    Hyperlipidemia: He is taking his medication daily. He is compliant with a low fat/cholesterol diet. He denies myalgias.   CKD:  He tries to drink enough water during the day. He does not take any nsaids.    Depression: He is taking his medication daily as prescribed. He denies any side effects from the medication. He feels his depression is well controlled and he is happy with his current dose of medication.    Medications and allergies reviewed with patient and updated if appropriate.  Patient Active Problem List   Diagnosis Date Noted  . Reactive airway disease 12/29/2017  . CKD (chronic kidney disease) stage 3, GFR 30-59 ml/min (HCC) 07/23/2017  . Long term (current) use of anticoagulants 01/15/2017  . Obesity (BMI 30-39.9) 11/09/2013  . Encounter for therapeutic drug monitoring 06/06/2013  . Complete heart block ([place]) 03/03/2013  . OSA (obstructive sleep apnea) 03/28/2012  . 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 Periodic limb movement disorder (PLMD) 12/25/2011  . Depression 01/13/2010  . LUMBAR RADICULOPATHY 07/20/2008  . Second degree AV block, Mobitz type II 07/18/2008  . Essential hypertension 01/16/2008  . 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 Atrial fibrillation ([place]) 11/17/2007  . HEARING LOSS, CONDUCTIVE,  BILATERAL 04/14/2007  . DM (diabetes mellitus), secondary, uncontrolled, with peripheral vascular complications ([place]) 75/17/0017  . Hyperlipidemia 03/17/2007  . 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 SKIN CANCER, HX OF 05/17/2006    Current Outpatient Medications on File Prior to Visit  Medication Sig Dispense Refill  . acetaminophen (TYLENOL ARTHRITIS PAIN) 650 MG CR tablet Take 650 mg by mouth at bedtime. arthritis    . cetirizine (ZYRTEC) 10 MG tablet Take 10 mg by mouth daily.     . fenofibrate micronized (LOFIBRA) 134 MG capsule TAKE 1 CAPSULE BEFORE BREAKFAST. 90 capsule 0  . FLUoxetine (PROZAC) 10 MG capsule TAKE (1) CAPSULE DAILY. 90 capsule 0  . glimepiride (AMARYL) 2 MG tablet TAKE 1 TABLET EVERY DAY WITH BREAKFAST. 90 tablet 0  . glucose blood (ONE TOUCH ULTRA TEST) test strip Check twice daily as instructed.  Dx diabetes with hyperglycemia 180 each 3  . hydrochlorothiazide (MICROZIDE) 12.5 MG capsule TAKE (1) CAPSULE DAILY. 90 capsule 0  . losartan (COZAAR) 100 MG tablet TAKE 1 TABLET ONCE DAILY. 90 tablet 0  . metFORMIN (GLUCOPHAGE) 500 MG tablet TAKE 2 TABLETS TWICE DAILY WITH MEALS. 360 tablet 0  . Multiple Vitamin (MULTIVITAMIN) tablet Take 1 tablet by mouth daily.      . pravastatin (PRAVACHOL) 20 MG tablet TAKE 1 TABLET ONCE DAILY. 90 tablet 0  . warfarin (COUMADIN) 2 MG tablet Take 1/2 tablet daily except 1 tablet on Monday or Take as directed by anticoagulation clinic  90 day 75  tablet 3   No current facility-administered medications on file prior to visit.     Past Medical History:  Diagnosis Date  . Atrial fibrillation (HCC)    paroxysmal, on coumadin  . Benign essential HTN   . Diabetes mellitus type II   . Hearing loss, conductive, bilateral   . Hyperlipidemia   . 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 Mobitz (type) II atrioventricular block    S/P  PPM (SJM) by Dr Rayann Heman  . Neoplasm of uncertain  behavior of skin   . PVC's (premature ventricular contractions)   . 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 Sleep disorder     Past Surgical History:  Procedure Laterality Date  . 3 fatty tumors     benign  . HAND SURGERY     Right thumb amputated and reattached   . HERNIA REPAIR     Umbilical  . no colonoscopy     "did not want one" (warfarin goes against it; 11/09/13)  . PACEMAKER PLACEMENT     06/12/2008, Dr. Thompson Grayer  . SKIN CANCER EXCISION     above nose     Social History   Socioeconomic History  . Marital status: Married    Spouse name: Not on file  . Number of children: Not on file  . Years of education: Not on file  . Highest education level: Not on file  Occupational History  . Not on file  Social Needs  . Financial resource strain: Not on file  . Food insecurity    Worry: Not on file    Inability: Not on file  . Transportation needs    Medical: Not on file    Non-medical: Not on file  Tobacco Use  . Smoking status: Former Smoker    Packs/day: 2.00    Years: 10.00    Pack years: 20.00    Types: Cigarettes    Quit date: 04/27/1985    Years since quitting: 33.5  . Smokeless tobacco: Never Used  . Tobacco comment: smoked age [age], maximum regular consumption up to 1 ppd  Substance and Sexual Activity  . Alcohol use: No  . Drug use: No  . Sexual activity: Not on file  Lifestyle  . Physical activity    Days per week: Not on file    Minutes per session: Not on file  . Stress: Not on file  Relationships  . Social Herbalist on phone: Not on file    Gets together: Not on file    Attends religious service: Not on file    Active member of club or organization: Not on file    Attends meetings of clubs or organizations: Not on file    Relationship status: Not on file  Other Topics Concern  . Not on file  Social History Narrative  . Not on file    Family History  Problem Relation Age of Onset  . Heart attack  Paternal Grandfather 89  . Diabetes Paternal Uncle   . Diabetes Maternal Grandfather   . Skin cancer Father   . Emphysema Father   . Coronary artery disease Father        stent  . Diabetes Maternal Aunt   . Diabetes Maternal Uncle   . Stroke Neg Hx     Review of Systems  Constitutional: Negative for chills and fever.  Respiratory: Negative for cough,  shortness of breath and wheezing.   Cardiovascular: Negative for chest pain, palpitations and leg swelling.  Genitourinary: Positive for frequency.  Neurological: Negative for light-headedness, numbness (in feet) and headaches.       Objective:   Vitals:   11/03/18 1051  BP: 120/72  Pulse: 65  Resp: 16  Temp: 97.9 F (36.6 C)  SpO2: 98%   BP Readings from Last 3 Encounters:  11/03/18 120/72  05/05/18 124/62  04/14/18 136/68   Wt Readings from Last 3 Encounters:  11/03/18 204 lb (92.5 kg)  05/05/18 203 lb 12.8 oz (92.4 kg)  04/14/18 203 lb (92.1 kg)   Body mass index is 29.27 kg/m.   Physical Exam    Constitutional: Appears well-developed and well-nourished. No distress.  HENT:  Head: Normocephalic and atraumatic.  Neck: Neck supple. No tracheal deviation present. No thyromegaly present.  No cervical lymphadenopathy Cardiovascular: Normal rate, regular rhythm and normal heart sounds.   1/6 systolic murmur heard. No carotid bruit .  No edema Pulmonary/Chest: Effort normal and breath sounds normal. No respiratory distress. No has no wheezes. No rales.  Skin: Skin is warm and dry. Not diaphoretic.  Psychiatric: Normal mood and affect. Behavior is normal.   Diabetic Foot Exam - Simple   Simple Foot Form Diabetic Foot exam was performed with the following findings: Yes 11/03/2018 11:21 AM  Visual Inspection No deformities, no ulcerations, no other skin breakdown bilaterally: Yes Sensation Testing Intact to touch and monofilament testing bilaterally: Yes Pulse Check Posterior Tibialis and Dorsalis pulse intact  bilaterally: Yes Comments       Assessment & Plan:    See Problem List for Assessment and Plan of chronic medical problems.

## 2018-11-02 NOTE — Patient Instructions (Addendum)
  Tests ordered today. Your results will be released to MyChart (or called to you) after review.  If any changes need to be made, you will be notified at that same time.    Medications reviewed and updated.  Changes include :   none     Please followup in 6 months   

## 2018-11-03 ENCOUNTER — Encounter: Payer: Self-pay | Admitting: Internal Medicine

## 2018-11-03 ENCOUNTER — Other Ambulatory Visit: Payer: Self-pay

## 2018-11-03 ENCOUNTER — Ambulatory Visit (INDEPENDENT_AMBULATORY_CARE_PROVIDER_SITE_OTHER): Payer: PPO | Admitting: Internal Medicine

## 2018-11-03 ENCOUNTER — Other Ambulatory Visit (INDEPENDENT_AMBULATORY_CARE_PROVIDER_SITE_OTHER): Payer: PPO

## 2018-11-03 VITALS — BP 120/72 | HR 65 | Temp 97.9°F | Resp 16 | Ht 70.0 in | Wt 204.0 lb

## 2018-11-03 DIAGNOSIS — I10 Essential (primary) hypertension: Secondary | ICD-10-CM

## 2018-11-03 DIAGNOSIS — I4891 Unspecified atrial fibrillation: Secondary | ICD-10-CM | POA: Diagnosis not present

## 2018-11-03 DIAGNOSIS — E1365 Other specified diabetes mellitus with hyperglycemia: Secondary | ICD-10-CM | POA: Diagnosis not present

## 2018-11-03 DIAGNOSIS — F329 Major depressive disorder, single episode, unspecified: Secondary | ICD-10-CM

## 2018-11-03 DIAGNOSIS — E7849 Other hyperlipidemia: Secondary | ICD-10-CM | POA: Diagnosis not present

## 2018-11-03 DIAGNOSIS — R350 Frequency of micturition: Secondary | ICD-10-CM

## 2018-11-03 DIAGNOSIS — IMO0002 Reserved for concepts with insufficient information to code with codable children: Secondary | ICD-10-CM

## 2018-11-03 DIAGNOSIS — G4733 Obstructive sleep apnea (adult) (pediatric): Secondary | ICD-10-CM

## 2018-11-03 DIAGNOSIS — F32A Depression, unspecified: Secondary | ICD-10-CM

## 2018-11-03 DIAGNOSIS — N183 Chronic kidney disease, stage 3 unspecified: Secondary | ICD-10-CM

## 2018-11-03 DIAGNOSIS — E1351 Other specified diabetes mellitus with diabetic peripheral angiopathy without gangrene: Secondary | ICD-10-CM

## 2018-11-03 LAB — COMPREHENSIVE METABOLIC PANEL
ALT: 24 U/L (ref 0–53)
AST: 25 U/L (ref 0–37)
Albumin: 4.4 g/dL (ref 3.5–5.2)
Alkaline Phosphatase: 37 U/L — ABNORMAL LOW (ref 39–117)
BUN: 23 mg/dL (ref 6–23)
CO2: 27 mEq/L (ref 19–32)
Calcium: 9.9 mg/dL (ref 8.4–10.5)
Chloride: 97 mEq/L (ref 96–112)
Creatinine, Ser: 1.75 mg/dL — ABNORMAL HIGH (ref 0.40–1.50)
GFR: 38.12 mL/min — ABNORMAL LOW (ref >60.00)
Glucose, Bld: 250 mg/dL — ABNORMAL HIGH (ref 70–99)
Potassium: 4.1 mEq/L (ref 3.5–5.1)
Sodium: 135 mEq/L (ref 135–145)
Total Bilirubin: 0.8 mg/dL (ref 0.2–1.2)
Total Protein: 6.9 g/dL (ref 6.0–8.3)

## 2018-11-03 LAB — LIPID PANEL
Cholesterol: 164 mg/dL (ref 0–200)
HDL: 46.7 mg/dL (ref >39.00)
NonHDL: 117.73
Total CHOL/HDL Ratio: 4
Triglycerides: 320 mg/dL — ABNORMAL HIGH (ref 0.0–149.0)
VLDL: 64 mg/dL — ABNORMAL HIGH (ref 0.0–40.0)

## 2018-11-03 LAB — CBC WITH DIFFERENTIAL/PLATELET
Basophils Absolute: 0.1 10*3/uL (ref 0.0–0.1)
Basophils Relative: 0.7 % (ref 0.0–3.0)
Eosinophils Absolute: 0.1 10*3/uL (ref 0.0–0.7)
Eosinophils Relative: 1.7 % (ref 0.0–5.0)
HCT: 44.2 % (ref 39.0–52.0)
Hemoglobin: 14.9 g/dL (ref 13.0–17.0)
Lymphocytes Relative: 21.4 % (ref 12.0–46.0)
Lymphs Abs: 1.7 10*3/uL (ref 0.7–4.0)
MCHC: 33.7 g/dL (ref 30.0–36.0)
MCV: 87.6 fl (ref 78.0–100.0)
Monocytes Absolute: 0.7 10*3/uL (ref 0.1–1.0)
Monocytes Relative: 9.1 % (ref 3.0–12.0)
Neutro Abs: 5.3 10*3/uL (ref 1.4–7.7)
Neutrophils Relative %: 67.1 % (ref 43.0–77.0)
Platelets: 210 10*3/uL (ref 150.0–400.0)
RBC: 5.05 Mil/uL (ref 4.22–5.81)
RDW: 13.6 % (ref 11.5–15.5)
WBC: 8 10*3/uL (ref 4.0–10.5)

## 2018-11-03 LAB — HEMOGLOBIN A1C: Hgb A1c MFr Bld: 8 % — ABNORMAL HIGH (ref 4.6–6.5)

## 2018-11-03 LAB — LDL CHOLESTEROL, DIRECT: Direct LDL: 92 mg/dL

## 2018-11-03 NOTE — Assessment & Plan Note (Signed)
BP well controlled Current regimen effective and well tolerated Continue current medications at current doses cmp  

## 2018-11-03 NOTE — Assessment & Plan Note (Signed)
Controlled, stable Continue current dose of medication  

## 2018-11-03 NOTE — Assessment & Plan Note (Signed)
following with Dr Rayann Heman No palpitations, asymptomatic On warfarin

## 2018-11-03 NOTE — Assessment & Plan Note (Signed)
cmp Trying to drink a lot of water during day Avoiding nsaids

## 2018-11-03 NOTE — Assessment & Plan Note (Signed)
Likely related to BPH-he states his amount of urination is less, but he is going more frequently Discussed possibly starting Flomax-he would like to hold off for now

## 2018-11-03 NOTE — Assessment & Plan Note (Signed)
Check lipid panel  Continue daily statin Regular exercise and healthy diet encouraged  

## 2018-11-03 NOTE — Assessment & Plan Note (Signed)
Check a1c Low sugar / carb diet Stressed regular exercise   

## 2018-11-03 NOTE — Assessment & Plan Note (Addendum)
Not currently using - plans on restarting Stressed the importance of compliance and the potential side effects of not using CPAP

## 2018-11-07 ENCOUNTER — Other Ambulatory Visit: Payer: Self-pay | Admitting: Internal Medicine

## 2018-11-07 ENCOUNTER — Encounter: Payer: Self-pay | Admitting: Internal Medicine

## 2018-11-07 DIAGNOSIS — N183 Chronic kidney disease, stage 3 unspecified: Secondary | ICD-10-CM

## 2018-11-09 ENCOUNTER — Other Ambulatory Visit (INDEPENDENT_AMBULATORY_CARE_PROVIDER_SITE_OTHER): Payer: PPO

## 2018-11-09 DIAGNOSIS — N183 Chronic kidney disease, stage 3 unspecified: Secondary | ICD-10-CM

## 2018-11-09 LAB — BASIC METABOLIC PANEL
BUN: 19 mg/dL (ref 6–23)
CO2: 27 mEq/L (ref 19–32)
Calcium: 8.8 mg/dL (ref 8.4–10.5)
Chloride: 97 mEq/L (ref 96–112)
Creatinine, Ser: 1.45 mg/dL (ref 0.40–1.50)
GFR: 47.35 mL/min — ABNORMAL LOW (ref >60.00)
Glucose, Bld: 217 mg/dL — ABNORMAL HIGH (ref 70–99)
Potassium: 4.1 mEq/L (ref 3.5–5.1)
Sodium: 134 mEq/L — ABNORMAL LOW (ref 135–145)

## 2018-11-10 ENCOUNTER — Encounter: Payer: Self-pay | Admitting: Internal Medicine

## 2018-11-16 ENCOUNTER — Telehealth: Payer: Self-pay

## 2018-11-16 NOTE — Telephone Encounter (Signed)
    COVID-19 Pre-Screening Questions:  . In the past 7 to 10 days have you had a cough,  shortness of breath, headache, congestion, fever (100 or greater) body aches, chills, sore throat, or sudden loss of taste or sense of smell? No . Have you been around anyone with known Covid 19. No . Have you been around anyone who is awaiting Covid 19 test results in the past 7 to 10 days? No . Have you been around anyone who has been exposed to Covid 19, or has mentioned symptoms of Covid 19 within the past 7 to 10 days? No  If you have any concerns/questions about symptoms patients report during screening (either on the phone or at threshold). Contact the provider seeing the patient or DOD for further guidance.  If neither are available contact a member of the leadership team.          Pt answered No to all Covid-19 prescreening questions. I told the pt to wear a mask if he has one. I also let the pt know that we are reducing the number people coming into the office and to come alone if he can. I told him if anything changes between now and his appointment time to please give [place] a call. The pt verbalized understanding.

## 2018-11-17 ENCOUNTER — Other Ambulatory Visit: Payer: Self-pay

## 2018-11-17 ENCOUNTER — Ambulatory Visit (INDEPENDENT_AMBULATORY_CARE_PROVIDER_SITE_OTHER): Payer: PPO | Admitting: *Deleted

## 2018-11-17 DIAGNOSIS — I4891 Unspecified atrial fibrillation: Secondary | ICD-10-CM

## 2018-11-17 LAB — CUP PACEART INCLINIC DEVICE CHECK
Battery Impedance: 4900 Ohm
Battery Voltage: 2.74 V
Brady Statistic RV Percent Paced: 76 %
Date Time Interrogation Session: 20200723111325
Implantable Lead Implant Date: 20100216
Implantable Lead Implant Date: 20100216
Implantable Lead Location: 753859
Implantable Lead Location: 753860
Implantable Pulse Generator Implant Date: 20100216
Lead Channel Impedance Value: 313 Ohm
Lead Channel Pacing Threshold Amplitude: 0.75 V
Lead Channel Pacing Threshold Pulse Width: 0.4 ms
Lead Channel Sensing Intrinsic Amplitude: 11.1 mV
Lead Channel Setting Pacing Amplitude: 2.5 V
Lead Channel Setting Pacing Pulse Width: 0.4 ms
Lead Channel Setting Sensing Sensitivity: 2 mV
Pulse Gen Model: 5826
Pulse Gen Serial Number: 1285538

## 2018-11-17 NOTE — Progress Notes (Signed)
Pacemaker check in clinic. Normal device function. Thresholds, sensing, impedances consistent with previous measurements. Device programmed to maximize longevity. No episode alerts enabled. Device programmed at appropriate safety margins. Histogram distribution appropriate for patient activity level. Device programmed to optimize intrinsic conduction. Estimated longevity 2 yrs. Patient to f/u with Dr Rayann Heman 03/2019 Patient education completed.

## 2018-11-18 ENCOUNTER — Other Ambulatory Visit: Payer: Self-pay

## 2018-11-18 ENCOUNTER — Ambulatory Visit (INDEPENDENT_AMBULATORY_CARE_PROVIDER_SITE_OTHER): Payer: PPO | Admitting: General Practice

## 2018-11-18 DIAGNOSIS — Z7901 Long term (current) use of anticoagulants: Secondary | ICD-10-CM

## 2018-11-18 DIAGNOSIS — I4891 Unspecified atrial fibrillation: Secondary | ICD-10-CM

## 2018-11-18 LAB — POCT INR: INR: 2.3 (ref 2.0–3.0)

## 2018-11-18 NOTE — Patient Instructions (Addendum)
Pre visit review using our clinic review tool, if applicable. No additional management support is needed unless otherwise documented below in the visit note.  Continue to take 1/2 tablet daily except 2 mg on Mondays only. Re-check in 6 weeks.

## 2018-12-02 ENCOUNTER — Other Ambulatory Visit: Payer: Self-pay | Admitting: Internal Medicine

## 2019-01-06 ENCOUNTER — Other Ambulatory Visit: Payer: Self-pay

## 2019-01-06 ENCOUNTER — Ambulatory Visit (INDEPENDENT_AMBULATORY_CARE_PROVIDER_SITE_OTHER): Payer: PPO | Admitting: General Practice

## 2019-01-06 DIAGNOSIS — Z7901 Long term (current) use of anticoagulants: Secondary | ICD-10-CM | POA: Diagnosis not present

## 2019-01-06 DIAGNOSIS — I4891 Unspecified atrial fibrillation: Secondary | ICD-10-CM

## 2019-01-06 LAB — POCT INR: INR: 2.6 (ref 2.0–3.0)

## 2019-01-06 NOTE — Patient Instructions (Signed)
Pre visit review using our clinic review tool, if applicable. No additional management support is needed unless otherwise documented below in the visit note.  Continue to take 1/2 tablet daily except 2 mg on Mondays only. Re-check in 6 weeks.  

## 2019-01-07 NOTE — Progress Notes (Signed)
Agree with management.  Albert Hawkins J [place], MD  

## 2019-01-25 DIAGNOSIS — E119 Type 2 diabetes mellitus without complications: Secondary | ICD-10-CM | POA: Diagnosis not present

## 2019-01-25 LAB — HM DIABETES EYE EXAM

## 2019-02-17 ENCOUNTER — Ambulatory Visit (INDEPENDENT_AMBULATORY_CARE_PROVIDER_SITE_OTHER): Payer: PPO | Admitting: General Practice

## 2019-02-17 ENCOUNTER — Other Ambulatory Visit: Payer: Self-pay

## 2019-02-17 DIAGNOSIS — I4891 Unspecified atrial fibrillation: Secondary | ICD-10-CM

## 2019-02-17 DIAGNOSIS — Z23 Encounter for immunization: Secondary | ICD-10-CM

## 2019-02-17 DIAGNOSIS — Z7901 Long term (current) use of anticoagulants: Secondary | ICD-10-CM

## 2019-02-17 LAB — POCT INR: INR: 2.5 (ref 2.0–3.0)

## 2019-02-17 NOTE — Progress Notes (Signed)
Agree with management.  Albert Hawkins Albert Cherie [place], MD  

## 2019-02-17 NOTE — Patient Instructions (Addendum)
Pre visit review using our clinic review tool, if applicable. No additional management support is needed unless otherwise documented below in the visit note.  Continue to take 1/2 tablet daily except 2 mg on Mondays only. Re-check in 6 weeks.  

## 2019-03-31 ENCOUNTER — Ambulatory Visit (INDEPENDENT_AMBULATORY_CARE_PROVIDER_SITE_OTHER): Payer: PPO | Admitting: General Practice

## 2019-03-31 ENCOUNTER — Other Ambulatory Visit: Payer: Self-pay

## 2019-03-31 DIAGNOSIS — Z7901 Long term (current) use of anticoagulants: Secondary | ICD-10-CM | POA: Diagnosis not present

## 2019-03-31 DIAGNOSIS — I4891 Unspecified atrial fibrillation: Secondary | ICD-10-CM

## 2019-03-31 LAB — POCT INR: INR: 2.9 (ref 2.0–3.0)

## 2019-03-31 NOTE — Patient Instructions (Addendum)
Pre visit review using our clinic review tool, if applicable. No additional management support is needed unless otherwise documented below in the visit note.  Continue to take 1/2 tablet daily except 2 mg on Mondays only. Re-check in 6 weeks.

## 2019-03-31 NOTE — Progress Notes (Signed)
Agree with management.  Stacy J Burns, MD  

## 2019-04-25 ENCOUNTER — Ambulatory Visit (INDEPENDENT_AMBULATORY_CARE_PROVIDER_SITE_OTHER): Payer: PPO | Admitting: *Deleted

## 2019-04-25 ENCOUNTER — Other Ambulatory Visit: Payer: Self-pay

## 2019-04-25 DIAGNOSIS — I441 Atrioventricular block, second degree: Secondary | ICD-10-CM | POA: Diagnosis not present

## 2019-04-25 DIAGNOSIS — I442 Atrioventricular block, complete: Secondary | ICD-10-CM

## 2019-04-25 NOTE — Patient Instructions (Signed)
In clinic check in 6 months.

## 2019-04-26 LAB — CUP PACEART INCLINIC DEVICE CHECK
Battery Impedance: 7800 Ohm
Battery Voltage: 2.74 V
Brady Statistic RV Percent Paced: 76 %
Date Time Interrogation Session: 20201229112700
Implantable Lead Implant Date: 20100216
Implantable Lead Implant Date: 20100216
Implantable Lead Location: 753859
Implantable Lead Location: 753860
Implantable Pulse Generator Implant Date: 20100216
Lead Channel Impedance Value: 337 Ohm
Lead Channel Pacing Threshold Amplitude: 0.75 V
Lead Channel Pacing Threshold Pulse Width: 0.4 ms
Lead Channel Sensing Intrinsic Amplitude: 8.9 mV
Lead Channel Setting Pacing Amplitude: 2.5 V
Lead Channel Setting Pacing Pulse Width: 0.4 ms
Lead Channel Setting Sensing Sensitivity: 2 mV
Pulse Gen Model: 5826
Pulse Gen Serial Number: 1285538

## 2019-04-26 NOTE — Progress Notes (Addendum)
Pacemaker check in clinic. Normal device function. Thresholds, sensing, impedances consistent with previous measurements. Device programmed to maximize longevity. No  high ventricular rates noted. Device programmed at appropriate safety margins. Histogram distribution appropriate for patient activity level. Device programmed to optimize intrinsic conduction. Estimated longevity 1.25 years. Patient scheduled for next in clinic check 10/24/18. Patient education completed.

## 2019-05-05 ENCOUNTER — Encounter: Payer: PPO | Admitting: Physician Assistant

## 2019-05-07 NOTE — Patient Instructions (Addendum)
  Blood work was ordered.   ° ° °Medications reviewed and updated.  Changes include :   none ° ° ° °Please followup in 6 months ° ° °

## 2019-05-07 NOTE — Progress Notes (Signed)
Subjective:    Patient ID: Albert Hawkins, male    DOB: 1940/06/19, 79 y.o.   MRN: [id number]  HPI The patient is here for follow up of their chronic medical problems, including diabetes, Afib, hypertension, hyperlipidemia, CKD, depression.  He is not exercising regularly.    He is taking all of his medications as prescribed.   He has not gained any weight.  He feels he is eating fairly well.  His goal is to get his weight less than 200 and keep it there.  He takes one aleve at night for arthritis if he can not get to sleep. Last week he took it only once.  He knows he needs to avoid it.      Medications and allergies reviewed with patient and updated if appropriate.  Patient Active Problem List   Diagnosis Date Noted  . Reactive airway disease 12/29/2017  . Urinary frequency 12/16/2017  . CKD (chronic kidney disease) stage 3, GFR 30-59 ml/min 07/23/2017  . Long term (current) use of anticoagulants 01/15/2017  . Obesity (BMI 30-39.9) 11/09/2013  . Encounter for therapeutic drug monitoring 06/06/2013  . Complete heart block ([place]) 03/03/2013  . OSA (obstructive sleep apnea) 03/28/2012  . 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 Periodic limb movement disorder (PLMD) 12/25/2011  . Depression 01/13/2010  . LUMBAR RADICULOPATHY 07/20/2008  . Second degree AV block, Mobitz type II 07/18/2008  . Essential hypertension 01/16/2008  . 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 Atrial fibrillation ([place]) 11/17/2007  . HEARING LOSS, CONDUCTIVE, BILATERAL 04/14/2007  . DM (diabetes mellitus), secondary, uncontrolled, with peripheral vascular complications ([place]) [id number]  . Hyperlipidemia 03/17/2007  . 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 SKIN CANCER, HX OF 05/17/2006    Current Outpatient Medications on File Prior to Visit  Medication Sig Dispense Refill  . acetaminophen (TYLENOL ARTHRITIS PAIN) 650 MG CR tablet Take 650 mg by mouth at bedtime. arthritis      . cetirizine (ZYRTEC) 10 MG tablet Take 10 mg by mouth daily.     . fenofibrate micronized (LOFIBRA) 134 MG capsule TAKE 1 CAPSULE BEFORE BREAKFAST. 90 capsule 1  . FLUoxetine (PROZAC) 10 MG capsule TAKE (1) CAPSULE DAILY. 90 capsule 1  . glimepiride (AMARYL) 2 MG tablet TAKE 1 TABLET EVERY DAY WITH BREAKFAST. 90 tablet 1  . glucose blood (ONE TOUCH ULTRA TEST) test strip Check twice daily as instructed.  Dx diabetes with hyperglycemia 180 each 3  . hydrochlorothiazide (MICROZIDE) 12.5 MG capsule TAKE (1) CAPSULE DAILY. 90 capsule 1  . losartan (COZAAR) 100 MG tablet TAKE 1 TABLET ONCE DAILY. 90 tablet 1  . metFORMIN (GLUCOPHAGE) 500 MG tablet TAKE 2 TABLETS TWICE DAILY WITH MEALS. 360 tablet 1  . Multiple Vitamin (MULTIVITAMIN) tablet Take 1 tablet by mouth daily.      . pravastatin (PRAVACHOL) 20 MG tablet TAKE 1 TABLET ONCE DAILY. 90 tablet 1  . warfarin (COUMADIN) 2 MG tablet Take 1/2 tablet daily except 1 tablet on Monday or Take as directed by anticoagulation clinic  90 day 75 tablet 3   No current facility-administered medications on file prior to visit.    Past Medical History:  Diagnosis Date  . Atrial fibrillation (HCC)    paroxysmal, on coumadin  . Benign essential HTN   . Diabetes mellitus type II   . Hearing loss, conductive, bilateral   . Hyperlipidemia   . 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 Mobitz (type)  II atrioventricular block    S/P  PPM (SJM) by Dr Rayann Heman  . Neoplasm of uncertain behavior of skin   . PVC's (premature ventricular contractions)   . 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 Sleep disorder     Past Surgical History:  Procedure Laterality Date  . 3 fatty tumors     benign  . HAND SURGERY     Right thumb amputated and reattached   . HERNIA REPAIR     Umbilical  . no colonoscopy     "did not want one" (warfarin goes against it; 11/09/13)  . PACEMAKER PLACEMENT     06/12/2008, Dr.  Thompson Grayer  . SKIN CANCER EXCISION     above nose     Social History   Socioeconomic History  . Marital status: Married    Spouse name: Not on file  . Number of children: Not on file  . Years of education: Not on file  . Highest education level: Not on file  Occupational History  . Not on file  Tobacco Use  . Smoking status: Former Smoker    Packs/day: 2.00    Years: 10.00    Pack years: 20.00    Types: Cigarettes    Quit date: 04/27/1985    Years since quitting: 34.0  . Smokeless tobacco: Never Used  . Tobacco comment: smoked age [age], maximum regular consumption up to 1 ppd  Substance and Sexual Activity  . Alcohol use: No  . Drug use: No  . Sexual activity: Not on file  Other Topics Concern  . Not on file  Social History Narrative  . Not on file   Social Determinants of Health   Financial Resource Strain:   . Difficulty of Paying Living Expenses: Not on file  Food Insecurity:   . Worried About Charity fundraiser in the Last Year: Not on file  . Ran Out of Food in the Last Year: Not on file  Transportation Needs:   . Lack of Transportation (Medical): Not on file  . Lack of Transportation (Non-Medical): Not on file  Physical Activity:   . Days of Exercise per Week: Not on file  . Minutes of Exercise per Session: Not on file  Stress:   . Feeling of Stress : Not on file  Social Connections:   . Frequency of Communication with Friends and Family: Not on file  . Frequency of Social Gatherings with Friends and Family: Not on file  . Attends Religious Services: Not on file  . Active Member of Clubs or Organizations: Not on file  . Attends Archivist Meetings: Not on file  . Marital Status: Not on file    Family History  Problem Relation Age of Onset  . Heart attack Paternal Grandfather 89  . Diabetes Paternal Uncle   . Diabetes Maternal Grandfather   . Skin cancer Father   . Emphysema Father   . Coronary artery disease Father        stent  .  Diabetes Maternal Aunt   . Diabetes Maternal Uncle   . Stroke Neg Hx     Review of Systems  Constitutional: Negative for chills and fever.  Respiratory: Negative for cough, shortness of breath and wheezing.   Cardiovascular: Negative for chest pain, palpitations and leg swelling.  Musculoskeletal: Positive for arthralgias.  Neurological: Positive for dizziness (on occasion). Negative for light-headedness and headaches.  Objective:   Vitals:   05/08/19 1113  BP: 136/72  Pulse: 65  Resp: 16  Temp: 98.3 F (36.8 C)  SpO2: 99%   BP Readings from Last 3 Encounters:  05/08/19 136/72  11/03/18 120/72  05/05/18 124/62   Wt Readings from Last 3 Encounters:  05/08/19 204 lb 12.8 oz (92.9 kg)  11/03/18 204 lb (92.5 kg)  05/05/18 203 lb 12.8 oz (92.4 kg)   Body mass index is 29.39 kg/m.   Physical Exam    Constitutional: Appears well-developed and well-nourished. No distress.  HENT:  Head: Normocephalic and atraumatic.  Neck: Neck supple. No tracheal deviation present. No thyromegaly present.  No cervical lymphadenopathy Cardiovascular: Normal rate, regular rhythm and normal heart sounds.  2/6 systolic murmur heard. No carotid bruit .  No edema Pulmonary/Chest: Effort normal and breath sounds normal. No respiratory distress. No has no wheezes. No rales.  Skin: Skin is warm and dry. Not diaphoretic.  Psychiatric: Normal mood and affect. Behavior is normal.      Assessment & Plan:    See Problem List for Assessment and Plan of chronic medical problems.    This visit occurred during the SARS-CoV-2 public health emergency.  Safety protocols were in place, including screening questions prior to the visit, additional usage of staff PPE, and extensive cleaning of exam room while observing appropriate contact time as indicated for disinfecting solutions.

## 2019-05-08 ENCOUNTER — Other Ambulatory Visit: Payer: Self-pay

## 2019-05-08 ENCOUNTER — Encounter: Payer: Self-pay | Admitting: Internal Medicine

## 2019-05-08 ENCOUNTER — Ambulatory Visit (INDEPENDENT_AMBULATORY_CARE_PROVIDER_SITE_OTHER): Payer: PPO | Admitting: Internal Medicine

## 2019-05-08 VITALS — BP 136/72 | HR 65 | Temp 98.3°F | Resp 16 | Ht 70.0 in | Wt 204.8 lb

## 2019-05-08 DIAGNOSIS — I10 Essential (primary) hypertension: Secondary | ICD-10-CM

## 2019-05-08 DIAGNOSIS — E1365 Other specified diabetes mellitus with hyperglycemia: Secondary | ICD-10-CM | POA: Diagnosis not present

## 2019-05-08 DIAGNOSIS — IMO0002 Reserved for concepts with insufficient information to code with codable children: Secondary | ICD-10-CM

## 2019-05-08 DIAGNOSIS — E7849 Other hyperlipidemia: Secondary | ICD-10-CM | POA: Diagnosis not present

## 2019-05-08 DIAGNOSIS — N1831 Chronic kidney disease, stage 3a: Secondary | ICD-10-CM | POA: Diagnosis not present

## 2019-05-08 DIAGNOSIS — E1351 Other specified diabetes mellitus with diabetic peripheral angiopathy without gangrene: Secondary | ICD-10-CM

## 2019-05-08 DIAGNOSIS — F3289 Other specified depressive episodes: Secondary | ICD-10-CM | POA: Diagnosis not present

## 2019-05-08 DIAGNOSIS — I4891 Unspecified atrial fibrillation: Secondary | ICD-10-CM | POA: Diagnosis not present

## 2019-05-08 LAB — CBC WITH DIFFERENTIAL/PLATELET
Basophils Absolute: 0 10*3/uL (ref 0.0–0.1)
Basophils Relative: 0.5 % (ref 0.0–3.0)
Eosinophils Absolute: 0.2 10*3/uL (ref 0.0–0.7)
Eosinophils Relative: 3 % (ref 0.0–5.0)
HCT: 42.7 % (ref 39.0–52.0)
Hemoglobin: 14.4 g/dL (ref 13.0–17.0)
Lymphocytes Relative: 25.2 % (ref 12.0–46.0)
Lymphs Abs: 1.8 10*3/uL (ref 0.7–4.0)
MCHC: 33.7 g/dL (ref 30.0–36.0)
MCV: 88 fl (ref 78.0–100.0)
Monocytes Absolute: 0.7 10*3/uL (ref 0.1–1.0)
Monocytes Relative: 9.3 % (ref 3.0–12.0)
Neutro Abs: 4.5 10*3/uL (ref 1.4–7.7)
Neutrophils Relative %: 62 % (ref 43.0–77.0)
Platelets: 181 10*3/uL (ref 150.0–400.0)
RBC: 4.85 Mil/uL (ref 4.22–5.81)
RDW: 13.6 % (ref 11.5–15.5)
WBC: 7.3 10*3/uL (ref 4.0–10.5)

## 2019-05-08 LAB — HEMOGLOBIN A1C: Hgb A1c MFr Bld: 7.8 % — ABNORMAL HIGH (ref 4.6–6.5)

## 2019-05-08 LAB — COMPREHENSIVE METABOLIC PANEL
ALT: 21 U/L (ref 0–53)
AST: 24 U/L (ref 0–37)
Albumin: 4.3 g/dL (ref 3.5–5.2)
Alkaline Phosphatase: 35 U/L — ABNORMAL LOW (ref 39–117)
BUN: 20 mg/dL (ref 6–23)
CO2: 28 mEq/L (ref 19–32)
Calcium: 10 mg/dL (ref 8.4–10.5)
Chloride: 99 mEq/L (ref 96–112)
Creatinine, Ser: 1.37 mg/dL (ref 0.40–1.50)
GFR: 50.49 mL/min — ABNORMAL LOW (ref >60.00)
Glucose, Bld: 175 mg/dL — ABNORMAL HIGH (ref 70–99)
Potassium: 4.2 mEq/L (ref 3.5–5.1)
Sodium: 135 mEq/L (ref 135–145)
Total Bilirubin: 0.8 mg/dL (ref 0.2–1.2)
Total Protein: 6.9 g/dL (ref 6.0–8.3)

## 2019-05-08 LAB — LIPID PANEL
Cholesterol: 141 mg/dL (ref 0–200)
HDL: 42.6 mg/dL (ref >39.00)
NonHDL: 98.16
Total CHOL/HDL Ratio: 3
Triglycerides: 307 mg/dL — ABNORMAL HIGH (ref 0.0–149.0)
VLDL: 61.4 mg/dL — ABNORMAL HIGH (ref 0.0–40.0)

## 2019-05-08 LAB — LDL CHOLESTEROL, DIRECT: Direct LDL: 70 mg/dL

## 2019-05-08 NOTE — Assessment & Plan Note (Signed)
Chronic Controlled, stable Continue current dose of medication - prozac 10 mg daily

## 2019-05-08 NOTE — Assessment & Plan Note (Addendum)
Chronic Check lipid panel  Continue daily statin, fenofibrate Regular exercise and healthy diet encouraged

## 2019-05-08 NOTE — Assessment & Plan Note (Addendum)
Chronic cmp Encouraged plenty of water  On occasion take one aleve - maybe once a week - advised to keep to a minimum

## 2019-05-08 NOTE — Assessment & Plan Note (Signed)
Chronic Check a1c Low sugar / carb diet Stressed regular exercise Encouraged weight loss 

## 2019-05-08 NOTE — Assessment & Plan Note (Signed)
Chronic BP well controlled Current regimen effective and well tolerated Continue current medications at current doses cmp  

## 2019-05-08 NOTE — Assessment & Plan Note (Addendum)
Chronic Following with Dr Rayann Heman Asymptomatic On warfarin Cbc, cmp

## 2019-05-12 ENCOUNTER — Other Ambulatory Visit: Payer: Self-pay

## 2019-05-12 ENCOUNTER — Ambulatory Visit (INDEPENDENT_AMBULATORY_CARE_PROVIDER_SITE_OTHER): Payer: PPO | Admitting: General Practice

## 2019-05-12 DIAGNOSIS — I4891 Unspecified atrial fibrillation: Secondary | ICD-10-CM | POA: Diagnosis not present

## 2019-05-12 DIAGNOSIS — Z7901 Long term (current) use of anticoagulants: Secondary | ICD-10-CM

## 2019-05-12 LAB — POCT INR: INR: 2.2 (ref 2.0–3.0)

## 2019-05-12 NOTE — Patient Instructions (Addendum)
.  lbpcmh  Continue to take 1/2 tablet daily except 2 mg on Mondays only. Re-check in 6 weeks.

## 2019-05-13 NOTE — Progress Notes (Signed)
Agree with management.  Shanae Luo J Sherial Ebrahim, MD  

## 2019-05-16 ENCOUNTER — Ambulatory Visit: Payer: PPO | Attending: Internal Medicine

## 2019-05-16 DIAGNOSIS — Z23 Encounter for immunization: Secondary | ICD-10-CM | POA: Insufficient documentation

## 2019-05-16 NOTE — Progress Notes (Signed)
   Covid-19 Vaccination Clinic  Name:  Albert Hawkins    MRN: [id number] DOB: 20-Feb-1941  05/16/2019  [name] was observed post Covid-19 immunization for 15 minutes without incidence. He was provided with Vaccine Information Sheet and instruction to access the V-Safe system.   [name] was instructed to call 911 with any severe reactions post vaccine: Marland Kitchen Difficulty breathing  . Swelling of your face and throat  . A fast heartbeat  . A bad rash all over your body  . Dizziness and weakness    Immunizations Administered    Name Date Dose VIS Date Route   Pfizer COVID-19 Vaccine 05/16/2019  9:30 AM 0.3 mL 04/07/2019 Intramuscular   Manufacturer: [place]   Lot: [id number]   [place]: [id number]

## 2019-05-23 NOTE — Progress Notes (Signed)
Cardiology Office Note Date:  05/25/2019  Patient ID:  Albert Hawkins, Albert Hawkins Jan 10, 1941, MRN [id number] PCP:  Binnie Rail, MD  Cardiologist:  Dr. Rayann Heman    Chief Complaint: annual EP clinic/device visit  History of Present Illness: Albert Hawkins is a 79 y.o. male with history of Mobitz II AVBlock w/PPM, HTN, HLD, DM, Permanent AFib,   He comes in today to be seen for Dr. Rayann Heman.  He last saw him in Dec 2018.  At that time doing well, no changes were made to his therapy or device programming.  Echo in 2017 with mod MR, rec to follow up this year.  I saw him Dec 2019, he was doing well.  Had a few months in that fall with GI/UTI issues he thinks stemmed from [place].  This has resolved and "back to himself".  He reported typically being unaware of his AFib, mentioned some days he needs "a second cup of coffee" to get going thought this may be his Afib.  No CP, palpitations or cardiac awareness, no near syncope or syncope.  No bleeding with his warfarin, being managed by his PMD.    He was not doing remotes, planned for 6 mo device clinic visit  He is doing well.  Unfortunately since COVID he is not exercising regularly, he and his wife used to go to [employer] sne[employer] and since the shut down they dont do much exercise at all.  He mentions she is having back trouble and since she can't get out and walk he doesn't either.  He denies any CP, SOB with his ADLs, and does their shopping etc without difficulty.  No DOE, no dizzy spells, near syncope or syncope.  He makes mention that he usually gets a bronchitis twice in the winter and has not this year.    No bleeding, he follows his warfarin with his PMD  We discussed his lipid panel, and trigs particularly, he is working on diet and weight loss.  I encouraged his to continue these efforts though also to get back to exercising, even inside his house.     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 Rayann Heman Programmed  VVI, unipolar (chronically)  Past Medical History:  Diagnosis Date  . Atrial fibrillation (HCC)    paroxysmal, on coumadin  . Benign essential HTN   . Diabetes mellitus type II   . Hearing loss, conductive, bilateral   . Hyperlipidemia   . 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 Mobitz (type) II atrioventricular block    S/P  PPM (SJM) by Dr Rayann Heman  . Neoplasm of uncertain behavior of skin   . PVC's (premature ventricular contractions)   . 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 Sleep disorder     Past Surgical History:  Procedure Laterality Date  . 3 fatty tumors     benign  . HAND SURGERY     Right thumb amputated and reattached   . HERNIA REPAIR     Umbilical  . no colonoscopy     "did not want one" (warfarin goes against it; 11/09/13)  . PACEMAKER PLACEMENT     06/12/2008, Dr. Thompson Grayer  . SKIN CANCER EXCISION     above nose     Current Outpatient Medications  Medication Sig Dispense Refill  . acetaminophen (TYLENOL ARTHRITIS PAIN) 650 MG CR tablet Take 650 mg by mouth at  bedtime. arthritis    . cetirizine (ZYRTEC) 10 MG tablet Take 10 mg by mouth daily.     Marland Kitchen FLUoxetine (PROZAC) 10 MG capsule TAKE (1) CAPSULE DAILY. 90 capsule 1  . glimepiride (AMARYL) 2 MG tablet TAKE 1 TABLET EVERY DAY WITH BREAKFAST. 90 tablet 1  . glucose blood (ONE TOUCH ULTRA TEST) test strip Check twice daily as instructed.  Dx diabetes with hyperglycemia 180 each 3  . hydrochlorothiazide (MICROZIDE) 12.5 MG capsule TAKE (1) CAPSULE DAILY. 90 capsule 1  . losartan (COZAAR) 100 MG tablet TAKE 1 TABLET ONCE DAILY. 90 tablet 1  . metFORMIN (GLUCOPHAGE) 500 MG tablet TAKE 2 TABLETS TWICE DAILY WITH MEALS. 360 tablet 1  . Multiple Vitamin (MULTIVITAMIN) tablet Take 1 tablet by mouth daily.      . pravastatin (PRAVACHOL) 20 MG tablet TAKE 1 TABLET ONCE DAILY. 90 tablet 1  . warfarin (COUMADIN) 2 MG tablet Take 1/2 tablet daily except 1  tablet on Monday or Take as directed by anticoagulation clinic  90 day 75 tablet 3   No current facility-administered medications for this visit.    Allergies:   Ciprofloxacin, Jardiance [empagliflozin], Amoxicillin, Ramipril, and Tramadol hcl   Social History:  The patient  reports that he quit smoking about 34 years ago. His smoking use included cigarettes. He has a 20.00 pack-year smoking history. He has never used smokeless tobacco. He reports that he does not drink alcohol or use drugs.   Family History:  The patient's family history includes Coronary artery disease in his father; Diabetes in his maternal aunt, maternal grandfather, maternal uncle, and paternal uncle; Emphysema in his father; Heart attack (age of onset: 66) in his paternal grandfather; Skin cancer in his father.  ROS:  Please see the history of present illness.   All other systems are reviewed and otherwise negative.   PHYSICAL EXAM:  VS:  BP 120/62   Pulse 61   Ht 5\' 10"  (1.778 m)   Wt 200 lb (90.7 kg)   BMI 28.70 kg/m  BMI: Body mass index is 28.7 kg/m. Well nourished, well developed, in no acute distress  HEENT: normocephalic, atraumatic  Neck: no JVD, carotid bruits or masses Cardiac: irreg-irreg; soft SM, no rubs, or gallops Lungs:  CTA b/l, no wheezing, rhonchi or rales  Abd: soft, nontender MS: no deformity, age appropriate atrophy Ext: no edema  Skin: warm and dry, no rash Neuro:  No gross deficits appreciated Psych: euthymic mood, full affect  PPM site is stable, no tethering or discomfort   EKG:  Done today and reviewed by myself shows  AFib, V paced, one intrinsic beat  PPM interrogation done today and reviewed by myself:  Battery and lead measurements are good 86%VP Underlying today is ion the 50's     04/15/16 TTE Study Conclusions - Left ventricle: The cavity size was normal. There was mild   concentric hypertrophy. Systolic function was normal. The   estimated ejection fraction  was in the range of 55% to 60%.   Incoordinate (paced) distal septal motion. The study is not   technically sufficient to allow evaluation of LV diastolic   function. - Aortic valve: Trileaflet. Mildly calcified, especially at the   junction of the left and right coronary cusps. There was trivial   regurgitation. - Mitral valve: Mildly thickened leaflets with late systolic   prolapse of the AMVL . There was moderate regurgitation. - Left atrium: Moderately dilated. - Right ventricle: The cavity size was normal. Wall  thickness was   normal. Pacer wire or catheter noted in right ventricle. Systolic   function was normal. - Right atrium: The atrium was mildly dilated. Pacer wire or   catheter noted in right atrium. - Tricuspid valve: There was mild regurgitation. - Pulmonary arteries: PA peak pressure: 38 mm Hg (S). - Inferior vena cava: The vessel was normal in size. The   respirophasic diameter changes were in the normal range (= 50%),   consistent with normal central venous pressure. Impressions: - LVEF 55-60%, mild LVH, incoordinate (paced) distal septal motion,   AMVL prolpase with moderate MR, moderate LAE, pacemaker wires   notede, mild RAE, mild TR, RVSP 38 mmHg, normal IVC.   03/21/14, TTE Study Conclusions - Left ventricle: The cavity size was normal. There was mild focal basal hypertrophy of the septum. Systolic function was normal. The estimated ejection fraction was in the range of 50% to 55%. There is akinesis of the apical myocardium. - Left atrium: The atrium was mildly dilated. - Right ventricle: Systolic pressure was increased. Impressions: - When compared to prior, apical akinesis is new.  Recent Labs: 05/08/2019: ALT 21; BUN 20; Creatinine, Ser 1.37; Hemoglobin 14.4; Platelets 181.0; Potassium 4.2; Sodium 135  05/08/2019: Cholesterol 141; Direct LDL 70.0; HDL 42.60; Total CHOL/HDL Ratio 3; Triglycerides 307.0; VLDL 61.4   Estimated Creatinine Clearance:  52 mL/min (by C-G formula based on SCr of 1.37 mg/dL).   Wt Readings from Last 3 Encounters:  05/25/19 200 lb (90.7 kg)  05/08/19 204 lb 12.8 oz (92.9 kg)  11/03/18 204 lb (92.5 kg)     Other studies reviewed: Additional studies/records reviewed today include: summarized above  ASSESSMENT AND PLAN:  1. CHB w/PPM     intact function, no programming changes made     Battery estimate 1.25 years     Will have him in the device clinic in 3 months  2. Permanent Afib     No symptoms     CHA2DS2Vasc is at least 3, on Warfarin  3. HTN     Looks OK, no changes today  4. HLD     Followed by his PMD     Discussed at length     Gave him some written information and advised he visit the American Heart Association web site that has interactive tools and videos on healthy eating, low cholesterol diet choices    Disposition: Device clinic in 3 mo, and Q 3 mo to keep an eye on his battery, MD/APP visit in 1 year, he will be close are ready for gen change then   Current medicines are reviewed at length with the patient today.  The patient did not have any concerns regarding medicines.  Haywood Lasso, PA-C 05/25/2019 11:04 AM     [facility] [place] [address] [phone] (office)  [phone] (fax)

## 2019-05-25 ENCOUNTER — Other Ambulatory Visit: Payer: Self-pay

## 2019-05-25 ENCOUNTER — Ambulatory Visit: Payer: PPO | Admitting: Physician Assistant

## 2019-05-25 VITALS — BP 120/62 | HR 61 | Ht 70.0 in | Wt 200.0 lb

## 2019-05-25 DIAGNOSIS — I4891 Unspecified atrial fibrillation: Secondary | ICD-10-CM | POA: Diagnosis not present

## 2019-05-25 DIAGNOSIS — I442 Atrioventricular block, complete: Secondary | ICD-10-CM | POA: Diagnosis not present

## 2019-05-25 DIAGNOSIS — I10 Essential (primary) hypertension: Secondary | ICD-10-CM | POA: Diagnosis not present

## 2019-05-25 DIAGNOSIS — E785 Hyperlipidemia, unspecified: Secondary | ICD-10-CM

## 2019-05-25 DIAGNOSIS — I4821 Permanent atrial fibrillation: Secondary | ICD-10-CM | POA: Diagnosis not present

## 2019-05-25 DIAGNOSIS — Z950 Presence of cardiac pacemaker: Secondary | ICD-10-CM | POA: Diagnosis not present

## 2019-05-25 NOTE — Patient Instructions (Addendum)
Medication Instructions:    Your physician recommends that you continue on your current medications as directed. Please refer to the Current Medication list given to you today.  *If you need a refill on your cardiac medications before your next appointment, please call your pharmacy*  Lab Work: [place]   If you have labs (blood work) drawn today and your tests are completely normal, you will receive your results only by: Marland Kitchen MyChart Message (if you have MyChart) OR . A paper copy in the mail If you have any lab test that is abnormal or we need to change your treatment, we will call you to review the results.  Testing/Procedures: NONE ORDERED  TODAY   Follow-Up: At [facility], you and your health needs are our priority.  As part of our continuing mission to provide you with exceptional heart care, we have created designated Provider Care Teams.  These Care Teams include your primary Cardiologist (physician) and Advanced Practice Providers (APPs -  Physician Assistants and Nurse Practitioners) who all work together to provide you with the care you need, when you need it.  Your next appointment:  3 MONTHS WITH DEVICE CLINIC ( LEAVE CURRENT DEVICE APPT  IN 09-2019 ALSO )      AND   1 year(s)  The format for your next appointment:   In Person  Provider:   You may see [place]  or one of the following Advanced Practice Providers on your designated Care Team:    Chanetta Marshall, NP  Tommye Standard, PA-C  Legrand Como "Oda [place], [place]   Other Instructions

## 2019-06-01 ENCOUNTER — Other Ambulatory Visit: Payer: Self-pay

## 2019-06-01 ENCOUNTER — Other Ambulatory Visit: Payer: Self-pay | Admitting: Internal Medicine

## 2019-06-01 ENCOUNTER — Telehealth: Payer: Self-pay | Admitting: Internal Medicine

## 2019-06-01 MED ORDER — GLIMEPIRIDE 4 MG PO TABS: 4.0000 mg | ORAL_TABLET | Freq: Every day | ORAL | 1 refills | Status: DC

## 2019-06-01 NOTE — Chronic Care Management (AMB) (Signed)
  Chronic Care Management   Note  06/01/2019 Name: Albert Hawkins MRN: [id number] DOB: 26-Jan-1941  Albert Hawkins is a 79 y.o. year old male who is a primary care patient of Burns, Claudina Lick, MD. I reached out to [name] by phone today in response to a referral sent by Albert Hawkins's PCP, Binnie Rail, MD.   [name] was given information about Chronic Care Management services today including:  1. CCM service includes personalized support from designated clinical staff supervised by his physician, including individualized plan of care and coordination with other care providers 2. 24/7 contact phone numbers for assistance for urgent and routine care needs. 3. Service will only be billed when office clinical staff spend 20 minutes or more in a month to coordinate care. 4. Only one practitioner may furnish and bill the service in a calendar month. 5. The patient may stop CCM services at any time (effective at the end of the month) by phone call to the office staff. 6. The patient will be responsible for cost sharing (co-pay) of up to 20% of the service fee (after annual deductible is met).  Patient agreed to services and verbal consent obtained.   Follow up plan:   [place]

## 2019-06-01 NOTE — Chronic Care Management (AMB) (Signed)
Chronic Care Management Pharmacy  Name: Albert Hawkins  MRN: [id number] DOB: [birth year]  Initial Questions: 1. Have you seen any other providers since your last visit? No  2. Any changes in your medicines or health? No   Chief Complaint/ HPI  Albert Hawkins,  79 y.o. , male presents for their Initial CCM visit with the clinical pharmacist via telephone.   Pt reports he knows his medication and manages them himself. He uses a 30-day pill box that he spends 1 hr setting up each month. He uses [employer] and is "tickled to death" with their service. He pays ~$100 for 90 day supply of all meds and reports this is reasonable for him. He has been isolating with his wife during the pandemic and is getting his second COVID vaccine next week.   PCP : Binnie Rail, MD  Their chronic conditions include: HTN, DM, HLD, CKD, depression  Office Visits:  05/19/19 Dr Quay Burow: patient is stable, no med changes. Encouraged regular exercise and healthy diet. Pt was taking 1 Aleve daily for arthritis, encouraged to reduce to once a week due to kidney issues.   Consult Visit: 05/25/19 Brien Few PA, cardiology: annual EP visit, pt is asymptomatic, no med changes. Pt encouraged to visit AHA website for tools/videos on healthy eating.  Medications: Outpatient Encounter Medications as of 06/02/2019  Medication Sig Note  . acetaminophen (TYLENOL ARTHRITIS PAIN) 650 MG CR tablet Take 650 mg by mouth at bedtime. arthritis   . cetirizine (ZYRTEC) 10 MG tablet Take 10 mg by mouth daily.    Marland Kitchen FLUoxetine (PROZAC) 10 MG capsule TAKE (1) CAPSULE DAILY.   Marland Kitchen glimepiride (AMARYL) 4 MG tablet Take 1 tablet (4 mg total) by mouth daily before breakfast.   . glucose blood (ONE TOUCH ULTRA TEST) test strip Check twice daily as instructed.  Dx diabetes with hyperglycemia   . hydrochlorothiazide (MICROZIDE) 12.5 MG capsule TAKE (1) CAPSULE DAILY.   Marland Kitchen losartan (COZAAR) 100 MG tablet TAKE 1 TABLET ONCE DAILY.   .  metFORMIN (GLUCOPHAGE) 500 MG tablet TAKE 2 TABLETS TWICE DAILY WITH MEALS.   . Multiple Vitamin (MULTIVITAMIN) tablet Take 1 tablet by mouth daily.   02/18/2011: Pt to discuss taking in place of Zyrtec PRN.  . pravastatin (PRAVACHOL) 20 MG tablet TAKE 1 TABLET ONCE DAILY.   Marland Kitchen warfarin (COUMADIN) 2 MG tablet Take 1/2 tablet daily except 1 tablet on Monday or Take as directed by anticoagulation clinic  90 day    No facility-administered encounter medications on file as of 06/02/2019.     Current Diagnosis/Assessment:  Goals Addressed            This Visit's Progress   . Pharmacy Care Plan       Current Barriers:  . Chronic Disease Management support, education, and care coordination needs related to Atrial Fibrillation, HTN, HLD, Hypertriglyceridemia, DMII, and CKD  Pharmacist Clinical Goal(s):  Marland Kitchen Patient will focus on improving diet with goal of A1c < 7% . Patient will maintain BP < 140/90 by continued adherence to medications and healthy lifestyle  Interventions: . Comprehensive medication review performed. . Pt will stop drinking Coke and replace with water . Pt to resume CPAP nightly  Patient Self Care Activities:  . Patient verbalizes understanding of plan to stop drinking Coke, Self administers medications as prescribed, Calls pharmacy for medication refills, and Calls provider office for new concerns or questions  Initial goal documentation     . Reduce  Coke       Pt will not buy more coke for next 30 days    . Restart CPAP       Pt would like to get back in habit of using CPAP       AFIB   Patient is currently rate controlled. HR PPM-controlled  Patient has failed these meds in past: n/a Patient is currently controlled on the following medications: warfarin, PPM. History of heart block so no rate-control meds.  We discussed:  diet and exercise extensively. Discussed Vitamin K and warfarin interaction as pt states he eats okra, collard greens frequently. He  follows with RN warfarin clinic and INR is at goal.  Plan Continue current medications and control with diet and exercise  ,  Diabetes   Recent Relevant Labs: Lab Results  Component Value Date/Time   HGBA1C 7.8 (H) 05/08/2019 12:18 PM   HGBA1C 8.0 (H) 11/03/2018 11:28 AM   MICROALBUR 2.4 (H) 01/30/2016 12:19 PM   MICROALBUR 2.0 (H) 07/30/2015 09:03 AM    Lab Results  Component Value Date   CREATININE 1.37 05/08/2019   CREATININE 1.45 11/09/2018   CREATININE 1.75 (H) 11/03/2018    Checking BG: Rarely. Pt prefers to follow sugars longitudinally with A1c.   Patient has failed these meds in past: Jardiance (UTI/diarrhea), repaglinide  Patient is currently uncontrolled on the following medications: metformin 1000 mg BID, glimepiride 4 mg qAM  Last diabetic Foot exam:  Lab Results  Component Value Date/Time   HMDIABEYEEXA No Retinopathy 02/07/2016 12:00 AM    Last diabetic Eye exam: No results found for: HMDIABFOOTEX   We discussed: diet and exercise extensively. Pt eats many vegetables, but also fried foods and sweets. Pt reports he knows what he needs to avoid to help his sugars. Also discussed A1c goal, agreed on goal of < 7%.  Plan Continue current medications and control with diet and exercise  Cut out Coke  Hypertension   BP today is:  <140/90  Office blood pressures are  BP Readings from Last 3 Encounters:  05/25/19 120/62  05/08/19 136/72  11/03/18 120/72    Patient has failed these meds in the past: valsartan (recall), ramipril (cough)  Patient is currently controlled on: losartan 100 mg daily, HCTZ 12.5 mg daily  Patient checks BP at home several times per month when wife asks him to.   Patient home BP readings are ranging: SBP 130s, occasionally up to 150s  We discussed diet and exercise extensively  Plan Continue current medications and control with diet and exercise    Hyperlipidemia   Lipid Panel     Component Value Date/Time   CHOL 141  05/08/2019 1218   TRIG 307.0 (H) 05/08/2019 1218   HDL 42.60 05/08/2019 1218   CHOLHDL 3 05/08/2019 1218   VLDL 61.4 (H) 05/08/2019 1218   LDLCALC 71 07/26/2017 1154   LDLDIRECT 70.0 05/08/2019 1218    Patient has failed these meds in past: n/a Patient is currently uncontrolled on the following medications: pravastatin 20 mg daily  We discussed:  diet and exercise extensively, particularly reducing fried and processed foods. Pt typically eats fried foods 3-4 times per week.   Plan Continue current medications and control with diet and exercise  Reduce fried foods  Depression    Patient has failed these meds in past: n/a Patient is currently controlled on the following medications: fluoxetine 10 mg daily  We discussed: pt has been on fluoxetine 10 mg daily for many years, since  originally prescribed by Dr Linna Darner "Hop", previous PCP. It works well for him and pt has no issues currently.   Plan Continue current medications     Follow up: -CPP or assistant will call patient in 1 month to check on specific goals -Follow up phone visit in 3 months    Charlene Brooke, PharmD Clinical Pharmacist [facility] at [facility] [phone]

## 2019-06-02 ENCOUNTER — Other Ambulatory Visit: Payer: Self-pay

## 2019-06-02 ENCOUNTER — Ambulatory Visit: Payer: PPO | Admitting: Pharmacist

## 2019-06-02 DIAGNOSIS — IMO0002 Reserved for concepts with insufficient information to code with codable children: Secondary | ICD-10-CM

## 2019-06-02 DIAGNOSIS — E785 Hyperlipidemia, unspecified: Secondary | ICD-10-CM

## 2019-06-02 DIAGNOSIS — E1351 Other specified diabetes mellitus with diabetic peripheral angiopathy without gangrene: Secondary | ICD-10-CM

## 2019-06-02 DIAGNOSIS — I10 Essential (primary) hypertension: Secondary | ICD-10-CM

## 2019-06-02 DIAGNOSIS — G4733 Obstructive sleep apnea (adult) (pediatric): Secondary | ICD-10-CM

## 2019-06-02 NOTE — Patient Instructions (Addendum)
  Visit Information  Goals Addressed            This Visit's Progress   . Pharmacy Care Plan       Current Barriers:  . Chronic Disease Management support, education, and care coordination needs related to Atrial Fibrillation, HTN, HLD, Hypertriglyceridemia, DMII, and CKD  Pharmacist Clinical Goal(s):  Marland Kitchen Patient will focus on improving diet with goal of A1c < 7% . Patient will maintain BP < 140/90 by continued adherence to medications and healthy lifestyle  Interventions: . Comprehensive medication review performed. . Pt will stop drinking Coke and replace with water . Pt to resume CPAP nightly  Patient Self Care Activities:  . Patient verbalizes understanding of plan to stop drinking Coke, Self administers medications as prescribed, Calls pharmacy for medication refills, and Calls provider office for new concerns or questions  Initial goal documentation     . Reduce Coke       Pt will not buy more coke for next 30 days    . Restart CPAP       Pt would like to get back in habit of using CPAP       Mr. Mittleman was given information about Chronic Care Management services today including:  1. CCM service includes personalized support from designated clinical staff supervised by his physician, including individualized plan of care and coordination with other care providers 2. 24/7 contact phone numbers for assistance for urgent and routine care needs. 3. Service will only be billed when office clinical staff spend 20 minutes or more in a month to coordinate care. 4. Only one practitioner may furnish and bill the service in a calendar month. 5. The patient may stop CCM services at any time (effective at the end of the month) by phone call to the office staff. 6. The patient will be responsible for cost sharing (co-pay) of up to 20% of the service fee (after annual deductible is met).  Patient agreed to services and verbal consent obtained.   The patient verbalized understanding  of instructions provided today and declined a print copy of patient instruction materials.  Telephone follow up appointment with pharmacy team member scheduled for: 3 months  Charlene Brooke, PharmD Clinical Pharmacist [facility] at [facility] [phone]

## 2019-06-04 ENCOUNTER — Other Ambulatory Visit: Payer: Self-pay | Admitting: Internal Medicine

## 2019-06-04 DIAGNOSIS — Z79899 Other long term (current) drug therapy: Secondary | ICD-10-CM

## 2019-06-05 ENCOUNTER — Other Ambulatory Visit: Payer: Self-pay | Admitting: Internal Medicine

## 2019-06-05 ENCOUNTER — Ambulatory Visit: Payer: PPO | Attending: Internal Medicine

## 2019-06-05 DIAGNOSIS — E1351 Other specified diabetes mellitus with diabetic peripheral angiopathy without gangrene: Secondary | ICD-10-CM

## 2019-06-05 DIAGNOSIS — I10 Essential (primary) hypertension: Secondary | ICD-10-CM

## 2019-06-05 DIAGNOSIS — IMO0002 Reserved for concepts with insufficient information to code with codable children: Secondary | ICD-10-CM

## 2019-06-05 DIAGNOSIS — Z23 Encounter for immunization: Secondary | ICD-10-CM | POA: Insufficient documentation

## 2019-06-05 DIAGNOSIS — N1831 Chronic kidney disease, stage 3a: Secondary | ICD-10-CM

## 2019-06-05 DIAGNOSIS — E785 Hyperlipidemia, unspecified: Secondary | ICD-10-CM

## 2019-06-05 DIAGNOSIS — F3289 Other specified depressive episodes: Secondary | ICD-10-CM

## 2019-06-05 NOTE — Progress Notes (Signed)
   Covid-19 Vaccination Clinic  Name:  Albert Hawkins    MRN: [id number] DOB: 02/22/1941  06/05/2019  Mr. Arroyave was observed post Covid-19 immunization for 15 minutes without incidence. He was provided with Vaccine Information Sheet and instruction to access the V-Safe system.   Mr. Beevers was instructed to call 911 with any severe reactions post vaccine: Marland Kitchen Difficulty breathing  . Swelling of your face and throat  . A fast heartbeat  . A bad rash all over your body  . Dizziness and weakness    Immunizations Administered    Name Date Dose VIS Date Route   Pfizer COVID-19 Vaccine 06/05/2019  8:57 AM 0.3 mL 04/07/2019 Intramuscular   Manufacturer: [place]   Lot: [id number]   [place]: [id number]

## 2019-06-20 ENCOUNTER — Ambulatory Visit (INDEPENDENT_AMBULATORY_CARE_PROVIDER_SITE_OTHER): Payer: PPO | Admitting: General Practice

## 2019-06-20 ENCOUNTER — Other Ambulatory Visit: Payer: Self-pay

## 2019-06-20 DIAGNOSIS — I4891 Unspecified atrial fibrillation: Secondary | ICD-10-CM

## 2019-06-20 DIAGNOSIS — Z7901 Long term (current) use of anticoagulants: Secondary | ICD-10-CM | POA: Diagnosis not present

## 2019-06-20 LAB — POCT INR: INR: 1.7 — AB (ref 2.0–3.0)

## 2019-06-20 NOTE — Patient Instructions (Signed)
Pre visit review using our clinic review tool, if applicable. No additional management support is needed unless otherwise documented below in the visit note.  Take 1 tablet today (2/23) and then change dosage to 1/2 tablet daily except 1 tablet on Mondays and Thursdays.

## 2019-06-21 NOTE — Progress Notes (Signed)
Agree with management.  Albert Lemmerman J Hellon Vaccarella, MD  

## 2019-06-29 ENCOUNTER — Encounter: Payer: Self-pay | Admitting: Internal Medicine

## 2019-07-13 ENCOUNTER — Other Ambulatory Visit: Payer: Self-pay

## 2019-07-13 ENCOUNTER — Ambulatory Visit (INDEPENDENT_AMBULATORY_CARE_PROVIDER_SITE_OTHER): Payer: PPO | Admitting: General Practice

## 2019-07-13 DIAGNOSIS — Z7901 Long term (current) use of anticoagulants: Secondary | ICD-10-CM

## 2019-07-13 DIAGNOSIS — I4891 Unspecified atrial fibrillation: Secondary | ICD-10-CM

## 2019-07-13 LAB — POCT INR: INR: 2.2 (ref 2.0–3.0)

## 2019-07-13 NOTE — Patient Instructions (Addendum)
Pre visit review using our clinic review tool, if applicable. No additional management support is needed unless otherwise documented below in the visit note.  Continue to take 1/2 tablet daily except 1 tablet on Mondays and Thursdays.  Re-check in 6 weeks.  

## 2019-07-13 NOTE — Progress Notes (Signed)
Agree with management.  Albert Bartunek J Quavis Klutz, MD  

## 2019-08-22 ENCOUNTER — Other Ambulatory Visit: Payer: Self-pay

## 2019-08-22 ENCOUNTER — Ambulatory Visit (INDEPENDENT_AMBULATORY_CARE_PROVIDER_SITE_OTHER): Payer: PPO | Admitting: *Deleted

## 2019-08-22 DIAGNOSIS — I442 Atrioventricular block, complete: Secondary | ICD-10-CM | POA: Diagnosis not present

## 2019-08-22 DIAGNOSIS — Z950 Presence of cardiac pacemaker: Secondary | ICD-10-CM

## 2019-08-22 LAB — CUP PACEART INCLINIC DEVICE CHECK
Battery Impedance: 12800 Ohm
Battery Remaining Longevity: 9 mo
Battery Voltage: 2.71 V
Brady Statistic RV Percent Paced: 85 %
Date Time Interrogation Session: 20210427112127
Implantable Lead Implant Date: 20100216
Implantable Lead Implant Date: 20100216
Implantable Lead Location: 753859
Implantable Lead Location: 753860
Implantable Pulse Generator Implant Date: 20100216
Lead Channel Impedance Value: 331 Ohm
Lead Channel Pacing Threshold Amplitude: 1 V
Lead Channel Pacing Threshold Pulse Width: 0.4 ms
Lead Channel Setting Pacing Amplitude: 2.5 V
Lead Channel Setting Pacing Pulse Width: 0.4 ms
Lead Channel Setting Sensing Sensitivity: 2 mV
Pulse Gen Model: 5826
Pulse Gen Serial Number: 1285538

## 2019-08-22 NOTE — Progress Notes (Signed)
Pacemaker check in clinic. Normal device function. Thresholds, sensing, impedances consistent with previous measurements. Device programmed to maximize longevity. Device programmed at appropriate safety margins. Histogram distribution appropriate for patient activity level. Device programmed to optimize intrinsic conduction. Estimated longevity 0.75 years. Next scheduled in clinic check 6/29 due to battery status. Patient education completed.

## 2019-08-24 ENCOUNTER — Ambulatory Visit (INDEPENDENT_AMBULATORY_CARE_PROVIDER_SITE_OTHER): Payer: PPO | Admitting: General Practice

## 2019-08-24 ENCOUNTER — Other Ambulatory Visit: Payer: Self-pay

## 2019-08-24 ENCOUNTER — Ambulatory Visit (INDEPENDENT_AMBULATORY_CARE_PROVIDER_SITE_OTHER): Payer: PPO

## 2019-08-24 VITALS — BP 140/70 | HR 60 | Temp 98.4°F | Resp 16 | Ht 70.0 in | Wt 200.4 lb

## 2019-08-24 DIAGNOSIS — I4891 Unspecified atrial fibrillation: Secondary | ICD-10-CM | POA: Diagnosis not present

## 2019-08-24 DIAGNOSIS — Z0000 Encounter for general adult medical examination without abnormal findings: Secondary | ICD-10-CM | POA: Diagnosis not present

## 2019-08-24 DIAGNOSIS — Z7901 Long term (current) use of anticoagulants: Secondary | ICD-10-CM | POA: Diagnosis not present

## 2019-08-24 LAB — POCT INR: INR: 2.7 (ref 2.0–3.0)

## 2019-08-24 NOTE — Progress Notes (Signed)
Agree with management.  Urho Rio J Cipriana Biller, MD  

## 2019-08-24 NOTE — Patient Instructions (Addendum)
Albert Hawkins , Thank you for taking time to come for your Medicare Wellness Visit. I appreciate your ongoing commitment to your health goals. Please review the following plan we discussed and let me know if I can assist you in the future.   Screening recommendations/referrals: Colorectal Screening: 08/18/2017; normal results  Vision and Dental Exams: Recommended annual ophthalmology exams for early detection of glaucoma and other disorders of the eye Recommended annual dental exams for proper oral hygiene  Vaccinations: Influenza vaccine: 02/17/2019 Pneumococcal vaccine: completed; Prevnar 12/04/2014, Pneumovax 04/01/2009 Tdap vaccine: Declined. Please call your insurance company to determine your out of pocket expense. You may also receive this vaccine at your local pharmacy or Health Dept. Shingles vaccine: Please call your insurance company to determine your out of pocket expense for the Shingrix vaccine. You may receive this vaccine at your local pharmacy. Covid vaccine: completed; [place] 05/16/2019, 06/05/2019  Advanced directives: Advance directives discussed with you today. Please bring a copy of your POA (Power of [place]) and/or Living Will to your next appointment.  Goals:  Recommend to drink at least 6-8 8oz glasses of water per day.  Recommend to exercise for at least 150 minutes per week.  Recommend to remove any items from the home that may cause slips or trips.  Recommend to decrease portion sizes by eating 3 small healthy meals and at least 2 healthy snacks per day.  Recommend to begin DASH diet as directed below  Recommend to continue efforts to reduce smoking habits until no longer smoking. Smoking Cessation literature is attached below.  Next appointment: Please schedule your Annual Wellness Visit with your Nurse Health Advisor in one year.  Preventive Care 77 Years and Older, Male Preventive care refers to lifestyle choices and visits with your health care provider that  can promote health and wellness. What does preventive care include?  A yearly physical exam. This is also called an annual well check.  Dental exams once or twice a year.  Routine eye exams. Ask your health care provider how often you should have your eyes checked.  Personal lifestyle choices, including:  Daily care of your teeth and gums.  Regular physical activity.  Eating a healthy diet.  Avoiding tobacco and drug use.  Limiting alcohol use.  Practicing safe sex.  Taking low doses of aspirin every day if recommended by your health care provider..  Taking vitamin and mineral supplements as recommended by your health care provider. What happens during an annual well check? The services and screenings done by your health care provider during your annual well check will depend on your age, overall health, lifestyle risk factors, and family history of disease. Counseling  Your health care provider may ask you questions about your:  Alcohol use.  Tobacco use.  Drug use.  Emotional well-being.  Home and relationship well-being.  Sexual activity.  Eating habits.  History of falls.  Memory and ability to understand (cognition).  Work and work Statistician. Screening  You may have the following tests or measurements:  Height, weight, and BMI.  Blood pressure.  Lipid and cholesterol levels. These may be checked every 5 years, or more frequently if you are over 64 years old.  Skin check.  Lung cancer screening. You may have this screening every year starting at age 16 if you have a 30-pack-year history of smoking and currently smoke or have quit within the past 15 years.  Fecal occult blood test (FOBT) of the stool. You may have this test every  year starting at age 94.  Flexible sigmoidoscopy or colonoscopy. You may have a sigmoidoscopy every 5 years or a colonoscopy every 10 years starting at age 18.  Prostate cancer screening. Recommendations will vary  depending on your family history and other risks.  Hepatitis C blood test.  Hepatitis B blood test.  Sexually transmitted disease (STD) testing.  Diabetes screening. This is done by checking your blood sugar (glucose) after you have not eaten for a while (fasting). You may have this done every 1-3 years.  Abdominal aortic aneurysm (AAA) screening. You may need this if you are a current or former smoker.  Osteoporosis. You may be screened starting at age 67 if you are at high risk. Talk with your health care provider about your test results, treatment options, and if necessary, the need for more tests. Vaccines  Your health care provider may recommend certain vaccines, such as:  Influenza vaccine. This is recommended every year.  Tetanus, diphtheria, and acellular pertussis (Tdap, Td) vaccine. You may need a Td booster every 10 years.  Zoster vaccine. You may need this after age 46.  Pneumococcal 13-valent conjugate (PCV13) vaccine. One dose is recommended after age 26.  Pneumococcal polysaccharide (PPSV23) vaccine. One dose is recommended after age 35. Talk to your health care provider about which screenings and vaccines you need and how often you need them. This information is not intended to replace advice given to you by your health care provider. Make sure you discuss any questions you have with your health care provider. Document Released: 05/10/2015 Document Revised: 01/01/2016 Document Reviewed: 02/12/2015 Elsevier Interactive Patient Education  2017 [place] Prevention in the Home Falls can cause injuries. They can happen to people of all ages. There are many things you can do to make your home safe and to help prevent falls. What can I do on the outside of my home?  Regularly fix the edges of walkways and driveways and fix any cracks.  Remove anything that might make you trip as you walk through a door, such as a raised step or threshold.  Trim any bushes  or trees on the path to your home.  Use bright outdoor lighting.  Clear any walking paths of anything that might make someone trip, such as rocks or tools.  Regularly check to see if handrails are loose or broken. Make sure that both sides of any steps have handrails.  Any raised decks and porches should have guardrails on the edges.  Have any leaves, snow, or ice cleared regularly.  Use sand or salt on walking paths during winter.  Clean up any spills in your garage right away. This includes oil or grease spills. What can I do in the bathroom?  Use night lights.  Install grab bars by the toilet and in the tub and shower. Do not use towel bars as grab bars.  Use non-skid mats or decals in the tub or shower.  If you need to sit down in the shower, use a plastic, non-slip stool.  Keep the floor dry. Clean up any water that spills on the floor as soon as it happens.  Remove soap buildup in the tub or shower regularly.  Attach bath mats securely with double-sided non-slip rug tape.  Do not have throw rugs and other things on the floor that can make you trip. What can I do in the bedroom?  Use night lights.  Make sure that you have a light by your bed that  is easy to reach.  Do not use any sheets or blankets that are too big for your bed. They should not hang down onto the floor.  Have a firm chair that has side arms. You can use this for support while you get dressed.  Do not have throw rugs and other things on the floor that can make you trip. What can I do in the kitchen?  Clean up any spills right away.  Avoid walking on wet floors.  Keep items that you use a lot in easy-to-reach places.  If you need to reach something above you, use a strong step stool that has a grab bar.  Keep electrical cords out of the way.  Do not use floor polish or wax that makes floors slippery. If you must use wax, use non-skid floor wax.  Do not have throw rugs and other things on  the floor that can make you trip. What can I do with my stairs?  Do not leave any items on the stairs.  Make sure that there are handrails on both sides of the stairs and use them. Fix handrails that are broken or loose. Make sure that handrails are as long as the stairways.  Check any carpeting to make sure that it is firmly attached to the stairs. Fix any carpet that is loose or worn.  Avoid having throw rugs at the top or bottom of the stairs. If you do have throw rugs, attach them to the floor with carpet tape.  Make sure that you have a light switch at the top of the stairs and the bottom of the stairs. If you do not have them, ask someone to add them for you. What else can I do to help prevent falls?  Wear shoes that:  Do not have high heels.  Have rubber bottoms.  Are comfortable and fit you well.  Are closed at the toe. Do not wear sandals.  If you use a stepladder:  Make sure that it is fully opened. Do not climb a closed stepladder.  Make sure that both sides of the stepladder are locked into place.  Ask someone to hold it for you, if possible.  Clearly mark and make sure that you can see:  Any grab bars or handrails.  First and last steps.  Where the edge of each step is.  Use tools that help you move around (mobility aids) if they are needed. These include:  Canes.  Walkers.  Scooters.  Crutches.  Turn on the lights when you go into a dark area. Replace any light bulbs as soon as they burn out.  Set up your furniture so you have a clear path. Avoid moving your furniture around.  If any of your floors are uneven, fix them.  If there are any pets around you, be aware of where they are.  Review your medicines with your doctor. Some medicines can make you feel dizzy. This can increase your chance of falling. Ask your doctor what other things that you can do to help prevent falls. This information is not intended to replace advice given to you by  your health care provider. Make sure you discuss any questions you have with your health care provider. Document Released: 02/07/2009 Document Revised: 09/19/2015 Document Reviewed: 05/18/2014 Elsevier Interactive Patient Education  2017 [employer].

## 2019-08-24 NOTE — Progress Notes (Signed)
Subjective:   Albert Hawkins is a 79 y.o. male who presents for Medicare Annual/Subsequent preventive examination.  Review of Systems:  No ROS Medicare Wellness Visit Cardiac Risk Factors include: advanced age (>37men, >58 women);dyslipidemia;hypertension;male gender Sleep Patterns: No issues with falling sleep; feels rested on waking after 6-7 hours of sleep; gets up frequently to void. Home Safety/Smoke Alarms: Feels safe in home; Smoke alarms in place. Living environment: Lives with wife of 52 years in [place]. Seat Belt Safety/Bike Helmet: Wears seat belt.    Objective:    Vitals: BP 140/70 (BP Location: Right Arm, Patient Position: Sitting, Cuff Size: Normal)   Pulse 60   Temp 98.4 F (36.9 C)   Resp 16   Ht 5\' 10"  (1.778 m)   Wt 200 lb 6.4 oz (90.9 kg)   SpO2 98%   BMI 28.75 kg/m   Body mass index is 28.75 kg/m.  Advanced Directives 08/24/2019  Does Patient Have a Medical Advance Directive? Yes  Type of Advance Directive Living will;Healthcare Power of Attorney  Does patient want to make changes to medical advance directive? No - Patient declined  Copy of [place] in Chart? No - copy requested    Tobacco Social History   Tobacco Use  Smoking Status Former Smoker  . Packs/day: 2.00  . Years: 10.00  . Pack years: 20.00  . Types: Cigarettes  . Quit date: 04/27/1985  . Years since quitting: 34.3  Smokeless Tobacco Never Used  Tobacco Comment   smoked age [age], maximum regular consumption up to 1 ppd     Counseling given: No Comment: smoked age [age], maximum regular consumption up to 1 ppd   Clinical Intake:  Pre-visit preparation completed: Yes  Pain : No/denies pain Pain Score: 0-No pain     BMI - recorded: 28.8 Nutritional Status: BMI 25 -29 Overweight Nutritional Risks: None Diabetes: No  How often do you need to have someone help you when you read instructions, pamphlets, or other written  materials from your doctor or pharmacy?: 1 - Never  Interpreter Needed?: No  Comments: [name], LPN  Past Medical History:  Diagnosis Date  . Atrial fibrillation (HCC)    paroxysmal, on coumadin  . Benign essential HTN   . Diabetes mellitus type II   . Hearing loss, conductive, bilateral   . Hyperlipidemia   . 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 Mobitz (type) II atrioventricular block    S/P  PPM (SJM) by Dr Rayann Heman  . Neoplasm of uncertain behavior of skin   . PVC's (premature ventricular contractions)   . 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 Sleep disorder    Past Surgical History:  Procedure Laterality Date  . 3 fatty tumors     benign  . HAND SURGERY     Right thumb amputated and reattached   . HERNIA REPAIR     Umbilical  . no colonoscopy     "did not want one" (warfarin goes against it; 11/09/13)  . PACEMAKER PLACEMENT     06/12/2008, Dr. Thompson Grayer  . SKIN CANCER EXCISION     above nose    Family History  Problem Relation Age of Onset  . Heart attack Paternal Grandfather 89  . Diabetes Paternal Uncle   . Diabetes Maternal Grandfather   . Skin cancer Father   . Emphysema  Father   . Coronary artery disease Father        stent  . Diabetes Maternal Aunt   . Diabetes Maternal Uncle   . Stroke Neg Hx    Social History   Socioeconomic History  . Marital status: Married    Spouse name: Not on file  . Number of children: Not on file  . Years of education: Not on file  . Highest education level: Not on file  Occupational History  . Not on file  Tobacco Use  . Smoking status: Former Smoker    Packs/day: 2.00    Years: 10.00    Pack years: 20.00    Types: Cigarettes    Quit date: 04/27/1985    Years since quitting: 34.3  . Smokeless tobacco: Never Used  . Tobacco comment: smoked age [age], maximum regular consumption up to 1 ppd  Substance and Sexual Activity  . Alcohol use: No    . Drug use: No  . Sexual activity: Not on file  Other Topics Concern  . Not on file  Social History Narrative  . Not on file   Social Determinants of Health   Financial Resource Strain:   . Difficulty of Paying Living Expenses:   Food Insecurity:   . Worried About Charity fundraiser in the Last Year:   . Arboriculturist in the Last Year:   Transportation Needs:   . Film/video editor (Medical):   Marland Kitchen Lack of Transportation (Non-Medical):   Physical Activity:   . Days of Exercise per Week:   . Minutes of Exercise per Session:   Stress:   . Feeling of Stress :   Social Connections:   . Frequency of Communication with Friends and Family:   . Frequency of Social Gatherings with Friends and Family:   . Attends Religious Services:   . Active Member of Clubs or Organizations:   . Attends Archivist Meetings:   Marland Kitchen Marital Status:     Outpatient Encounter Medications as of 08/24/2019  Medication Sig  . acetaminophen (TYLENOL ARTHRITIS PAIN) 650 MG CR tablet Take 650 mg by mouth at bedtime. arthritis  . cetirizine (ZYRTEC) 10 MG tablet Take 10 mg by mouth daily.   Marland Kitchen FLUoxetine (PROZAC) 10 MG capsule TAKE (1) CAPSULE DAILY.  Marland Kitchen glimepiride (AMARYL) 4 MG tablet Take 1 tablet (4 mg total) by mouth daily before breakfast.  . glucose blood (ONE TOUCH ULTRA TEST) test strip Check twice daily as instructed.  Dx diabetes with hyperglycemia  . hydrochlorothiazide (MICROZIDE) 12.5 MG capsule TAKE (1) CAPSULE DAILY.  Marland Kitchen losartan (COZAAR) 100 MG tablet TAKE 1 TABLET ONCE DAILY.  . metFORMIN (GLUCOPHAGE) 500 MG tablet TAKE 2 TABLETS TWICE DAILY WITH MEALS.  . Multiple Vitamin (MULTIVITAMIN) tablet Take 1 tablet by mouth daily.    . pravastatin (PRAVACHOL) 20 MG tablet TAKE 1 TABLET ONCE DAILY.  Marland Kitchen warfarin (COUMADIN) 2 MG tablet Take 1/2 tablet daily except 1 tablet on Monday or Take as directed by anticoagulation clinic  90 day   No facility-administered encounter medications on file  as of 08/24/2019.    Activities of Daily Living In your present state of health, do you have any difficulty performing the following activities: 08/24/2019  Hearing? N  Vision? N  Difficulty concentrating or making decisions? N  Walking or climbing stairs? N  Dressing or bathing? N  Doing errands, shopping? N  Using the Toilet? N  In the past six months, have you  accidently leaked urine? N  Do you have problems with loss of bowel control? N  Managing your Medications? N  Managing your Finances? N  Housekeeping or managing your Housekeeping? N  Some recent data might be hidden    Patient Care Team: Binnie Rail, MD as PCP - General (Internal Medicine) Charlton Haws, [facility] (Pharmacist)   Assessment:   This is a routine wellness examination for Davidanthony.  Exercise Activities and Dietary recommendations Current Exercise Habits: The patient does not participate in regular exercise at present(enjoys golfing), Exercise limited by: cardiac condition(s)  Goals    . Cut out extra servings (pt-stated)     To lose a couple of pounds; would like to be at 190.    Marland Kitchen Pharmacy Care Plan     Current Barriers:  . Chronic Disease Management support, education, and care coordination needs related to Atrial Fibrillation, HTN, HLD, Hypertriglyceridemia, DMII, and CKD  Pharmacist Clinical Goal(s):  Marland Kitchen Patient will focus on improving diet with goal of A1c < 7% . Patient will maintain BP < 140/90 by continued adherence to medications and healthy lifestyle  Interventions: . Comprehensive medication review performed. . Pt will stop drinking Coke and replace with water . Pt to resume CPAP nightly  Patient Self Care Activities:  . Patient verbalizes understanding of plan to stop drinking Coke, Self administers medications as prescribed, Calls pharmacy for medication refills, and Calls provider office for new concerns or questions  Initial goal documentation     . Reduce Coke     Pt will not  buy more coke for next 30 days    . Restart CPAP     Pt would like to get back in habit of using CPAP       Fall Risk Fall Risk  08/24/2019 05/08/2019 01/30/2016 10/06/2012  Falls in the past year? 0 0 No No  Number falls in past yr: 0 0 - -  Injury with Fall? 0 - - -  Risk for fall due to : No Fall Risks - - -   Is the patient's home free of loose throw rugs in walkways, pet beds, electrical cords, etc?   yes      Grab bars in the bathroom? yes      Handrails on the stairs?   yes      Adequate lighting?   yes  Depression Screen PHQ 2/9 Scores 08/24/2019 05/08/2019 01/30/2016 10/06/2012  PHQ - 2 Score 0 0 1 0  PHQ- 9 Score - 2 - -          Immunization History  Administered Date(s) Administered  . Fluad Quad(high Dose 65+) 02/17/2019  . Influenza Split 02/12/2011, 02/02/2012  . Influenza Whole 02/25/2006, 04/14/2007, 02/16/2008, 02/18/2009, 02/03/2010  . Influenza, High Dose Seasonal PF 02/02/2014, 01/22/2015, 01/24/2016, 01/15/2017, 01/25/2018  . Influenza-Unspecified 01/25/2013  . PFIZER SARS-COV-2 Vaccination 05/16/2019, 06/05/2019  . Pneumococcal Conjugate-13 12/04/2014  . Pneumococcal Polysaccharide-23 04/01/2009    Qualifies for Shingles Vaccine? Will check with local pharmacy.  Screening Tests Health Maintenance  Topic Date Due  . TETANUS/TDAP  Never done  . FOOT EXAM  11/03/2019  . HEMOGLOBIN A1C  11/05/2019  . INFLUENZA VACCINE  11/26/2019  . OPHTHALMOLOGY EXAM  01/25/2020  . COVID-19 Vaccine  Completed  . PNA vac Low Risk Adult  Completed   Cancer Screenings: Lung: Low Dose CT Chest recommended if Age 61-80 years, 30 pack-year currently smoking OR have quit w/in 15years. Patient does not qualify. Colorectal: Yes  Plan:     Reviewed health maintenance screenings with patient today and relevant education, vaccines, and/or referrals were provided.    Continue doing brain stimulating activities (puzzles, reading, adult coloring books, staying active)  to keep memory sharp.    Continue to eat heart healthy diet (full of fruits, vegetables, whole grains, lean protein, water--limit salt, fat, and sugar intake) and increase physical activity as tolerated.   I have personally reviewed and noted the following in the patient's chart:   . Medical and social history . Use of alcohol, tobacco or illicit drugs  . Current medications and supplements . Functional ability and status . Nutritional status . Physical activity . Advanced directives . List of other physicians . Hospitalizations, surgeries, and ER visits in previous 12 months . Vitals . Screenings to include cognitive, depression, and falls . Referrals and appointments  In addition, I have reviewed and discussed with patient certain preventive protocols, quality metrics, and best practice recommendations. A written personalized care plan for preventive services as well as general preventive health recommendations were provided to patient.     Sheral Flow, LPN  [id number]  Nurse Health Advisor

## 2019-08-24 NOTE — Patient Instructions (Signed)
Pre visit review using our clinic review tool, if applicable. No additional management support is needed unless otherwise documented below in the visit note.  Continue to take 1/2 tablet daily except 1 tablet on Mondays and Thursdays.  Re-check in 6 weeks.  

## 2019-09-01 ENCOUNTER — Other Ambulatory Visit: Payer: Self-pay | Admitting: Internal Medicine

## 2019-09-06 ENCOUNTER — Other Ambulatory Visit: Payer: Self-pay | Admitting: Internal Medicine

## 2019-09-06 DIAGNOSIS — L821 Other seborrheic keratosis: Secondary | ICD-10-CM | POA: Diagnosis not present

## 2019-09-06 DIAGNOSIS — Z85828 Personal history of other malignant neoplasm of skin: Secondary | ICD-10-CM | POA: Diagnosis not present

## 2019-09-06 DIAGNOSIS — L578 Other skin changes due to chronic exposure to nonionizing radiation: Secondary | ICD-10-CM | POA: Diagnosis not present

## 2019-09-06 DIAGNOSIS — L570 Actinic keratosis: Secondary | ICD-10-CM | POA: Diagnosis not present

## 2019-09-06 DIAGNOSIS — D485 Neoplasm of uncertain behavior of skin: Secondary | ICD-10-CM | POA: Diagnosis not present

## 2019-09-06 DIAGNOSIS — Z7901 Long term (current) use of anticoagulants: Secondary | ICD-10-CM

## 2019-09-06 DIAGNOSIS — L814 Other melanin hyperpigmentation: Secondary | ICD-10-CM | POA: Diagnosis not present

## 2019-09-06 DIAGNOSIS — D225 Melanocytic nevi of trunk: Secondary | ICD-10-CM | POA: Diagnosis not present

## 2019-09-06 DIAGNOSIS — C44219 Basal cell carcinoma of skin of left ear and external auricular canal: Secondary | ICD-10-CM | POA: Diagnosis not present

## 2019-09-06 NOTE — Telephone Encounter (Signed)
Filling in for [facility] while she is out this afternoon.  Pt has been compliant with coumadin management.  Sent in script

## 2019-10-05 ENCOUNTER — Ambulatory Visit (INDEPENDENT_AMBULATORY_CARE_PROVIDER_SITE_OTHER): Payer: PPO | Admitting: General Practice

## 2019-10-05 ENCOUNTER — Other Ambulatory Visit: Payer: Self-pay

## 2019-10-05 DIAGNOSIS — Z7901 Long term (current) use of anticoagulants: Secondary | ICD-10-CM | POA: Diagnosis not present

## 2019-10-05 DIAGNOSIS — I4891 Unspecified atrial fibrillation: Secondary | ICD-10-CM

## 2019-10-05 LAB — POCT INR: INR: 3.1 — AB (ref 2.0–3.0)

## 2019-10-05 NOTE — Progress Notes (Signed)
Agree with management.  [place] Valli J Jaz Laningham, MD  

## 2019-10-05 NOTE — Patient Instructions (Signed)
Pre visit review using our clinic review tool, if applicable. No additional management support is needed unless otherwise documented below in the visit note.  Skip today and then continue to take 1/2 tablet daily except 1 tablet on Mondays and Thursdays.  Re-check in 6 weeks.

## 2019-10-10 DIAGNOSIS — C44219 Basal cell carcinoma of skin of left ear and external auricular canal: Secondary | ICD-10-CM | POA: Diagnosis not present

## 2019-10-24 ENCOUNTER — Ambulatory Visit (INDEPENDENT_AMBULATORY_CARE_PROVIDER_SITE_OTHER): Payer: PPO | Admitting: Emergency Medicine

## 2019-10-24 ENCOUNTER — Other Ambulatory Visit: Payer: Self-pay

## 2019-10-24 DIAGNOSIS — I442 Atrioventricular block, complete: Secondary | ICD-10-CM

## 2019-10-24 DIAGNOSIS — Z950 Presence of cardiac pacemaker: Secondary | ICD-10-CM

## 2019-10-24 DIAGNOSIS — I4821 Permanent atrial fibrillation: Secondary | ICD-10-CM

## 2019-10-24 LAB — CUP PACEART INCLINIC DEVICE CHECK
Battery Impedance: 16500 Ohm
Battery Remaining Longevity: 6 mo
Battery Voltage: 2.65 V
Brady Statistic RV Percent Paced: 84 %
Date Time Interrogation Session: 20210629145237
Implantable Lead Implant Date: 20100216
Implantable Lead Implant Date: 20100216
Implantable Lead Location: 753859
Implantable Lead Location: 753860
Implantable Pulse Generator Implant Date: 20100216
Lead Channel Impedance Value: 286 Ohm
Lead Channel Setting Pacing Amplitude: 2.5 V
Lead Channel Setting Pacing Pulse Width: 0.4 ms
Lead Channel Setting Sensing Sensitivity: 2 mV
Pulse Gen Model: 5826
Pulse Gen Serial Number: 1285538

## 2019-10-24 NOTE — Progress Notes (Signed)
Pacemaker battery check in clinic. No lead testing performed. Histogram distribution appropriate for patient activity level. Device programmed to optimize intrinsic conduction. Estimated longevity 0.5 years (battery impedance 16.5 kohms). Patient education completed. ROV with Device Clinic on 12/12/19 for battery check.

## 2019-11-05 NOTE — Patient Instructions (Addendum)
  Blood work was ordered.     Medications reviewed and updated.  Changes include :   Start Rybelsus and hold the glimepiride.  If you tolerate this medication we will increase the dose after one month.    Your prescription(s) have been submitted to your pharmacy. Please take as directed and contact our office if you believe you are having problem(s) with the medication(s).     Please followup in 6 months

## 2019-11-05 NOTE — Progress Notes (Addendum)
Subjective:    Patient ID: Albert Hawkins, male    DOB: 1940/05/09, 79 y.o.   MRN: [id number]  HPI The patient is here for follow up of their chronic medical problems, including DM, Afib, hypertension, hyperlipidemia, CKD, depression  He is taking all of his medications as prescribed.    He is not exercising regularly.     He has felt good but a little sluggish.  He wonders if this is related to the glimepiride and since increasing it dropping his sugars a little bit.  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  He has had some intermittent electrical-like pain that is very transient in that area.  He did have this prior but it was flared after the the skin cancer was     Medications and allergies reviewed with patient and updated if appropriate.  Patient Active Problem List   Diagnosis Date Noted  . Reactive airway disease 12/29/2017  . Urinary frequency 12/16/2017  . CKD (chronic kidney disease) stage 3, GFR 30-59 ml/min 07/23/2017  . Long term (current) use of anticoagulants 01/15/2017  . Obesity (BMI 30-39.9) 11/09/2013  . Encounter for therapeutic drug monitoring 06/06/2013  . Complete heart block ([place]) 03/03/2013  . OSA (obstructive sleep apnea) 03/28/2012  . 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 Periodic limb movement disorder (PLMD) 12/25/2011  . Depression 01/13/2010  . LUMBAR RADICULOPATHY 07/20/2008  . Second degree AV block, Mobitz type II 07/18/2008  . Essential hypertension 01/16/2008  . 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 Atrial fibrillation ([place]) 11/17/2007  . HEARING LOSS, CONDUCTIVE, BILATERAL 04/14/2007  . DM (diabetes mellitus), secondary, uncontrolled, with peripheral vascular complications ([place]) 16/96/7893  . Hyperlipidemia 03/17/2007  . 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 SKIN CANCER, HX OF 05/17/2006    Current Outpatient Medications on File Prior to Visit  Medication Sig Dispense Refill  . acetaminophen  (TYLENOL ARTHRITIS PAIN) 650 MG CR tablet Take 650 mg by mouth at bedtime. arthritis    . cetirizine (ZYRTEC) 10 MG tablet Take 10 mg by mouth daily.     Marland Kitchen FLUoxetine (PROZAC) 10 MG capsule TAKE (1) CAPSULE DAILY. 90 capsule 1  . glimepiride (AMARYL) 4 MG tablet TAKE 1 TABLET ONCE DAILY BEFORE BREAKFAST. 90 tablet 0  . glucose blood (ONE TOUCH ULTRA TEST) test strip Check twice daily as instructed.  Dx diabetes with hyperglycemia 180 each 3  . hydrochlorothiazide (MICROZIDE) 12.5 MG capsule TAKE (1) CAPSULE DAILY. 90 capsule 1  . losartan (COZAAR) 100 MG tablet TAKE 1 TABLET ONCE DAILY. 90 tablet 1  . metFORMIN (GLUCOPHAGE) 500 MG tablet TAKE 2 TABLETS TWICE DAILY WITH MEALS. 360 tablet 1  . Multiple Vitamin (MULTIVITAMIN) tablet Take 1 tablet by mouth daily.      . pravastatin (PRAVACHOL) 20 MG tablet TAKE 1 TABLET ONCE DAILY. 90 tablet 1  . warfarin (COUMADIN) 2 MG tablet TAKE 1/2 TABLET DAILY EXCEPT TAKE 1 TABLET ON MONDAY OR TAKE AS DIRECTED. 75 tablet 0   No current facility-administered medications on file prior to visit.    Past Medical History:  Diagnosis Date  . Atrial fibrillation (HCC)    paroxysmal, on coumadin  . Benign essential HTN   . Diabetes mellitus type II   . Hearing loss, conductive, bilateral   . Hyperlipidemia   . 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 Mobitz (type) II atrioventricular block    S/P  PPM (SJM) by Dr  Allred  . Neoplasm of uncertain behavior of skin   . PVC's (premature ventricular contractions)   . 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 Sleep disorder     Past Surgical History:  Procedure Laterality Date  . 3 fatty tumors     benign  . HAND SURGERY     Right thumb amputated and reattached   . HERNIA REPAIR     Umbilical  . no colonoscopy     "did not want one" (warfarin goes against it; 11/09/13)  . PACEMAKER PLACEMENT     06/12/2008, Dr. Thompson Grayer  . SKIN CANCER EXCISION       above nose     Social History   Socioeconomic History  . Marital status: Married    Spouse name: Not on file  . Number of children: Not on file  . Years of education: Not on file  . Highest education level: Not on file  Occupational History  . Not on file  Tobacco Use  . Smoking status: Former Smoker    Packs/day: 2.00    Years: 10.00    Pack years: 20.00    Types: Cigarettes    Quit date: 04/27/1985    Years since quitting: 34.5  . Smokeless tobacco: Never Used  . Tobacco comment: smoked age [age], maximum regular consumption up to 1 ppd  Vaping Use  . Vaping Use: Never used  Substance and Sexual Activity  . Alcohol use: No  . Drug use: No  . Sexual activity: Not on file  Other Topics Concern  . Not on file  Social History Narrative  . Not on file   Social Determinants of Health   Financial Resource Strain:   . Difficulty of Paying Living Expenses:   Food Insecurity:   . Worried About Charity fundraiser in the Last Year:   . Arboriculturist in the Last Year:   Transportation Needs:   . Film/video editor (Medical):   Marland Kitchen Lack of Transportation (Non-Medical):   Physical Activity:   . Days of Exercise per Week:   . Minutes of Exercise per Session:   Stress:   . Feeling of Stress :   Social Connections:   . Frequency of Communication with Friends and Family:   . Frequency of Social Gatherings with Friends and Family:   . Attends Religious Services:   . Active Member of Clubs or Organizations:   . Attends Archivist Meetings:   Marland Kitchen Marital Status:     Family History  Problem Relation Age of Onset  . Heart attack Paternal Grandfather 89  . Diabetes Paternal Uncle   . Diabetes Maternal Grandfather   . Skin cancer Father   . Emphysema Father   . Coronary artery disease Father        stent  . Diabetes Maternal Aunt   . Diabetes Maternal Uncle   . Stroke Neg Hx     Review of Systems  Constitutional: Negative for fever.  Respiratory:  Negative for cough, shortness of breath and wheezing.   Cardiovascular: Negative for chest pain, palpitations and leg swelling.  Neurological: Negative for light-headedness and headaches.       Objective:   Vitals:   11/06/19 1107  BP: 126/62  Pulse: 70  Temp: 98 F (36.7 C)  SpO2: 97%   BP Readings from Last 3 Encounters:  11/06/19 126/62  08/24/19 140/70  05/25/19 120/62  Wt Readings from Last 3 Encounters:  11/06/19 195 lb (88.5 kg)  08/24/19 200 lb 6.4 oz (90.9 kg)  05/25/19 200 lb (90.7 kg)   Body mass index is 27.98 kg/m.   Physical Exam    Constitutional: Appears well-developed and well-nourished. No distress.  HENT:  Head: Normocephalic and atraumatic.  Neck: Neck supple. No tracheal deviation present. No thyromegaly present.  No cervical lymphadenopathy Cardiovascular: Normal rate, regular rhythm and normal heart sounds.  No murmur heard. No carotid bruit .  Trace b/l LE edema Pulmonary/Chest: Effort normal and breath sounds normal. No respiratory distress. No has no wheezes. No rales.  Skin: Skin is warm and dry. Not diaphoretic.  Psychiatric: Normal mood and affect. Behavior is normal.    Diabetic Foot Exam - Simple   Simple Foot Form Diabetic Foot exam was performed with the following findings: Yes   Visual Inspection No deformities, no ulcerations, no other skin breakdown bilaterally: Yes Sensation Testing Intact to touch and monofilament testing bilaterally: Yes Pulse Check Posterior Tibialis and Dorsalis pulse intact bilaterally: Yes Comments      Assessment & Plan:    See Problem List for Assessment and Plan of chronic medical problems.    This visit occurred during the SARS-CoV-2 public health emergency.  Safety protocols were in place, including screening questions prior to the visit, additional usage of staff PPE, and extensive cleaning of exam room while observing appropriate contact time as indicated for disinfecting solutions.

## 2019-11-06 ENCOUNTER — Ambulatory Visit (INDEPENDENT_AMBULATORY_CARE_PROVIDER_SITE_OTHER): Payer: PPO | Admitting: Internal Medicine

## 2019-11-06 ENCOUNTER — Encounter: Payer: Self-pay | Admitting: Internal Medicine

## 2019-11-06 ENCOUNTER — Other Ambulatory Visit: Payer: Self-pay

## 2019-11-06 VITALS — BP 126/62 | HR 70 | Temp 98.0°F | Ht 70.0 in | Wt 195.0 lb

## 2019-11-06 DIAGNOSIS — F3289 Other specified depressive episodes: Secondary | ICD-10-CM

## 2019-11-06 DIAGNOSIS — E1365 Other specified diabetes mellitus with hyperglycemia: Secondary | ICD-10-CM | POA: Diagnosis not present

## 2019-11-06 DIAGNOSIS — IMO0002 Reserved for concepts with insufficient information to code with codable children: Secondary | ICD-10-CM

## 2019-11-06 DIAGNOSIS — E7849 Other hyperlipidemia: Secondary | ICD-10-CM

## 2019-11-06 DIAGNOSIS — I4891 Unspecified atrial fibrillation: Secondary | ICD-10-CM

## 2019-11-06 DIAGNOSIS — N1831 Chronic kidney disease, stage 3a: Secondary | ICD-10-CM | POA: Diagnosis not present

## 2019-11-06 DIAGNOSIS — I10 Essential (primary) hypertension: Secondary | ICD-10-CM

## 2019-11-06 DIAGNOSIS — E1351 Other specified diabetes mellitus with diabetic peripheral angiopathy without gangrene: Secondary | ICD-10-CM | POA: Diagnosis not present

## 2019-11-06 MED ORDER — RYBELSUS 3 MG PO TABS: 3.0000 mg | ORAL_TABLET | Freq: Every day | ORAL | 0 refills | Status: DC

## 2019-11-06 NOTE — Assessment & Plan Note (Signed)
Chronic Not controlled Check a1c Will change glimepiride to rybelsus and titrate up if tolerated Continue metformin F/u in 6 months

## 2019-11-06 NOTE — Assessment & Plan Note (Signed)
Chronic cmp 

## 2019-11-06 NOTE — Assessment & Plan Note (Signed)
Chronic Check lipid panel  Continue daily statin Regular exercise and healthy diet encouraged  

## 2019-11-06 NOTE — Addendum Note (Signed)
Addended by: Binnie Rail on: 11/06/2019 02:30 PM   Modules accepted: Orders

## 2019-11-06 NOTE — Assessment & Plan Note (Signed)
Chronic following with Dr Rayann Heman Cmp, cbc

## 2019-11-06 NOTE — Assessment & Plan Note (Signed)
Chronic BP well controlled Current regimen effective and well tolerated Continue current medications at current doses cmp  

## 2019-11-06 NOTE — Assessment & Plan Note (Signed)
Chronic Controlled, stable Continue current dose of medication Fluoxetine 10 mg daily

## 2019-11-07 LAB — COMPREHENSIVE METABOLIC PANEL
AG Ratio: 1.9 (calc) (ref 1.0–2.5)
ALT: 20 U/L (ref 9–46)
AST: 21 U/L (ref 10–35)
Albumin: 4.3 g/dL (ref 3.6–5.1)
Alkaline phosphatase (APISO): 50 U/L (ref 35–144)
BUN/Creatinine Ratio: 16 (calc) (ref 6–22)
BUN: 21 mg/dL (ref 7–25)
CO2: 28 mmol/L (ref 20–32)
Calcium: 10.1 mg/dL (ref 8.6–10.3)
Chloride: 97 mmol/L — ABNORMAL LOW (ref 98–110)
Creat: 1.33 mg/dL — ABNORMAL HIGH (ref 0.70–1.18)
Globulin: 2.3 g/dL (calc) (ref 1.9–3.7)
Glucose, Bld: 195 mg/dL — ABNORMAL HIGH (ref 65–99)
Potassium: 4.7 mmol/L (ref 3.5–5.3)
Sodium: 137 mmol/L (ref 135–146)
Total Bilirubin: 0.8 mg/dL (ref 0.2–1.2)
Total Protein: 6.6 g/dL (ref 6.1–8.1)

## 2019-11-07 LAB — HEMOGLOBIN A1C
Hgb A1c MFr Bld: 7.7 % of total Hgb — ABNORMAL HIGH (ref <5.7)
Mean Plasma Glucose: 174 (calc)
eAG (mmol/L): 9.7 (calc)

## 2019-11-07 LAB — LIPID PANEL
Cholesterol: 172 mg/dL (ref <200)
HDL: 43 mg/dL (ref >=40)
LDL Cholesterol (Calc): 84 mg/dL (calc)
Non-HDL Cholesterol (Calc): 129 mg/dL (calc) (ref <130)
Total CHOL/HDL Ratio: 4 (calc) (ref <5.0)
Triglycerides: 371 mg/dL — ABNORMAL HIGH (ref <150)

## 2019-11-07 LAB — CBC WITH DIFFERENTIAL/PLATELET
Absolute Monocytes: 757 cells/uL (ref 200–950)
Basophils Absolute: 52 cells/uL (ref 0–200)
Basophils Relative: 0.6 %
Eosinophils Absolute: 189 cells/uL (ref 15–500)
Eosinophils Relative: 2.2 %
HCT: 47.8 % (ref 38.5–50.0)
Hemoglobin: 15.5 g/dL (ref 13.2–17.1)
Lymphs Abs: 2038 cells/uL (ref 850–3900)
MCH: 28.9 pg (ref 27.0–33.0)
MCHC: 32.4 g/dL (ref 32.0–36.0)
MCV: 89.2 fL (ref 80.0–100.0)
MPV: 11.3 fL (ref 7.5–12.5)
Monocytes Relative: 8.8 %
Neutro Abs: 5564 cells/uL (ref 1500–7800)
Neutrophils Relative %: 64.7 %
Platelets: 204 10*3/uL (ref 140–400)
RBC: 5.36 10*6/uL (ref 4.20–5.80)
RDW: 12.9 % (ref 11.0–15.0)
Total Lymphocyte: 23.7 %
WBC: 8.6 10*3/uL (ref 3.8–10.8)

## 2019-11-16 ENCOUNTER — Ambulatory Visit (INDEPENDENT_AMBULATORY_CARE_PROVIDER_SITE_OTHER): Payer: PPO | Admitting: General Practice

## 2019-11-16 ENCOUNTER — Other Ambulatory Visit: Payer: Self-pay

## 2019-11-16 DIAGNOSIS — I4891 Unspecified atrial fibrillation: Secondary | ICD-10-CM

## 2019-11-16 DIAGNOSIS — Z7901 Long term (current) use of anticoagulants: Secondary | ICD-10-CM

## 2019-11-16 LAB — POCT INR: INR: 2.6 (ref 2.0–3.0)

## 2019-11-16 NOTE — Progress Notes (Signed)
Medical screening examination/treatment/procedure(s) were performed by non-physician practitioner and as supervising physician I was immediately available for consultation/collaboration. I agree with above. Porche Steinberger, MD   

## 2019-11-16 NOTE — Patient Instructions (Addendum)
Pre visit review using our clinic review tool, if applicable. No additional management support is needed unless otherwise documented below in the visit note.  Continue to take 1/2 tablet daily except 1 tablet on Mondays and Thursdays.  Re-check in 6 weeks.  

## 2019-11-27 ENCOUNTER — Other Ambulatory Visit: Payer: Self-pay | Admitting: Internal Medicine

## 2019-11-27 DIAGNOSIS — Z7901 Long term (current) use of anticoagulants: Secondary | ICD-10-CM

## 2019-12-12 ENCOUNTER — Ambulatory Visit (INDEPENDENT_AMBULATORY_CARE_PROVIDER_SITE_OTHER): Payer: PPO | Admitting: Emergency Medicine

## 2019-12-12 ENCOUNTER — Other Ambulatory Visit: Payer: Self-pay

## 2019-12-12 DIAGNOSIS — I442 Atrioventricular block, complete: Secondary | ICD-10-CM | POA: Diagnosis not present

## 2019-12-12 LAB — CUP PACEART INCLINIC DEVICE CHECK
Battery Impedance: 20100 Ohm
Battery Voltage: 2.65 V
Brady Statistic RV Percent Paced: 85 %
Date Time Interrogation Session: 20210817111934
Implantable Lead Implant Date: 20100216
Implantable Lead Implant Date: 20100216
Implantable Lead Location: 753859
Implantable Lead Location: 753860
Implantable Pulse Generator Implant Date: 20100216
Lead Channel Impedance Value: 321 Ohm
Lead Channel Pacing Threshold Amplitude: 0.75 V
Lead Channel Pacing Threshold Pulse Width: 0.4 ms
Lead Channel Sensing Intrinsic Amplitude: 12 mV
Lead Channel Setting Pacing Amplitude: 2.5 V
Lead Channel Setting Pacing Pulse Width: 0.4 ms
Lead Channel Setting Sensing Sensitivity: 2 mV
Pulse Gen Model: 5826
Pulse Gen Serial Number: 1285538

## 2019-12-12 NOTE — Patient Instructions (Signed)
Battery check 01/30/20 at 11am.

## 2019-12-12 NOTE — Progress Notes (Signed)
Pacemaker check in clinic. Normal device function. Thresholds, sensing, impedances consistent with previous measurements. Device programmed to maximize longevity. No mode switch or high ventricular rates noted. Device programmed at appropriate safety margins. Histogram distribution appropriate for patient activity level. Device programmed to optimize intrinsic conduction. Estimated longevity 0.25 to 0.5 years. Patient declines remote monitoring. Battery check 01/30/20.Patient education completed.

## 2019-12-13 ENCOUNTER — Other Ambulatory Visit: Payer: Self-pay | Admitting: Internal Medicine

## 2019-12-28 ENCOUNTER — Other Ambulatory Visit: Payer: Self-pay

## 2019-12-28 ENCOUNTER — Ambulatory Visit (INDEPENDENT_AMBULATORY_CARE_PROVIDER_SITE_OTHER): Payer: PPO | Admitting: General Practice

## 2019-12-28 DIAGNOSIS — Z7901 Long term (current) use of anticoagulants: Secondary | ICD-10-CM | POA: Diagnosis not present

## 2019-12-28 DIAGNOSIS — I4891 Unspecified atrial fibrillation: Secondary | ICD-10-CM | POA: Diagnosis not present

## 2019-12-28 LAB — POCT INR: INR: 2.7 (ref 2.0–3.0)

## 2019-12-28 NOTE — Patient Instructions (Addendum)
Pre visit review using our clinic review tool, if applicable. No additional management support is needed unless otherwise documented below in the visit note.  Continue to take 1/2 tablet daily except 1 tablet on Mondays and Thursdays.  Re-check in 6 weeks.

## 2019-12-28 NOTE — Progress Notes (Signed)
Agree with management.  Merit Maybee J Kilan Banfill, MD  

## 2020-01-16 ENCOUNTER — Telehealth: Payer: Self-pay | Admitting: Pharmacist

## 2020-01-16 NOTE — Progress Notes (Signed)
    Chronic Care Management Pharmacy Assistant   Name: Albert Hawkins  MRN: [id number] DOB: Dec 08, 1940  Reason for Encounter: General Adherence Call    PCP : Binnie Rail, MD  Allergies:   Allergies  Allergen Reactions  . Ciprofloxacin Diarrhea  . Jardiance [Empagliflozin]     UTI  . Amoxicillin     REACTION: nausea and diarrhea  . Ramipril     Cough only  . Tramadol Hcl     REACTION: lightheaded    Medications: Outpatient Encounter Medications as of 01/16/2020  Medication Sig  . acetaminophen (TYLENOL ARTHRITIS PAIN) 650 MG CR tablet Take 650 mg by mouth at bedtime. arthritis  . cetirizine (ZYRTEC) 10 MG tablet Take 10 mg by mouth daily.   Marland Kitchen FLUoxetine (PROZAC) 10 MG capsule TAKE (1) CAPSULE DAILY.  Marland Kitchen glimepiride (AMARYL) 4 MG tablet TAKE 1 TABLET EVERY DAY WITH BREAKFAST.  . hydrochlorothiazide (MICROZIDE) 12.5 MG capsule TAKE (1) CAPSULE DAILY.  Marland Kitchen losartan (COZAAR) 100 MG tablet TAKE 1 TABLET ONCE DAILY.  . metFORMIN (GLUCOPHAGE) 500 MG tablet TAKE 2 TABLETS TWICE DAILY WITH MEALS.  . Multiple Vitamin (MULTIVITAMIN) tablet Take 1 tablet by mouth daily.    Glory Rosebush ULTRA test strip CHECK BLOOD SUGAR TWICE DAILY.  . pravastatin (PRAVACHOL) 20 MG tablet TAKE 1 TABLET ONCE DAILY.  Marland Kitchen warfarin (COUMADIN) 2 MG tablet TAKE 1/2 TABLET DAILY EXCEPT TAKE 1 TABLET ON MONDAY OR TAKE AS DIRECTED.   No facility-administered encounter medications on file as of 01/16/2020.    Current Diagnosis: Patient Active Problem List   Diagnosis Date Noted  . Reactive airway disease 12/29/2017  . Urinary frequency 12/16/2017  . CKD (chronic kidney disease) stage 3, GFR 30-59 ml/min 07/23/2017  . Long term (current) use of anticoagulants 01/15/2017  . Obesity (BMI 30-39.9) 11/09/2013  . Encounter for therapeutic drug monitoring 06/06/2013  . Complete heart block ([place]) 03/03/2013  . OSA (obstructive sleep apnea) 03/28/2012  . 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 Periodic limb movement  disorder (PLMD) 12/25/2011  . Depression 01/13/2010  . LUMBAR RADICULOPATHY 07/20/2008  . Second degree AV block, Mobitz type II 07/18/2008  . Essential hypertension 01/16/2008  . 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 Atrial fibrillation ([place]) 11/17/2007  . HEARING LOSS, CONDUCTIVE, BILATERAL 04/14/2007  . DM (diabetes mellitus), secondary, uncontrolled, with peripheral vascular complications ([place]) 46/56/8127  . Hyperlipidemia 03/17/2007  . 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 SKIN CANCER, HX OF 05/17/2006    Goals Addressed   None     Follow-Up:  Pharmacist Review    Per clinical pharmacist I was instructed to call to speak with the patient  To see if he has been able down on soda due to his high blood sugar. After speaking with the patient he stated that he has been able to cut down on the amount of soda he drinks, but he has not checked his blood sugar at home but he will start trying to check his levels once daily. Will forward this information to -clinical pharmacist.   Rosendo Gros, [place] Pharmacist Assistant  [phone]

## 2020-01-25 DIAGNOSIS — E119 Type 2 diabetes mellitus without complications: Secondary | ICD-10-CM | POA: Diagnosis not present

## 2020-01-30 ENCOUNTER — Telehealth: Payer: Self-pay

## 2020-01-30 ENCOUNTER — Other Ambulatory Visit: Payer: Self-pay

## 2020-01-30 ENCOUNTER — Ambulatory Visit: Payer: PPO | Admitting: Emergency Medicine

## 2020-01-30 DIAGNOSIS — I442 Atrioventricular block, complete: Secondary | ICD-10-CM

## 2020-01-30 LAB — CUP PACEART INCLINIC DEVICE CHECK
Date Time Interrogation Session: 20211005114817
Implantable Lead Implant Date: 20100216
Implantable Lead Implant Date: 20100216
Implantable Lead Location: 753859
Implantable Lead Location: 753860
Implantable Pulse Generator Implant Date: 20100216
Pulse Gen Model: 5826
Pulse Gen Serial Number: 1285538

## 2020-01-30 NOTE — Telephone Encounter (Signed)
Patient came into device clinic today for battery check.   ERI met 01/02/20. Will need apt. To discuss gen change.

## 2020-01-31 NOTE — Telephone Encounter (Signed)
Gen change apt. scheduled for 02/02/20 with Dr. Rayann Heman.

## 2020-02-02 ENCOUNTER — Other Ambulatory Visit: Payer: Self-pay

## 2020-02-02 ENCOUNTER — Telehealth: Payer: Self-pay | Admitting: Internal Medicine

## 2020-02-02 ENCOUNTER — Encounter: Payer: Self-pay | Admitting: Internal Medicine

## 2020-02-02 ENCOUNTER — Encounter: Payer: Self-pay | Admitting: *Deleted

## 2020-02-02 ENCOUNTER — Ambulatory Visit: Payer: PPO | Admitting: Internal Medicine

## 2020-02-02 VITALS — BP 144/68 | HR 54 | Ht 70.0 in | Wt 200.4 lb

## 2020-02-02 DIAGNOSIS — D6869 Other thrombophilia: Secondary | ICD-10-CM | POA: Diagnosis not present

## 2020-02-02 DIAGNOSIS — I4821 Permanent atrial fibrillation: Secondary | ICD-10-CM | POA: Diagnosis not present

## 2020-02-02 DIAGNOSIS — I442 Atrioventricular block, complete: Secondary | ICD-10-CM

## 2020-02-02 LAB — CBC WITH DIFFERENTIAL/PLATELET
Basophils Absolute: 0.1 10*3/uL (ref 0.0–0.2)
Basos: 1 %
EOS (ABSOLUTE): 0.2 10*3/uL (ref 0.0–0.4)
Eos: 2 %
Hematocrit: 43.5 % (ref 37.5–51.0)
Hemoglobin: 14.7 g/dL (ref 13.0–17.7)
Immature Grans (Abs): 0 10*3/uL (ref 0.0–0.1)
Immature Granulocytes: 0 %
Lymphocytes Absolute: 2.1 10*3/uL (ref 0.7–3.1)
Lymphs: 26 %
MCH: 29.7 pg (ref 26.6–33.0)
MCHC: 33.8 g/dL (ref 31.5–35.7)
MCV: 88 fL (ref 79–97)
Monocytes Absolute: 0.7 10*3/uL (ref 0.1–0.9)
Monocytes: 8 %
Neutrophils Absolute: 4.9 10*3/uL (ref 1.4–7.0)
Neutrophils: 63 %
Platelets: 192 10*3/uL (ref 150–450)
RBC: 4.95 x10E6/uL (ref 4.14–5.80)
RDW: 12.9 % (ref 11.6–15.4)
WBC: 7.9 10*3/uL (ref 3.4–10.8)

## 2020-02-02 LAB — PROTIME-INR
INR: 2.3 — ABNORMAL HIGH (ref 0.9–1.2)
Prothrombin Time: 23 s — ABNORMAL HIGH (ref 9.1–12.0)

## 2020-02-02 LAB — BASIC METABOLIC PANEL
BUN/Creatinine Ratio: 12 (ref 10–24)
BUN: 16 mg/dL (ref 8–27)
CO2: 24 mmol/L (ref 20–29)
Calcium: 9.7 mg/dL (ref 8.6–10.2)
Chloride: 98 mmol/L (ref 96–106)
Creatinine, Ser: 1.31 mg/dL — ABNORMAL HIGH (ref 0.76–1.27)
GFR calc Af Amer: 61 mL/min/{1.73_m2} (ref >59)
GFR calc non Af Amer: 53 mL/min/{1.73_m2} — ABNORMAL LOW (ref >59)
Glucose: 191 mg/dL — ABNORMAL HIGH (ref 65–99)
Potassium: 4.3 mmol/L (ref 3.5–5.2)
Sodium: 138 mmol/L (ref 134–144)

## 2020-02-02 NOTE — Telephone Encounter (Signed)
The number reaching out to the patient was a pre service center recording from [facility]. But they had left for the day. Told the patient I was not sure what they needed but they should call back Monday and/or he has the call back number.

## 2020-02-02 NOTE — Patient Instructions (Addendum)
Medication Instructions:  Your physician recommends that you continue on your current medications as directed. Please refer to the Current Medication list given to you today.  *If you need a refill on your cardiac medications before your next appointment, please call your pharmacy*  Lab Work: CBC, BMP  If you have labs (blood work) drawn today and your tests are completely normal, you will receive your results only by: Marland Kitchen MyChart Message (if you have MyChart) OR . A paper copy in the mail If you have any lab test that is abnormal or we need to change your treatment, we will call you to review the results.  Testing/Procedures: Pacemaker Gen Changeout    Follow-Up: At [facility], you and your health needs are our priority.  As part of our continuing mission to provide you with exceptional heart care, we have created designated Provider Care Teams.  These Care Teams include your primary Cardiologist (physician) and Advanced Practice Providers (APPs -  Physician Assistants and Nurse Practitioners) who all work together to provide you with the care you need, when you need it.  We recommend signing up for the patient portal called "MyChart".  Sign up information is provided on this After Visit Summary.  MyChart is used to connect with patients for Virtual Visits (Telemedicine).  Patients are able to view lab/test results, encounter notes, upcoming appointments, etc.  Non-urgent messages can be sent to your provider as well.   To learn more about what you can do with MyChart, go to [email or web address].     Other Instructions:   Pacemaker Implantation, Adult Pacemaker implantation is a procedure to place a pacemaker inside your chest. A pacemaker is a small computer that sends electrical signals to the heart and helps your heart beat normally. A pacemaker also stores information about your heart rhythms. You may need pacemaker implantation if you:  Have a slow heartbeat  (bradycardia).  Faint (syncope).  Have shortness of breath (dyspnea) due to heart problems. The pacemaker attaches to your heart through a wire, called a lead. Sometimes just one lead is needed. Other times, there will be two leads. There are two types of pacemakers:  Transvenous pacemaker. This type is placed under the skin or muscle of your chest. The lead goes through a vein in the chest area to reach the inside of the heart.  Epicardial pacemaker. This type is placed under the skin or muscle of your chest or belly. The lead goes through your chest to the outside of the heart. Tell a health care provider about:  Any allergies you have.  All medicines you are taking, including vitamins, herbs, eye drops, creams, and over-the-counter medicines.  Any problems you or family members have had with anesthetic medicines.  Any blood or bone disorders you have.  Any surgeries you have had.  Any medical conditions you have.  Whether you are pregnant or may be pregnant. What are the risks? Generally, this is a safe procedure. However, problems may occur, including:  Infection.  Bleeding.  Failure of the pacemaker or the lead.  Collapse of a lung or bleeding into a lung.  Blood clot inside a blood vessel with a lead.  Damage to the heart.  Infection inside the heart (endocarditis).  Allergic reactions to medicines. What happens before the procedure? Staying hydrated Follow instructions from your health care provider about hydration, which may include:  Up to 2 hours before the procedure - you may continue to drink clear liquids, such as water, clear  fruit juice, black coffee, and plain tea. Eating and drinking restrictions Follow instructions from your health care provider about eating and drinking, which may include:  8 hours before the procedure - stop eating heavy meals or foods such as meat, fried foods, or fatty foods.  6 hours before the procedure - stop eating light  meals or foods, such as toast or cereal.  6 hours before the procedure - stop drinking milk or drinks that contain milk.  2 hours before the procedure - stop drinking clear liquids. Medicines  Ask your health care provider about: ? Changing or stopping your regular medicines. This is especially important if you are taking diabetes medicines or blood thinners. ? Taking medicines such as aspirin and ibuprofen. These medicines can thin your blood. Do not take these medicines before your procedure if your health care provider instructs you not to.  You may be given antibiotic medicine to help prevent infection. General instructions  You will have a heart evaluation. This may include an electrocardiogram (ECG), chest X-ray, and heart imaging (echocardiogram,  or echo) tests.  You will have blood tests.  Do not use any products that contain nicotine or tobacco, such as cigarettes and e-cigarettes. If you need help quitting, ask your health care provider.  Plan to have someone take you home from the hospital or clinic.  If you will be going home right after the procedure, plan to have someone with you for 24 hours.  Ask your health care provider how your surgical site will be marked or identified. What happens during the procedure?  To reduce your risk of infection: ? Your health care team will wash or sanitize their hands. ? Your skin will be washed with soap. ? Hair may be removed from the surgical area.  An IV tube will be inserted into one of your veins.  You will be given one or more of the following: ? A medicine to help you relax (sedative). ? A medicine to numb the area (local anesthetic). ? A medicine to make you fall asleep (general anesthetic).  If you are getting a transvenous pacemaker: ? An incision will be made in your upper chest. ? A pocket will be made for the pacemaker. It may be placed under the skin or between layers of muscle. ? The lead will be inserted into  a blood vessel that returns to the heart. ? While X-rays are taken by an imaging machine (fluoroscopy), the lead will be advanced through the vein to the inside of your heart. ? The other end of the lead will be tunneled under the skin and attached to the pacemaker.  If you are getting an epicardial pacemaker: ? An incision will be made near your ribs or breastbone (sternum) for the lead. ? The lead will be attached to the outside of your heart. ? Another incision will be made in your chest or upper belly to create a pocket for the pacemaker. ? The free end of the lead will be tunneled under the skin and attached to the pacemaker.  The transvenous or epicardial pacemaker will be tested. Imaging studies may be done to check the lead position.  The incisions will be closed with stitches (sutures), adhesive strips, or skin glue.  Bandages (dressing) will be placed over the incisions. The procedure may vary among health care providers and hospitals. What happens after the procedure?  Your blood pressure, heart rate, breathing rate, and blood oxygen level will be monitored until the medicines  you were given have worn off.  You will be given antibiotics and pain medicine.  ECG and chest x-rays will be done.  You will wear a continuous type of ECG (Holter monitor) to check your heart rhythm.  Your health care provider will program the pacemaker.  Do not drive for 24 hours if you received a sedative. This information is not intended to replace advice given to you by your health care provider. Make sure you discuss any questions you have with your health care provider. Document Revised: 12/31/2017 Document Reviewed: 09/25/2015 Elsevier Patient Education  [place].

## 2020-02-02 NOTE — Progress Notes (Signed)
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  Since last being seen in our clinic, the patient reports doing very well.  Today, he denies symptoms of palpitations, chest pain, shortness of breath,  lower extremity edema, dizziness, presyncope, or syncope.  The patient is otherwise without complaint today.   Past Medical History:  Diagnosis Date  . Atrial fibrillation (HCC)    paroxysmal, on coumadin  . Benign essential HTN   . Diabetes mellitus type II   . Hearing loss, conductive, bilateral   . Hyperlipidemia   . 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 Mobitz (type) II atrioventricular block    S/P  PPM (SJM) by Dr Rayann Heman  . Neoplasm of uncertain behavior of skin   . PVC's (premature ventricular contractions)   . 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 Sleep disorder    Past Surgical History:  Procedure Laterality Date  . 3 fatty tumors     benign  . HAND SURGERY     Right thumb amputated and reattached   . HERNIA REPAIR     Umbilical  . no colonoscopy     "did not want one" (warfarin goes against it; 11/09/13)  . PACEMAKER PLACEMENT     06/12/2008, Dr. Thompson Grayer  . SKIN CANCER EXCISION     above nose     ROS- all systems are reviewed and negative except as per HPI above  Current Outpatient Medications  Medication Sig Dispense Refill  . acetaminophen (TYLENOL ARTHRITIS PAIN) 650 MG CR tablet Take 650 mg by mouth at bedtime. arthritis    . cetirizine (ZYRTEC) 10 MG tablet Take 10 mg by mouth daily.     Marland Kitchen FLUoxetine (PROZAC) 10 MG capsule TAKE (1) CAPSULE DAILY. 90 capsule 0  . glimepiride (AMARYL) 4 MG tablet TAKE 1 TABLET EVERY DAY WITH BREAKFAST. 90 tablet 0  . hydrochlorothiazide (MICROZIDE) 12.5 MG capsule TAKE (1) CAPSULE DAILY. 90 capsule 0  . losartan (COZAAR) 100 MG tablet TAKE 1 TABLET ONCE DAILY. 90  tablet 0  . metFORMIN (GLUCOPHAGE) 500 MG tablet TAKE 2 TABLETS TWICE DAILY WITH MEALS. 360 tablet 0  . Multiple Vitamin (MULTIVITAMIN) tablet Take 1 tablet by mouth daily.      Glory Rosebush ULTRA test strip CHECK BLOOD SUGAR TWICE DAILY. 100 strip 0  . pravastatin (PRAVACHOL) 20 MG tablet TAKE 1 TABLET ONCE DAILY. 90 tablet 0  . warfarin (COUMADIN) 2 MG tablet TAKE 1/2 TABLET DAILY EXCEPT TAKE 1 TABLET ON MONDAY OR TAKE AS DIRECTED. 75 tablet 0   No current facility-administered medications for this visit.    Physical Exam: Vitals:   02/02/20 1109  BP: (!) 144/68  Pulse: (!) 54  SpO2: 98%  Weight: 200 lb 6.4 oz (90.9 kg)  Height: 5\' 10"  (1.778 m)    GEN- The patient is well appearing, alert and oriented x 3 today.   Head- normocephalic, atraumatic Eyes-  Sclera clear, conjunctiva pink Ears- hearing intact Oropharynx- clear Lungs-  normal work of breathing Chest- pacemaker pocket is well healed Heart- irregular rate and rhythm  GI- soft, NT, ND, + BS Extremities- no clubbing, cyanosis, or edema  Pacemaker interrogation- reviewed in detail today,  See PACEART report Echo 05/12/18- EF 55-60%, trivial MR, severe LA enlargement  ekg tracing  ordered today is personally reviewed and shows afib with regular RR intervals at 54 bpm  Assessment and Plan:  1. Symptomatic complete heart block Normal pacemaker function He is at RRT See Claudia Desanctis Art report No changes today he is not device dependant today Risks, benefits, and alternatives to PPM pulse generator replacement were discussed in detail today.  The patient understands that risks include but are not limited to bleeding, infection, pneumothorax, perforation, tamponade, vascular damage, renal failure, MI, stroke, death, damage to his existing leads, and lead dislodgement and wishes to proceed.  We will therefore schedule the procedure at the next available time. Hold coumadin 24 hours prior to the procedure  2. Permanent afib Rate  controlled He is on warfarin for chads2vasc score of at least 3  3. Moderate MR Improved to mild by last echo 05/12/18  4. HTN Stable No change required today   Risks, benefits and potential toxicities for medications prescribed and/or refilled reviewed with patient today.   Thompson Grayer MD, [facility] 02/02/2020 11:23 AM

## 2020-02-02 NOTE — H&P (View-Only) (Signed)
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  Since last being seen in our clinic, the patient reports doing very well.  Today, he denies symptoms of palpitations, chest pain, shortness of breath,  lower extremity edema, dizziness, presyncope, or syncope.  The patient is otherwise without complaint today.   Past Medical History:  Diagnosis Date  . Atrial fibrillation (HCC)    paroxysmal, on coumadin  . Benign essential HTN   . Diabetes mellitus type II   . Hearing loss, conductive, bilateral   . Hyperlipidemia   . 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 Mobitz (type) II atrioventricular block    S/P  PPM (SJM) by Dr Rayann Heman  . Neoplasm of uncertain behavior of skin   . PVC's (premature ventricular contractions)   . 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 Sleep disorder    Past Surgical History:  Procedure Laterality Date  . 3 fatty tumors     benign  . HAND SURGERY     Right thumb amputated and reattached   . HERNIA REPAIR     Umbilical  . no colonoscopy     "did not want one" (warfarin goes against it; 11/09/13)  . PACEMAKER PLACEMENT     06/12/2008, Dr. Thompson Grayer  . SKIN CANCER EXCISION     above nose     ROS- all systems are reviewed and negative except as per HPI above  Current Outpatient Medications  Medication Sig Dispense Refill  . acetaminophen (TYLENOL ARTHRITIS PAIN) 650 MG CR tablet Take 650 mg by mouth at bedtime. arthritis    . cetirizine (ZYRTEC) 10 MG tablet Take 10 mg by mouth daily.     Marland Kitchen FLUoxetine (PROZAC) 10 MG capsule TAKE (1) CAPSULE DAILY. 90 capsule 0  . glimepiride (AMARYL) 4 MG tablet TAKE 1 TABLET EVERY DAY WITH BREAKFAST. 90 tablet 0  . hydrochlorothiazide (MICROZIDE) 12.5 MG capsule TAKE (1) CAPSULE DAILY. 90 capsule 0  . losartan (COZAAR) 100 MG tablet TAKE 1 TABLET ONCE DAILY. 90  tablet 0  . metFORMIN (GLUCOPHAGE) 500 MG tablet TAKE 2 TABLETS TWICE DAILY WITH MEALS. 360 tablet 0  . Multiple Vitamin (MULTIVITAMIN) tablet Take 1 tablet by mouth daily.      Glory Rosebush ULTRA test strip CHECK BLOOD SUGAR TWICE DAILY. 100 strip 0  . pravastatin (PRAVACHOL) 20 MG tablet TAKE 1 TABLET ONCE DAILY. 90 tablet 0  . warfarin (COUMADIN) 2 MG tablet TAKE 1/2 TABLET DAILY EXCEPT TAKE 1 TABLET ON MONDAY OR TAKE AS DIRECTED. 75 tablet 0   No current facility-administered medications for this visit.    Physical Exam: Vitals:   02/02/20 1109  BP: (!) 144/68  Pulse: (!) 54  SpO2: 98%  Weight: 200 lb 6.4 oz (90.9 kg)  Height: 5\' 10"  (1.778 m)    GEN- The patient is well appearing, alert and oriented x 3 today.   Head- normocephalic, atraumatic Eyes-  Sclera clear, conjunctiva pink Ears- hearing intact Oropharynx- clear Lungs-  normal work of breathing Chest- pacemaker pocket is well healed Heart- irregular rate and rhythm  GI- soft, NT, ND, + BS Extremities- no clubbing, cyanosis, or edema  Pacemaker interrogation- reviewed in detail today,  See PACEART report Echo 05/12/18- EF 55-60%, trivial MR, severe LA enlargement  ekg tracing  ordered today is personally reviewed and shows afib with regular RR intervals at 54 bpm  Assessment and Plan:  1. Symptomatic complete heart block Normal pacemaker function He is at RRT See Claudia Desanctis Art report No changes today he is not device dependant today Risks, benefits, and alternatives to PPM pulse generator replacement were discussed in detail today.  The patient understands that risks include but are not limited to bleeding, infection, pneumothorax, perforation, tamponade, vascular damage, renal failure, MI, stroke, death, damage to his existing leads, and lead dislodgement and wishes to proceed.  We will therefore schedule the procedure at the next available time. Hold coumadin 24 hours prior to the procedure  2. Permanent afib Rate  controlled He is on warfarin for chads2vasc score of at least 3  3. Moderate MR Improved to mild by last echo 05/12/18  4. HTN Stable No change required today   Risks, benefits and potential toxicities for medications prescribed and/or refilled reviewed with patient today.   Thompson Grayer MD, [facility] 02/02/2020 11:23 AM

## 2020-02-02 NOTE — Telephone Encounter (Signed)
Patient said that he was told to call back and speak to [place]. Please call.

## 2020-02-06 ENCOUNTER — Other Ambulatory Visit: Payer: Self-pay

## 2020-02-06 ENCOUNTER — Ambulatory Visit (INDEPENDENT_AMBULATORY_CARE_PROVIDER_SITE_OTHER): Payer: PPO | Admitting: General Practice

## 2020-02-06 ENCOUNTER — Ambulatory Visit: Payer: PPO | Admitting: General Practice

## 2020-02-06 DIAGNOSIS — Z7901 Long term (current) use of anticoagulants: Secondary | ICD-10-CM | POA: Diagnosis not present

## 2020-02-06 DIAGNOSIS — Z23 Encounter for immunization: Secondary | ICD-10-CM | POA: Diagnosis not present

## 2020-02-06 DIAGNOSIS — I4891 Unspecified atrial fibrillation: Secondary | ICD-10-CM | POA: Diagnosis not present

## 2020-02-06 NOTE — Progress Notes (Signed)
Agree with management.  Edmond Ginsberg J Kaiyden Simkin, MD  

## 2020-02-06 NOTE — Progress Notes (Signed)
Patient into device clinic for battery check. ERI triggered 01/02/20. See attachment.   Routing to Dr. Rayann Heman and RN to make apt. to discuss gen change. LW

## 2020-02-06 NOTE — Patient Instructions (Signed)
Pre visit review using our clinic review tool, if applicable. No additional management support is needed unless otherwise documented below in the visit note.  Continue to take 1/2 tablet daily except 1 tablet on Mondays and Thursdays.  Re-check in 6 weeks.

## 2020-02-07 ENCOUNTER — Other Ambulatory Visit (HOSPITAL_COMMUNITY)
Admission: RE | Admit: 2020-02-07 | Discharge: 2020-02-07 | Disposition: A | Discharge status: Home / Self Care | Payer: PPO | Source: Ambulatory Visit | Attending: Internal Medicine | Admitting: Internal Medicine

## 2020-02-07 DIAGNOSIS — Z20822 Contact with and (suspected) exposure to covid-19: Secondary | ICD-10-CM | POA: Diagnosis not present

## 2020-02-07 DIAGNOSIS — Z01812 Encounter for preprocedural laboratory examination: Secondary | ICD-10-CM | POA: Insufficient documentation

## 2020-02-07 LAB — SARS CORONAVIRUS 2 (TAT 6-24 HRS): SARS Coronavirus 2: NEGATIVE

## 2020-02-07 NOTE — Progress Notes (Signed)
Instructed patient on the following items: Arrival time 1130 Nothing to eat or drink after midnight No meds AM of procedure Responsible person to drive you home and stay with you for 24 hrs Wash with special soap night before and morning of procedure If on anti-coagulant drug instructions on Coumadin- last doses was yesterday 10/12.

## 2020-02-08 ENCOUNTER — Ambulatory Visit: Payer: PPO

## 2020-02-08 ENCOUNTER — Ambulatory Visit (HOSPITAL_COMMUNITY)
Admission: RE | Admit: 2020-02-08 | Discharge: 2020-02-08 | Disposition: A | Discharge status: Home / Self Care | Payer: PPO | Attending: Internal Medicine | Admitting: Internal Medicine

## 2020-02-08 ENCOUNTER — Encounter (HOSPITAL_COMMUNITY)
Admission: RE | Disposition: A | Discharge status: Home / Self Care | Payer: PPO | Source: Home / Self Care | Attending: Internal Medicine

## 2020-02-08 ENCOUNTER — Other Ambulatory Visit: Payer: Self-pay

## 2020-02-08 DIAGNOSIS — I441 Atrioventricular block, second degree: Secondary | ICD-10-CM | POA: Diagnosis not present

## 2020-02-08 DIAGNOSIS — E785 Hyperlipidemia, unspecified: Secondary | ICD-10-CM | POA: Insufficient documentation

## 2020-02-08 DIAGNOSIS — I4821 Permanent atrial fibrillation: Secondary | ICD-10-CM | POA: Insufficient documentation

## 2020-02-08 DIAGNOSIS — Z79899 Other long term (current) drug therapy: Secondary | ICD-10-CM | POA: Insufficient documentation

## 2020-02-08 DIAGNOSIS — I10 Essential (primary) hypertension: Secondary | ICD-10-CM | POA: Insufficient documentation

## 2020-02-08 DIAGNOSIS — Z7901 Long term (current) use of anticoagulants: Secondary | ICD-10-CM | POA: Insufficient documentation

## 2020-02-08 DIAGNOSIS — Z45010 Encounter for checking and testing of cardiac pacemaker pulse generator [battery]: Secondary | ICD-10-CM | POA: Insufficient documentation

## 2020-02-08 DIAGNOSIS — E119 Type 2 diabetes mellitus without complications: Secondary | ICD-10-CM | POA: Insufficient documentation

## 2020-02-08 DIAGNOSIS — Z85828 Personal history of other malignant neoplasm of skin: Secondary | ICD-10-CM | POA: Diagnosis not present

## 2020-02-08 DIAGNOSIS — Z7984 Long term (current) use of oral hypoglycemic drugs: Secondary | ICD-10-CM | POA: Insufficient documentation

## 2020-02-08 HISTORY — PX: PPM GENERATOR CHANGEOUT: EP1233

## 2020-02-08 LAB — PROTIME-INR
INR: 2.1 — ABNORMAL HIGH (ref 0.8–1.2)
Prothrombin Time: 23 seconds — ABNORMAL HIGH (ref 11.4–15.2)

## 2020-02-08 LAB — GLUCOSE, CAPILLARY: Glucose-Capillary: 200 mg/dL — ABNORMAL HIGH (ref 70–99)

## 2020-02-08 SURGERY — PPM GENERATOR CHANGEOUT
Anesthesia: LOCAL

## 2020-02-08 MED ORDER — SODIUM CHLORIDE 0.9 % IV SOLN
80.0000 mg | INTRAVENOUS | Status: AC
  Administered 2020-02-08: 80 mg

## 2020-02-08 MED ORDER — VANCOMYCIN HCL IN DEXTROSE 1-5 GM/200ML-% IV SOLN
INTRAVENOUS | Status: AC
  Filled 2020-02-08: qty 200

## 2020-02-08 MED ORDER — CEFAZOLIN SODIUM-DEXTROSE 2-4 GM/100ML-% IV SOLN
INTRAVENOUS | Status: AC
  Filled 2020-02-08: qty 100

## 2020-02-08 MED ORDER — SODIUM CHLORIDE 0.9% FLUSH: 3.0000 mL | INTRAVENOUS | Status: DC | PRN

## 2020-02-08 MED ORDER — LIDOCAINE HCL 1 % IJ SOLN
INTRAMUSCULAR | Status: AC
  Filled 2020-02-08: qty 60

## 2020-02-08 MED ORDER — SODIUM CHLORIDE 0.9 % IV SOLN: INTRAVENOUS | Status: DC

## 2020-02-08 MED ORDER — ACETAMINOPHEN 325 MG PO TABS: 325.0000 mg | ORAL_TABLET | ORAL | Status: DC | PRN

## 2020-02-08 MED ORDER — VANCOMYCIN HCL IN DEXTROSE 1-5 GM/200ML-% IV SOLN
1000.0000 mg | INTRAVENOUS | Status: AC
  Administered 2020-02-08: 1000 mg via INTRAVENOUS

## 2020-02-08 MED ORDER — SODIUM CHLORIDE 0.9 % IV SOLN
INTRAVENOUS | Status: AC
  Filled 2020-02-08: qty 2

## 2020-02-08 MED ORDER — ONDANSETRON HCL 4 MG/2ML IJ SOLN: 4.0000 mg | Freq: Four times a day (QID) | INTRAMUSCULAR | Status: DC | PRN

## 2020-02-08 MED ORDER — SODIUM CHLORIDE 0.9% FLUSH: 3.0000 mL | Freq: Two times a day (BID) | INTRAVENOUS | Status: DC

## 2020-02-08 MED ORDER — SODIUM CHLORIDE 0.9 % IV SOLN: 250.0000 mL | INTRAVENOUS | Status: DC | PRN

## 2020-02-08 SURGICAL SUPPLY — 4 items
CABLE SURGICAL S-101-97-12 (CABLE) ×2 IMPLANT
PACEMAKER ASSURITY DR-RF (Pacemaker) ×2 IMPLANT
PAD PRO RADIOLUCENT 2001M-C (PAD) ×2 IMPLANT
TRAY PACEMAKER INSERTION (PACKS) ×2 IMPLANT

## 2020-02-08 NOTE — Interval H&P Note (Signed)
History and Physical Interval Note:  02/08/2020 1:40 PM  Albert Hawkins  has presented today for surgery, with the diagnosis of eol.  The various methods of treatment have been discussed with the patient and family. After consideration of risks, benefits and other options for treatment, the patient has consented to  Procedure(s): PPM GENERATOR CHANGEOUT (N/A) as a surgical intervention.  The patient's history has been reviewed, patient examined, no change in status, stable for surgery.  I have reviewed the patient's chart and labs.  Questions were answered to the patient's satisfaction.     Thompson Grayer

## 2020-02-08 NOTE — Discharge Instructions (Signed)
Pacemaker Battery Change, Care After This sheet gives you information about how to care for yourself after your procedure. Your health care provider may also give you more specific instructions. If you have problems or questions, contact your health care provider. What can I expect after the procedure? After your procedure, it is common to have:  Pain or soreness at the site where the pacemaker was inserted.  Swelling at the site where the pacemaker was inserted. Follow these instructions at home: Incision care   Keep the incision clean and dry. ? Do not take baths, swim, or use a hot tub until your health care provider approves. ? You may shower the day after your procedure, or as directed by your health care provider. ? Pat the area dry with a clean towel. Do not rub the area. This may cause bleeding.  Follow instructions from your health care provider about how to take care of your incision. Make sure you: ? Wash your hands with soap and water before you change your bandage (dressing). If soap and water are not available, use hand sanitizer. ? Change your dressing as told by your health care provider. ? Leave stitches (sutures), skin glue, or adhesive strips in place. These skin closures may need to stay in place for 2 weeks or longer. If adhesive strip edges start to loosen and curl up, you may trim the loose edges. Do not remove adhesive strips completely unless your health care provider tells you to do that.  Check your incision area every day for signs of infection. Check for: ? More redness, swelling, or pain. ? More fluid or blood. ? Warmth. ? Pus or a bad smell. Activity  Do not lift anything that is heavier than 10 lb (4.5 kg) until your health care provider says it is okay to do so.  For the first 2 weeks, or as long as told by your health care provider: ? Avoid lifting your left arm higher than your shoulder. ? Be gentle when you move your arms over your head. It is okay  to raise your arm to comb your hair. ? Avoid strenuous exercise.  Ask your health care provider when it is okay to: ? Resume your normal activities. ? Return to work or school. ? Resume sexual activity. Eating and drinking  Eat a heart-healthy diet. This should include plenty of fresh fruits and vegetables, whole grains, low-fat dairy products, and lean protein like chicken and fish.  Limit alcohol intake to no more than 1 drink a day for non-pregnant women and 2 drinks a day for men. One drink equals 12 oz of beer, 5 oz of wine, or 1 oz of hard liquor.  Check ingredients and nutrition facts on packaged foods and beverages. Avoid the following types of food: ? Food that is high in salt (sodium). ? Food that is high in saturated fat, like full-fat dairy or red meat. ? Food that is high in trans fat, like fried food. ? Food and drinks that are high in sugar. Lifestyle  Do not use any products that contain nicotine or tobacco, such as cigarettes and e-cigarettes. If you need help quitting, ask your health care provider.  Take steps to manage and control your weight.  Get regular exercise. Aim for 150 minutes of moderate-intensity exercise (such as walking or yoga) or 75 minutes of vigorous exercise (such as running or swimming) each week.  Manage other health problems, such as diabetes or high blood pressure. Ask your health  care provider how you can manage these conditions. General instructions  Do not drive for 24 hours after your procedure if you were given a medicine to help you relax (sedative).  Take over-the-counter and prescription medicines only as told by your health care provider.  Avoid putting pressure on the area where the pacemaker was placed.  If you need an MRI after your pacemaker has been placed, be sure to tell the health care provider who orders the MRI that you have a pacemaker.  Avoid close and prolonged exposure to electrical devices that have strong  magnetic fields. These include: ? Cell phones. Avoid keeping them in a pocket near the pacemaker, and try using the ear opposite the pacemaker. ? MP3 players. ? Household appliances, like microwaves. ? Metal detectors. ? Electric generators. ? High-tension wires.  Keep all follow-up visits as directed by your health care provider. This is important. Contact a health care provider if:  You have pain at the incision site that is not relieved by over-the-counter or prescription medicines.  You have any of these around your incision site or coming from it: ? More redness, swelling, or pain. ? Fluid or blood. ? Warmth to the touch. ? Pus or a bad smell.  You have a fever.  You feel brief, occasional palpitations, light-headedness, or any symptoms that you think might be related to your heart. Get help right away if:  You experience chest pain that is different from the pain at the pacemaker site.  You develop a red streak that extends above or below the incision site.  You experience shortness of breath.  You have palpitations or an irregular heartbeat.  You have light-headedness that does not go away quickly.  You faint or have dizzy spells.  Your pulse suddenly drops or increases rapidly and does not return to normal.  You begin to gain weight and your legs and ankles swell. Summary  After your procedure, it is common to have pain, soreness, and some swelling where the pacemaker was inserted.  Make sure to keep your incision clean and dry. Follow instructions from your health care provider about how to take care of your incision.  Check your incision every day for signs of infection, such as more pain or swelling, pus or a bad smell, warmth, or leaking fluid and blood.  Avoid strenuous exercise and lifting your left arm higher than your shoulder for 2 weeks, or as long as told by your health care provider. This information is not intended to replace advice given to you by  your health care provider. Make sure you discuss any questions you have with your health care provider. Document Revised: 03/26/2017 Document Reviewed: 03/05/2016 Elsevier Patient Education  2020 [employer]. Resume coumadin on 02/09/20

## 2020-02-08 NOTE — Progress Notes (Signed)
Dr Rayann Heman notified of B/P; no new orders, ok to d/c home

## 2020-02-09 ENCOUNTER — Encounter (HOSPITAL_COMMUNITY): Payer: Self-pay | Admitting: Internal Medicine

## 2020-02-10 IMAGING — DX DG CHEST 2V
2 series · 2 of 2 positions shown · non-contrast
Comparison: 04/10/2014 and earlier.

CLINICAL DATA: 76-year-old male with cough fever and chest pain for
2 days.

EXAM:
CHEST - 2 VIEW

[chest pa]
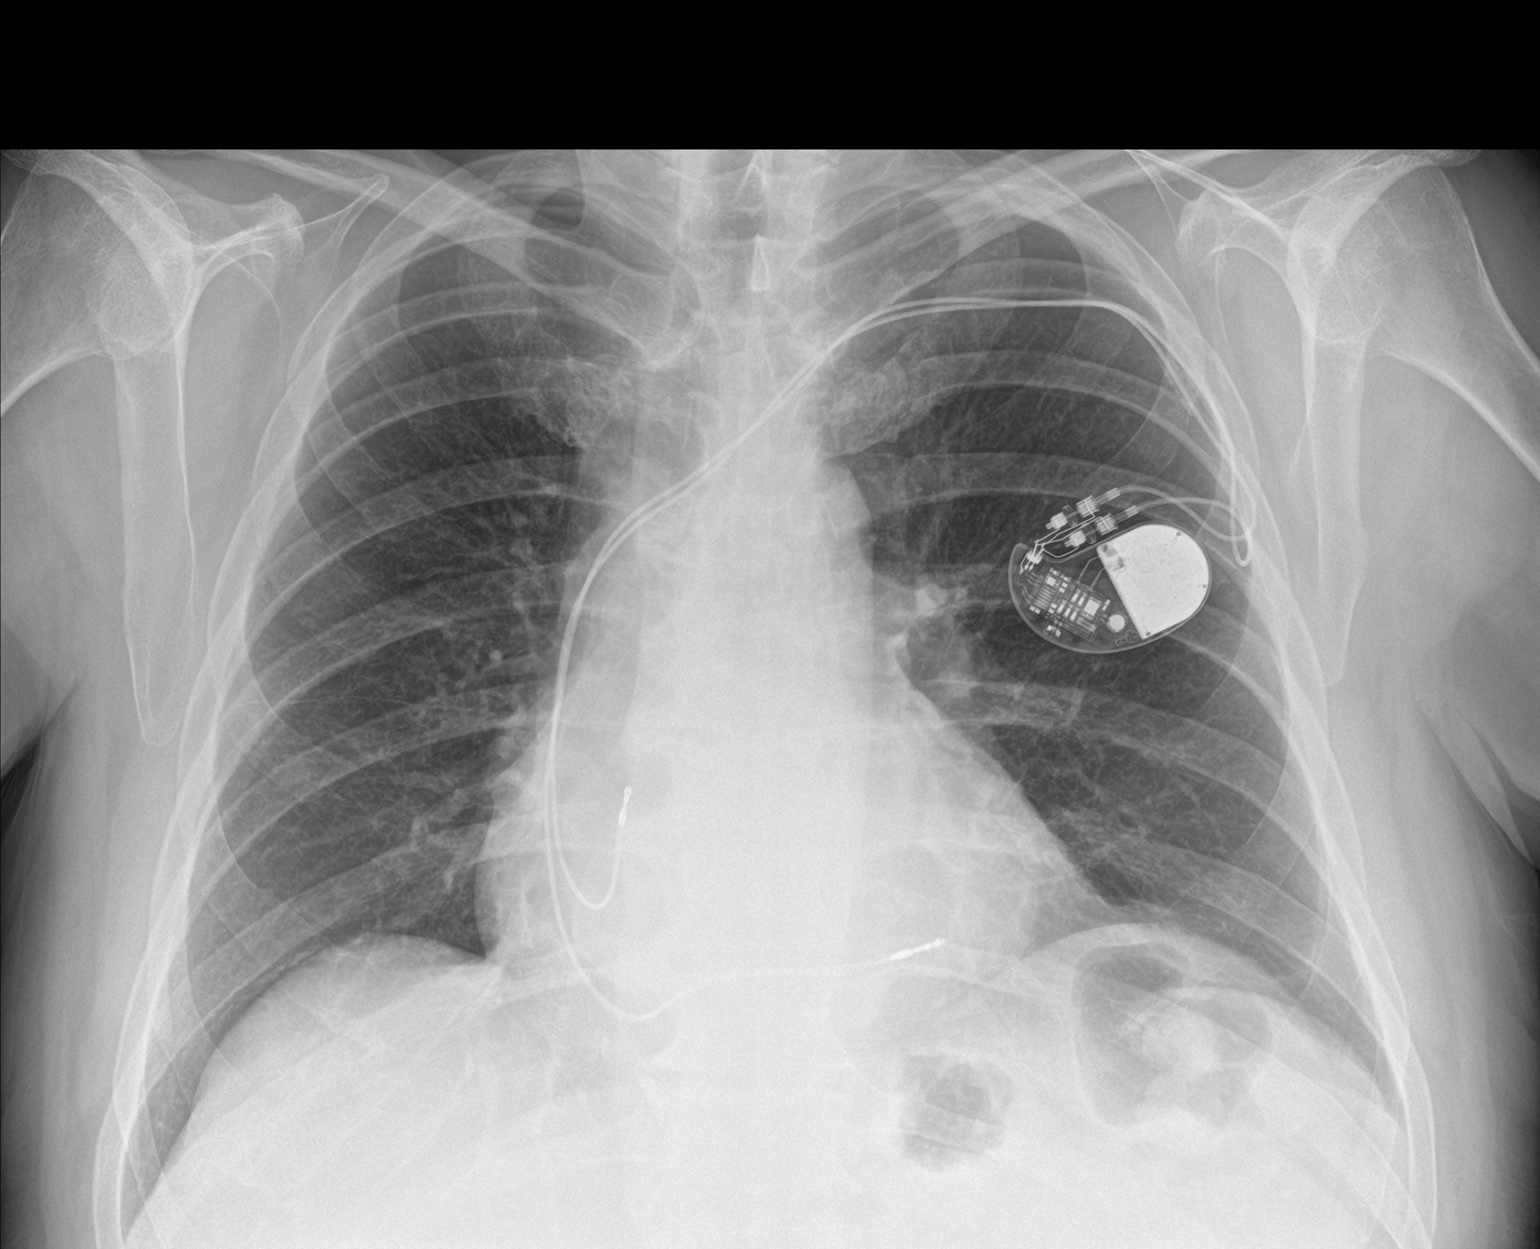

[chest lat]
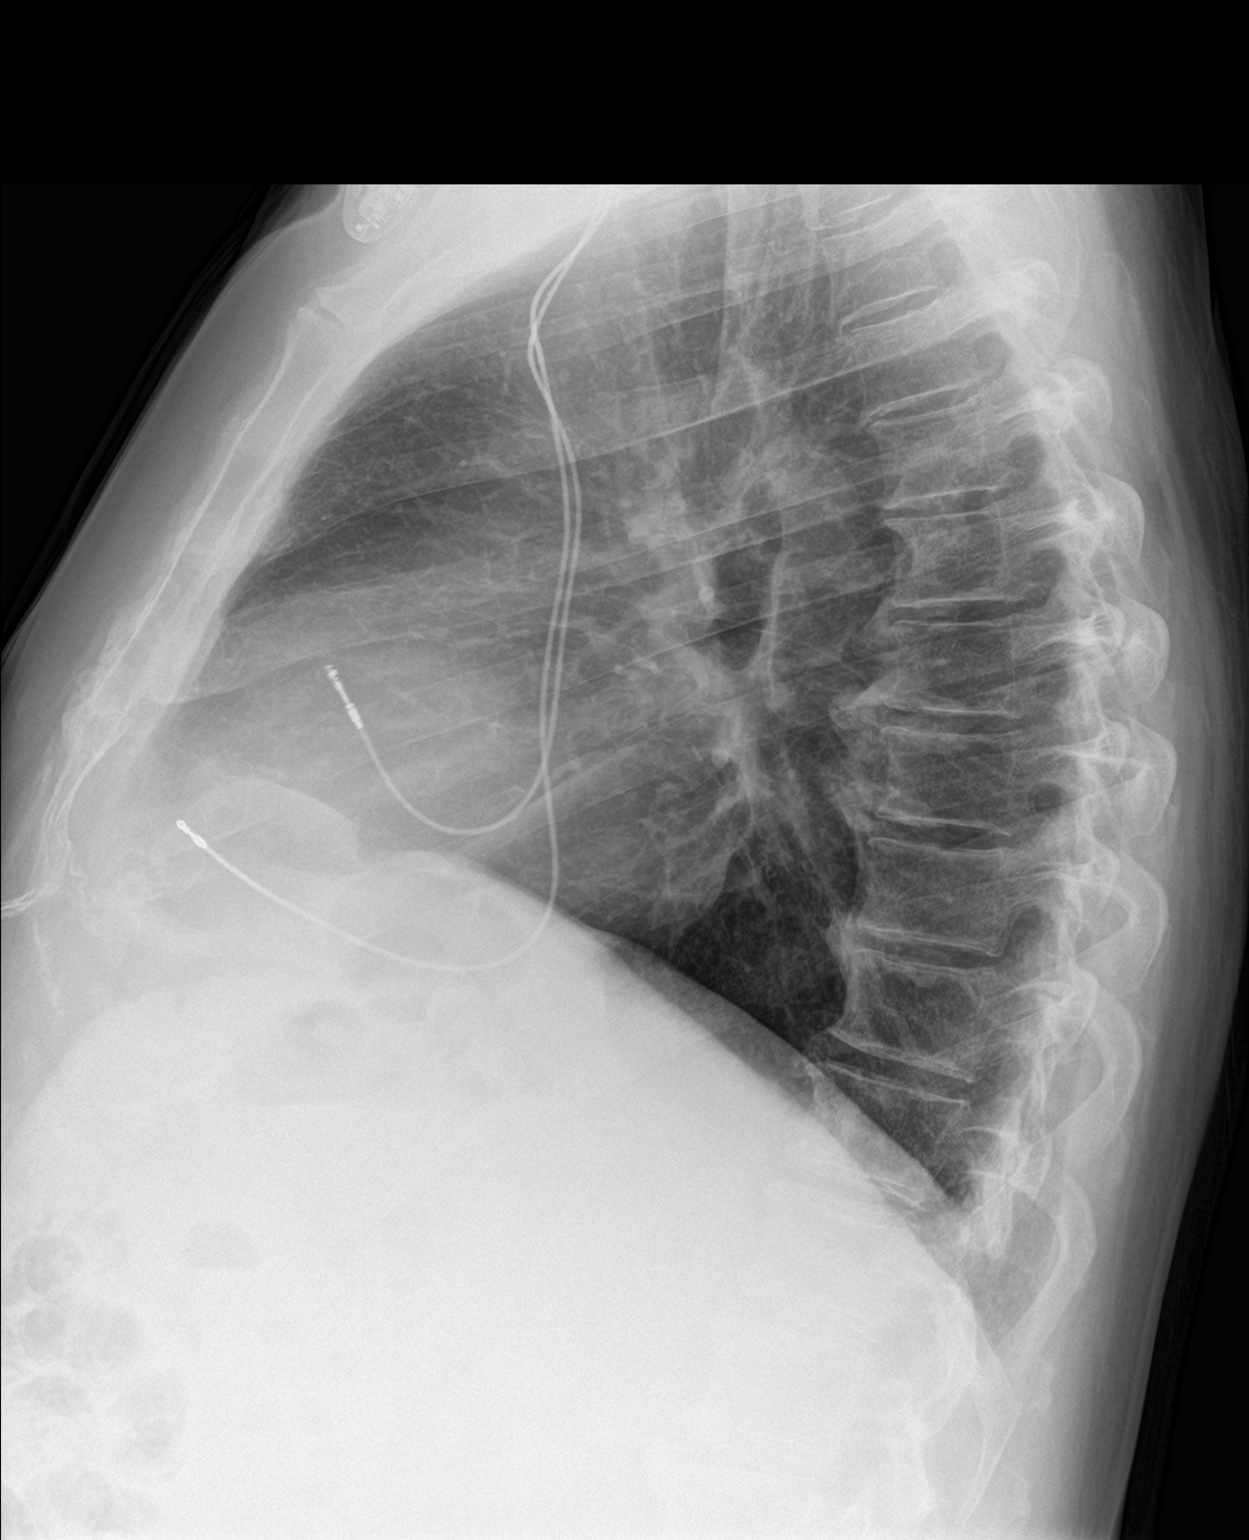

[2 of 2 positions shown; findings below may reference images not displayed]

FINDINGS: Lung volumes and mediastinal contours remain within normal limits.
Stable left chest dual lead cardiac pacemaker. No pneumothorax,
pulmonary edema, pleural effusion or abnormal pulmonary opacity.
Stable visualized osseous structures. Visualized tracheal air column
is within normal limits. Negative visible bowel gas pattern.
IMPRESSION: Stable and largely unremarkable. No acute cardiopulmonary
abnormality.

## 2020-02-15 ENCOUNTER — Ambulatory Visit: Payer: PPO | Admitting: Pharmacist

## 2020-02-15 ENCOUNTER — Other Ambulatory Visit: Payer: Self-pay

## 2020-02-15 DIAGNOSIS — IMO0002 Reserved for concepts with insufficient information to code with codable children: Secondary | ICD-10-CM

## 2020-02-15 DIAGNOSIS — E1365 Other specified diabetes mellitus with hyperglycemia: Secondary | ICD-10-CM

## 2020-02-15 DIAGNOSIS — I10 Essential (primary) hypertension: Secondary | ICD-10-CM

## 2020-02-15 DIAGNOSIS — E7849 Other hyperlipidemia: Secondary | ICD-10-CM

## 2020-02-15 DIAGNOSIS — I4891 Unspecified atrial fibrillation: Secondary | ICD-10-CM

## 2020-02-15 NOTE — Patient Instructions (Signed)
Visit Information  Phone number for Pharmacist: [phone]  Goals Addressed            This Visit's Progress   . Pharmacy Care Plan   On track    CARE PLAN ENTRY (see longitudinal plan of care for additional care plan information)  Current Barriers:  . Chronic Disease Management support, education, and care coordination needs related to Hypertension, Hyperlipidemia, Diabetes, and Atrial Fibrillation   Hypertension BP Readings from Last 3 Encounters:  02/08/20 (!) 170/92  02/02/20 (!) 144/68  11/06/19 126/62 .  Pharmacist Clinical Goal(s): o Over the next 90 days, patient will work with PharmD and providers to maintain BP goal <130/80 . Current regimen:  o Losartan 100 mg daily o HCTZ 12.5 mg daily . Interventions: o Discussed BP goals and benefits of medications for prevention of heart attack / stroke . Patient self care activities - Over the next 90 days, patient will: o Check BP weekly, document, and provide at future appointments o Ensure daily salt intake < 2300 mg/day  Hyperlipidemia Lab Results  Component Value Date/Time   LDLCALC 84 11/06/2019 11:54 AM   LDLDIRECT 70.0 05/08/2019 12:18 PM .  Pharmacist Clinical Goal(s): o Over the next 90 days, patient will work with PharmD and providers to maintain LDL goal < 100 . Current regimen:  o Pravastatin 20 mg daily . Interventions: o Discussed cholesterol goals and benefits of medications for prevention of heart attack / stroke . Patient self care activities - Over the next 90 days, patient will: o Continue current medications  Diabetes Lab Results  Component Value Date/Time   HGBA1C 7.7 (H) 11/06/2019 11:54 AM   HGBA1C 7.8 (H) 05/08/2019 12:18 PM .  Pharmacist Clinical Goal(s): o Over the next 90 days, patient will work with PharmD and providers to achieve A1c goal <7% . Current regimen:  o metformin 1000 mg twice a day o glimepiride 4 mg daily in AM . Interventions: o Discussed A1c goals and benefits of  medications for prevention of diabetic complications . Patient self care activities - Over the next 90 days, patient will: o Check blood sugar 2-3 times weekly document, and provide at future appointments o Contact provider with any episodes of hypoglycemia  Atrial fibrillation . Pharmacist Clinical Goal(s) o Over the next 90 days, patient will work with PharmD and providers to optimize therapy . Current regimen:  o Warfarin 2 mg as directed . Interventions: o Discussed importance of consistent Vitamin K intake (leafy, green vegetables) . Patient self care activities - Over the next 90 days, patient will: o Continue current medication o Follow up with Coumadin RN as scheduled  Medication management . Pharmacist Clinical Goal(s): o Over the next 90 days, patient will work with PharmD and providers to maintain optimal medication adherence . Current pharmacy: [facility] . Interventions o Comprehensive medication review performed. o Continue current medication management strategy . Patient self care activities - Over the next 90 days, patient will: o Focus on medication adherence by pill box o Take medications as prescribed o Report any questions or concerns to PharmD and/or provider(s)  Please see past updates related to this goal by clicking on the "Past Updates" button in the selected goal     . COMPLETED: Reduce Coke       Pt will not buy more coke for next 30 days    . Restart CPAP   Not on track    Pt would like to get back in habit of  using CPAP       Patient verbalizes understanding of instructions provided today.  Telephone follow up appointment with pharmacy team member scheduled for: 4 months  Charlene Brooke, PharmD, [facility] Clinical Pharmacist [facility] [phone]

## 2020-02-15 NOTE — Chronic Care Management (AMB) (Signed)
 Chronic Care Management Pharmacy  Name: Albert Hawkins  MRN: [id number] DOB: 11/22/1940  Chief Complaint/ HPI  Albert Hawkins,  79 y.o. , male presents for their Follow-Up CCM visit with the clinical pharmacist via telephone.   Pt reports he knows his medication and manages them himself. He uses a 30-day pill box that he spends 1 hr setting up each month. He uses [facility] and is "tickled to death" with their service. He pays ~$100 for 90 day supply of all meds and reports this is reasonable for him. He has been isolating with his wife during the pandemic and is getting his second COVID vaccine next week.   PCP : Burns, Stacy J, MD  Their chronic conditions include: Hypertension, Hyperlipidemia, Diabetes, Atrial Fibrillation, Chronic Kidney Disease, Depression and BPH, Complete heart block w/ PPM  Office Visits:  11/06/19 Dr Burns OV: change glimepride to Rybelsus. Pt never started due to cost.  05/19/19 Dr Burns: patient is stable, no med changes. Encouraged regular exercise and healthy diet. Pt was taking 1 Aleve daily for arthritis, encouraged to reduce to once a week due to kidney issues.   Consult Visit: 02/08/20 admission: for PPM generator change-out 02/02/20 Dr Allred (cardiology): schedule PPM pulse generator replacement. No med changes.  05/25/19 Renee Lynn PA, cardiology: annual EP visit, pt is asymptomatic, no med changes. Pt encouraged to visit AHA website for tools/videos on healthy eating.   Allergies  Allergen Reactions  . Amoxicillin Diarrhea and Nausea And Vomiting  . Ciprofloxacin Diarrhea  . Jardiance [Empagliflozin]     UTI, sickness   . Ramipril Cough  . Tramadol Hcl     lightheaded   Medications: Outpatient Encounter Medications as of 02/15/2020  Medication Sig  . acetaminophen (TYLENOL ARTHRITIS PAIN) 650 MG CR tablet Take 650 mg by mouth at bedtime. arthritis  . Ascorbic Acid (VITAMIN C PO) Take 1 tablet by mouth daily.  . cetirizine (ZYRTEC)  10 MG tablet Take 10 mg by mouth at bedtime.   . Cholecalciferol (VITAMIN D) 50 MCG (2000 UT) tablet Take 4,000 Units by mouth daily.  . FLUoxetine (PROZAC) 10 MG capsule TAKE (1) CAPSULE DAILY. (Patient taking differently: Take 10 mg by mouth daily. )  . glimepiride (AMARYL) 4 MG tablet TAKE 1 TABLET EVERY DAY WITH BREAKFAST. (Patient taking differently: Take 4 mg by mouth daily with breakfast. )  . hydrochlorothiazide (MICROZIDE) 12.5 MG capsule TAKE (1) CAPSULE DAILY. (Patient taking differently: Take 12.5 mg by mouth daily. )  . losartan (COZAAR) 100 MG tablet TAKE 1 TABLET ONCE DAILY. (Patient taking differently: Take 100 mg by mouth daily. )  . Melatonin 5 MG CAPS Take 5 mg by mouth at bedtime as needed (sleep).  . metFORMIN (GLUCOPHAGE) 500 MG tablet TAKE 2 TABLETS TWICE DAILY WITH MEALS. (Patient taking differently: Take 1,000 mg by mouth 2 (two) times daily with a meal. )  . Multiple Vitamin (MULTIVITAMIN) tablet Take 1 tablet by mouth daily.    . Naphazoline-Pheniramine (OPCON-A OP) Place 1 drop into both eyes daily as needed (irritation/redness).  . naproxen sodium (ALEVE) 220 MG tablet Take 220 mg by mouth daily as needed (shoulder pain).  . ONETOUCH ULTRA test strip CHECK BLOOD SUGAR TWICE DAILY.  . pravastatin (PRAVACHOL) 20 MG tablet TAKE 1 TABLET ONCE DAILY. (Patient taking differently: Take 20 mg by mouth daily. )  . triamcinolone (NASACORT ALLERGY 24HR) 55 MCG/ACT AERO nasal inhaler Place 2 sprays into the nose at bedtime as needed (  congestion).  . warfarin (COUMADIN) 2 MG tablet TAKE 1/2 TABLET DAILY EXCEPT TAKE 1 TABLET ON MONDAY OR TAKE AS DIRECTED. (Patient taking differently: Take 1-2 mg by mouth See admin instructions. Take 2 mg after dinner on Monday, take 1 mg after dinner on all other days of the week)   No facility-administered encounter medications on file as of 02/15/2020.   Wt Readings from Last 3 Encounters:  02/08/20 198 lb (89.8 kg)  02/02/20 200 lb 6.4 oz (90.9  kg)  11/06/19 195 lb (88.5 kg)   Current Diagnosis/Assessment:  SDOH Interventions     Most Recent Value  SDOH Interventions  Financial Strain Interventions Intervention Not Indicated     Goals Addressed            This Visit's Progress   . Pharmacy Care Plan   On track    CARE PLAN ENTRY (see longitudinal plan of care for additional care plan information)  Current Barriers:  . Chronic Disease Management support, education, and care coordination needs related to Hypertension, Hyperlipidemia, Diabetes, and Atrial Fibrillation   Hypertension BP Readings from Last 3 Encounters:  02/08/20 (!) 170/92  02/02/20 (!) 144/68  11/06/19 126/62 .  Pharmacist Clinical Goal(s): o Over the next 90 days, patient will work with PharmD and providers to maintain BP goal <130/80 . Current regimen:  o Losartan 100 mg daily o HCTZ 12.5 mg daily . Interventions: o Discussed BP goals and benefits of medications for prevention of heart attack / stroke . Patient self care activities - Over the next 90 days, patient will: o Check BP weekly, document, and provide at future appointments o Ensure daily salt intake < 2300 mg/day  Hyperlipidemia Lab Results  Component Value Date/Time   LDLCALC 84 11/06/2019 11:54 AM   LDLDIRECT 70.0 05/08/2019 12:18 PM .  Pharmacist Clinical Goal(s): o Over the next 90 days, patient will work with PharmD and providers to maintain LDL goal < 100 . Current regimen:  o Pravastatin 20 mg daily . Interventions: o Discussed cholesterol goals and benefits of medications for prevention of heart attack / stroke . Patient self care activities - Over the next 90 days, patient will: o Continue current medications  Diabetes Lab Results  Component Value Date/Time   HGBA1C 7.7 (H) 11/06/2019 11:54 AM   HGBA1C 7.8 (H) 05/08/2019 12:18 PM .  Pharmacist Clinical Goal(s): o Over the next 90 days, patient will work with PharmD and providers to achieve A1c goal <7% . Current  regimen:  o metformin 1000 mg twice a day o glimepiride 4 mg daily in AM . Interventions: o Discussed A1c goals and benefits of medications for prevention of diabetic complications . Patient self care activities - Over the next 90 days, patient will: o Check blood sugar 2-3 times weekly document, and provide at future appointments o Contact provider with any episodes of hypoglycemia  Atrial fibrillation . Pharmacist Clinical Goal(s) o Over the next 90 days, patient will work with PharmD and providers to optimize therapy . Current regimen:  o Warfarin 2 mg as directed . Interventions: o Discussed importance of consistent Vitamin K intake (leafy, green vegetables) . Patient self care activities - Over the next 90 days, patient will: o Continue current medication o Follow up with Coumadin RN as scheduled  Medication management . Pharmacist Clinical Goal(s): o Over the next 90 days, patient will work with PharmD and providers to maintain optimal medication adherence . Current pharmacy: [facility] . Interventions o Comprehensive medication  review performed. o Continue current medication management strategy . Patient self care activities - Over the next 90 days, patient will: o Focus on medication adherence by pill box o Take medications as prescribed o Report any questions or concerns to PharmD and/or provider(s)  Please see past updates related to this goal by clicking on the "Past Updates" button in the selected goal     . COMPLETED: Reduce Coke       Pt will not buy more coke for next 30 days    . Restart CPAP   Not on track    Pt would like to get back in habit of using CPAP       AFIB   Complete heart block w/ PPM  Patient is currently rate controlled. Office heart rates are  Pulse Readings from Last 3 Encounters:  02/08/20 67  02/02/20 (!) 54  11/06/19 70   CHA2DS2-VASc Score = 4  The patient's score is based upon: CHF History: 0 HTN History:  1 Diabetes History: 1 Stroke History: 0 Vascular Disease History: 0 Age Score: 2 Gender Score: 0  Lab Results  Component Value Date/Time   INR 2.1 (H) 02/08/2020 11:44 AM   INR 2.3 (H) 02/02/2020 12:08 PM   INR 2.4 07/07/2010 09:26 AM   INR 3.1 01/06/2010 08:39 AM   Patient has failed these meds in past: n/a Patient is currently controlled on the following medications:  . Warfarin 2 mg as directed  We discussed:  diet and exercise extensively ; role of Vitamin K in warfarin efficacy;   Plan  Continue current medications and control with diet and exercise   Diabetes   A1c goal < 7%  Recent Relevant Labs: Lab Results  Component Value Date/Time   HGBA1C 7.7 (H) 11/06/2019 11:54 AM   HGBA1C 7.8 (H) 05/08/2019 12:18 PM   MICROALBUR 2.4 (H) 01/30/2016 12:19 PM   MICROALBUR 2.0 (H) 07/30/2015 09:03 AM    Last diabetic Foot exam:  Lab Results  Component Value Date/Time   HMDIABEYEEXA No Retinopathy 01/25/2019 12:00 AM    Last diabetic Eye exam: No results found for: HMDIABFOOTEX   Checking BG: Rarely.  Patient has failed these meds in past: Jardiance (UTI/diarrhea), repaglinide Patient is currently uncontrolled on the following medications:   metformin 1000 mg BID  glimepiride 4 mg qAM  We discussed: diet and exercise extensively; pt has completely cut out regular sodas and switched to diet, caffeine-free mountain dew. He could not afford Rybelsus when it was prescribed in July. Per [employer] formulary, Rybelsus is a preferred product for $45/month (outside of donut hole). Patient assistance may be an option if pt wants to retry this med in the future.  Plan Continue current medications and control with diet and exercise  Consider patient assistance for Rybelsus if pt would like to try it in the future  Hypertension   BP goal is:  <140/90  Office blood pressures are  BP Readings from Last 3 Encounters:  02/08/20 (!) 170/92  02/02/20 (!) 144/68  11/06/19 126/62    Patient checks BP at home several times per month Patient home BP readings are ranging: SBP 130s-150  Patient has failed these meds in the past: ramipril (cough) Patient is currently controlled on the following medications:  . Losartan 100 mg daily . HCTZ 12.5 mg daily  We discussed diet and exercise extensively  Plan  Continue current medications and control with diet and exercise   Hyperlipidemia   LDL goal < 100    Lipid Panel     Component Value Date/Time   CHOL 172 11/06/2019 1154   TRIG 371 (H) 11/06/2019 1154   HDL 43 11/06/2019 1154   CHOLHDL 4.0 11/06/2019 1154   VLDL 61.4 (H) 05/08/2019 1218   LDLCALC 84 11/06/2019 1154   LDLDIRECT 70.0 05/08/2019 1218   Hepatic Function Latest Ref Rng & Units 11/06/2019 05/08/2019 11/03/2018  Total Protein 6.1 - 8.1 g/dL 6.6 6.9 6.9  Albumin 3.5 - 5.2 g/dL - 4.3 4.4  AST 10 - 35 U/L 21 24 25  ALT 9 - 46 U/L 20 21 24  Alk Phosphatase 39 - 117 U/L - 35(L) 37(L)  Total Bilirubin 0.2 - 1.2 mg/dL 0.8 0.8 0.8  Bilirubin, Direct 0.0 - 0.3 mg/dL - - -   The 10-year ASCVD risk score (Goff DC Jr., et al., 2013) is: 68.4%   Values used to calculate the score:     Age: 78 years     Sex: Male     Is Non-Hispanic African American: No     Diabetic: Yes     Tobacco smoker: No     Systolic Blood Pressure: 170 mmHg     Is BP treated: Yes     HDL Cholesterol: 43 mg/dL     Total Cholesterol: 172 mg/dL  Patient has failed these meds in past: fenofibrate Patient is currently uncontrolled on the following medications:   pravastatin 20 mg daily  We discussed:  diet and exercise extensively, Cholesterol goals; benefits of statin for ASCVD risk reduction  Plan Continue current medications and control with diet and exercise   Medication Management   Pt uses [facility] for all medications Uses pill box? Yes Pt endorses 100% compliance  We discussed: Current pharmacy is preferred with insurance plan and patient is satisfied with  pharmacy services  Plan  Continue current medication management strategy    Follow up: 4 month phone visit  Lindsey Foltanski, PharmD, BCACP Clinical Pharmacist [facility] [phone] 

## 2020-02-20 ENCOUNTER — Other Ambulatory Visit: Payer: Self-pay

## 2020-02-20 ENCOUNTER — Ambulatory Visit (INDEPENDENT_AMBULATORY_CARE_PROVIDER_SITE_OTHER): Payer: PPO | Admitting: Emergency Medicine

## 2020-02-20 DIAGNOSIS — I442 Atrioventricular block, complete: Secondary | ICD-10-CM | POA: Diagnosis not present

## 2020-02-20 LAB — CUP PACEART INCLINIC DEVICE CHECK
Battery Remaining Longevity: 135 mo
Battery Voltage: 3.16 V
Brady Statistic RA Percent Paced: 0 %
Brady Statistic RV Percent Paced: 88 %
Date Time Interrogation Session: 20211026123410
Implantable Pulse Generator Implant Date: 20211014
Lead Channel Impedance Value: 475 Ohm
Lead Channel Pacing Threshold Amplitude: 0.75 V
Lead Channel Pacing Threshold Pulse Width: 0.5 ms
Lead Channel Sensing Intrinsic Amplitude: 11.4 mV
Lead Channel Setting Pacing Amplitude: 1 V
Lead Channel Setting Pacing Pulse Width: 0.5 ms
Lead Channel Setting Sensing Sensitivity: 2 mV
Pulse Gen Model: 2272
Pulse Gen Serial Number: 3873301

## 2020-02-20 NOTE — Progress Notes (Signed)
Wound check appointment. Steri-strips removed. Wound without redness or edema. Incision edges approximated, wound well healed. Normal device function. Thresholds, sensing, and impedances consistent with implant measurements. Device programmed auto capture, mature leads. Histogram distribution appropriate for patient and level of activity. No high ventricular rates noted. Patient educated about wound care, arm mobility, lifting restrictions. ROV in 3 months with Dr. Rayann Heman.

## 2020-02-28 ENCOUNTER — Other Ambulatory Visit: Payer: Self-pay | Admitting: Internal Medicine

## 2020-03-19 ENCOUNTER — Ambulatory Visit (INDEPENDENT_AMBULATORY_CARE_PROVIDER_SITE_OTHER): Payer: PPO | Admitting: General Practice

## 2020-03-19 ENCOUNTER — Other Ambulatory Visit: Payer: Self-pay

## 2020-03-19 DIAGNOSIS — Z7901 Long term (current) use of anticoagulants: Secondary | ICD-10-CM

## 2020-03-19 DIAGNOSIS — I4891 Unspecified atrial fibrillation: Secondary | ICD-10-CM | POA: Diagnosis not present

## 2020-03-19 LAB — POCT INR: INR: 2.1 (ref 2.0–3.0)

## 2020-03-19 NOTE — Progress Notes (Signed)
Agree with management.  Jerad Dunlap J Justen Fonda, MD  

## 2020-03-19 NOTE — Patient Instructions (Signed)
Pre visit review using our clinic review tool, if applicable. No additional management support is needed unless otherwise documented below in the visit note.  Change dosage and take 1/2 tablet daily except 1 tablet on Mon Wed and Fridays.  Re-check in 6 weeks.

## 2020-05-03 ENCOUNTER — Ambulatory Visit: Payer: PPO | Attending: Internal Medicine

## 2020-05-03 DIAGNOSIS — Z23 Encounter for immunization: Secondary | ICD-10-CM

## 2020-05-03 NOTE — Progress Notes (Signed)
   Covid-19 Vaccination Clinic  Name:  Albert Hawkins    MRN: [id number] DOB: August 10, 1940  05/03/2020  [name] was observed post Covid-19 immunization for 15 minutes without incident. He was provided with Vaccine Information Sheet and instruction to access the V-Safe system.   [name] was instructed to call 911 with any severe reactions post vaccine: Marland Kitchen Difficulty breathing  . Swelling of face and throat  . A fast heartbeat  . A bad rash all over body  . Dizziness and weakness   Immunizations Administered    Name Date Dose VIS Date Route   Pfizer COVID-19 Vaccine 05/03/2020  1:56 PM 0.3 mL 02/14/2020 Intramuscular   Manufacturer: [place]   Lot: [id number]   NDC: [address]-3

## 2020-05-07 ENCOUNTER — Other Ambulatory Visit: Payer: Self-pay

## 2020-05-07 ENCOUNTER — Ambulatory Visit (INDEPENDENT_AMBULATORY_CARE_PROVIDER_SITE_OTHER): Payer: PPO | Admitting: General Practice

## 2020-05-07 DIAGNOSIS — I4891 Unspecified atrial fibrillation: Secondary | ICD-10-CM | POA: Diagnosis not present

## 2020-05-07 DIAGNOSIS — Z7901 Long term (current) use of anticoagulants: Secondary | ICD-10-CM | POA: Diagnosis not present

## 2020-05-07 LAB — POCT INR: INR: 2.9 (ref 2.0–3.0)

## 2020-05-07 NOTE — Progress Notes (Signed)
Agree with management.  Calob Baskette J Johanna Matto, MD  

## 2020-05-07 NOTE — Patient Instructions (Addendum)
Pre visit review using our clinic review tool, if applicable. No additional management support is needed unless otherwise documented below in the visit note.  Continue to take 1/2 tablet daily except 1 tablet on Mon Wed and Fridays.  Re-check in 6 weeks.  

## 2020-05-13 ENCOUNTER — Encounter: Payer: PPO | Admitting: Internal Medicine

## 2020-05-20 ENCOUNTER — Ambulatory Visit (INDEPENDENT_AMBULATORY_CARE_PROVIDER_SITE_OTHER): Payer: PPO

## 2020-05-20 DIAGNOSIS — I442 Atrioventricular block, complete: Secondary | ICD-10-CM

## 2020-05-20 LAB — CUP PACEART REMOTE DEVICE CHECK
Battery Remaining Longevity: 134 mo
Battery Remaining Percentage: 95.5 %
Battery Voltage: 3.04 V
Brady Statistic RV Percent Paced: 89 %
Date Time Interrogation Session: 20220124021023
Implantable Pulse Generator Implant Date: 20211014
Lead Channel Impedance Value: 430 Ohm
Lead Channel Pacing Threshold Amplitude: 1.125 V
Lead Channel Pacing Threshold Pulse Width: 0.5 ms
Lead Channel Sensing Intrinsic Amplitude: 12 mV
Lead Channel Setting Pacing Amplitude: 1.375
Lead Channel Setting Pacing Pulse Width: 0.5 ms
Lead Channel Setting Sensing Sensitivity: 2 mV
Pulse Gen Model: 2272
Pulse Gen Serial Number: 3873301

## 2020-05-24 ENCOUNTER — Other Ambulatory Visit: Payer: Self-pay | Admitting: Internal Medicine

## 2020-05-27 ENCOUNTER — Ambulatory Visit: Payer: PPO | Admitting: Internal Medicine

## 2020-05-27 ENCOUNTER — Encounter: Payer: Self-pay | Admitting: Internal Medicine

## 2020-05-27 ENCOUNTER — Other Ambulatory Visit: Payer: Self-pay

## 2020-05-27 VITALS — BP 126/64 | HR 59 | Ht 70.0 in | Wt 198.2 lb

## 2020-05-27 DIAGNOSIS — I4821 Permanent atrial fibrillation: Secondary | ICD-10-CM | POA: Diagnosis not present

## 2020-05-27 DIAGNOSIS — I442 Atrioventricular block, complete: Secondary | ICD-10-CM | POA: Diagnosis not present

## 2020-05-27 DIAGNOSIS — D6869 Other thrombophilia: Secondary | ICD-10-CM | POA: Diagnosis not present

## 2020-05-27 LAB — CUP PACEART INCLINIC DEVICE CHECK
Battery Remaining Longevity: 130 mo
Battery Voltage: 3.04 V
Brady Statistic RA Percent Paced: 0 %
Brady Statistic RV Percent Paced: 89 %
Date Time Interrogation Session: 20220131110728
Implantable Lead Implant Date: 20100216
Implantable Lead Implant Date: 20100216
Implantable Lead Location: 753859
Implantable Lead Location: 753860
Implantable Pulse Generator Implant Date: 20211014
Lead Channel Impedance Value: 475 Ohm
Lead Channel Pacing Threshold Amplitude: 0.875 V
Lead Channel Pacing Threshold Pulse Width: 0.5 ms
Lead Channel Sensing Intrinsic Amplitude: 10.6 mV
Lead Channel Setting Pacing Amplitude: 1.125
Lead Channel Setting Pacing Pulse Width: 0.5 ms
Lead Channel Setting Sensing Sensitivity: 2 mV
Pulse Gen Model: 2272
Pulse Gen Serial Number: 3873301

## 2020-05-27 NOTE — Progress Notes (Signed)
PCP: Binnie Rail, MD   Primary EP:  Dr Dierdre Forth is a 80 y.o. male who presents today for routine electrophysiology followup.  Since his recent pacemaker generator change, the patient reports doing very well.  Today, he denies symptoms of palpitations, chest pain, shortness of breath,  lower extremity edema, dizziness, presyncope, or syncope.  The patient is otherwise without complaint today.   Past Medical History:  Diagnosis Date  . Atrial fibrillation (HCC)    paroxysmal, on coumadin  . Benign essential HTN   . Diabetes mellitus type II   . Hearing loss, conductive, bilateral   . Hyperlipidemia   . 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 Mobitz (type) II atrioventricular block    S/P  PPM (SJM) by Dr Rayann Heman  . Neoplasm of uncertain behavior of skin   . PVC's (premature ventricular contractions)   . 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 Sleep disorder    Past Surgical History:  Procedure Laterality Date  . 3 fatty tumors     benign  . HAND SURGERY     Right thumb amputated and reattached   . HERNIA REPAIR     Umbilical  . no colonoscopy     "did not want one" (warfarin goes against it; 11/09/13)  . PACEMAKER PLACEMENT     06/12/2008, Dr. Thompson Grayer  . PPM GENERATOR CHANGEOUT N/A 02/08/2020   Procedure: PPM GENERATOR CHANGEOUT;  Surgeon: Thompson Grayer, MD;  Location: [facility];  Service: Cardiovascular;  Laterality: N/A;  . SKIN CANCER EXCISION     above nose     ROS- all systems are reviewed and negative except as per HPI above  Current Outpatient Medications  Medication Sig Dispense Refill  . acetaminophen (TYLENOL) 650 MG CR tablet Take 650 mg by mouth at bedtime. arthritis    . Ascorbic Acid (VITAMIN C PO) Take 1 tablet by mouth daily.    . cetirizine (ZYRTEC) 10 MG tablet Take 10 mg by mouth at bedtime.    . Cholecalciferol (VITAMIN D) 50 MCG (2000 UT) tablet Take 4,000  Units by mouth daily.    Marland Kitchen FLUoxetine (PROZAC) 10 MG capsule TAKE (1) CAPSULE DAILY. 90 capsule 0  . glimepiride (AMARYL) 4 MG tablet TAKE 1 TABLET EVERY DAY WITH BREAKFAST. 90 tablet 0  . hydrochlorothiazide (MICROZIDE) 12.5 MG capsule TAKE (1) CAPSULE DAILY. 90 capsule 0  . losartan (COZAAR) 100 MG tablet TAKE 1 TABLET ONCE DAILY. 90 tablet 0  . Melatonin 5 MG CAPS Take 5 mg by mouth at bedtime as needed (sleep).    . metFORMIN (GLUCOPHAGE) 500 MG tablet TAKE 2 TABLETS TWICE DAILY WITH MEALS. 360 tablet 0  . Multiple Vitamin (MULTIVITAMIN) tablet Take 1 tablet by mouth daily.    . Naphazoline-Pheniramine (OPCON-A OP) Place 1 drop into both eyes daily as needed (irritation/redness).    . naproxen sodium (ALEVE) 220 MG tablet Take 220 mg by mouth daily as needed (shoulder pain).    Glory Rosebush ULTRA test strip CHECK BLOOD SUGAR TWICE DAILY. 100 strip 0  . pravastatin (PRAVACHOL) 20 MG tablet TAKE 1 TABLET ONCE DAILY. 90 tablet 0  . triamcinolone (NASACORT) 55 MCG/ACT AERO nasal inhaler Place 2 sprays into the nose at bedtime as needed (congestion).    . warfarin (COUMADIN) 2 MG tablet TAKE 1/2 TABLET DAILY EXCEPT TAKE 1 TABLET ON MONDAY  OR TAKE AS DIRECTED. (Patient taking differently: Take 1-2 mg by mouth See admin instructions. Take 2 mg after dinner on Monday, take 1 mg after dinner on all other days of the week) 75 tablet 0   No current facility-administered medications for this visit.    Physical Exam: Vitals:   05/27/20 1030  BP: 126/64  Pulse: (!) 59  SpO2: 98%  Weight: 198 lb 3.2 oz (89.9 kg)  Height: 5\' 10"  (1.778 m)    GEN- The patient is well appearing, alert and oriented x 3 today.   Head- normocephalic, atraumatic Eyes-  Sclera clear, conjunctiva pink Ears- hearing intact Oropharynx- clear Lungs-   normal work of breathing Chest- pacemaker pocket is well healed Heart- Regular rate and rhythm (V paced) GI- sof  Extremities- no clubbing, cyanosis, or edema  Pacemaker  interrogation- reviewed in detail today,  See PACEART report  ekg tracing ordered today is personally reviewed and shows afib, V paced  Assessment and Plan:  1. Symptomatic complete heart block Normal pacemaker function See Pace Art report No changes today he is not device dependant today  2. HTN Stable No change required today  3. Moderate MR Stable No change required today  4. Permanent afib Rate controlled On coumadin  Risks, benefits and potential toxicities for medications prescribed and/or refilled reviewed with patient today.   Return to see EP PA annually  I will see when needed  Thompson Grayer MD, [facility] 05/27/2020 10:35 AM

## 2020-05-27 NOTE — Patient Instructions (Signed)
Medication Instructions:  Your physician recommends that you continue on your current medications as directed. Please refer to the Current Medication list given to you today.  *If you need a refill on your cardiac medications before your next appointment, please call your pharmacy*   Lab Work: None ordered If you have labs (blood work) drawn today and your tests are completely normal, you will receive your results only by: Marland Kitchen MyChart Message (if you have MyChart) OR . A paper copy in the mail If you have any lab test that is abnormal or we need to change your treatment, we will call you to review the results.   Testing/Procedures: None ordered   Follow-Up: At [facility], you and your health needs are our priority.  As part of our continuing mission to provide you with exceptional heart care, we have created designated Provider Care Teams.  These Care Teams include your primary Cardiologist (physician) and Advanced Practice Providers (APPs -  Physician Assistants and Nurse Practitioners) who all work together to provide you with the care you need, when you need it.  We recommend signing up for the patient portal called "MyChart".  Sign up information is provided on this After Visit Summary.  MyChart is used to connect with patients for Virtual Visits (Telemedicine).  Patients are able to view lab/test results, encounter notes, upcoming appointments, etc.  Non-urgent messages can be sent to your provider as well.   To learn more about what you can do with MyChart, go to [email or web address].    Your next appointment:   12 month(s)  The format for your next appointment:   In Person  Provider:   You will see one of the following Advanced Practice Providers on your designated Care Team:     Tommye Standard, [place]    Thank you for choosing [facility]!!    Other Instructions

## 2020-05-30 NOTE — Progress Notes (Signed)
Remote pacemaker transmission.   

## 2020-06-02 ENCOUNTER — Other Ambulatory Visit: Payer: Self-pay | Admitting: Internal Medicine

## 2020-06-02 DIAGNOSIS — Z7901 Long term (current) use of anticoagulants: Secondary | ICD-10-CM

## 2020-06-12 ENCOUNTER — Telehealth: Payer: PPO

## 2020-06-12 NOTE — Progress Notes (Deleted)
Chronic Care Management Pharmacy Note  06/12/2020 Name:  ICHOLAS IRBY MRN:  [id number] DOB:  1940/08/28  Subjective: MARKS SCALERA is an 80 y.o. year old male who is a primary patient of Burns, Claudina Lick, MD.  The CCM team was consulted for assistance with disease management and care coordination needs.    Engaged with patient by telephone for follow up visit in response to provider referral for pharmacy case management and/or care coordination services.   Consent to Services:  The patient was given the following information about Chronic Care Management services today, agreed to services, and gave verbal consent: 1. CCM service includes personalized support from designated clinical staff supervised by the primary care provider, including individualized plan of care and coordination with other care providers 2. 24/7 contact phone numbers for assistance for urgent and routine care needs. 3. Service will only be billed when office clinical staff spend 20 minutes or more in a month to coordinate care. 4. Only one practitioner may furnish and bill the service in a calendar month. 5.The patient may stop CCM services at any time (effective at the end of the month) by phone call to the office staff. 6. The patient will be responsible for cost sharing (co-pay) of up to 20% of the service fee (after annual deductible is met). Patient agreed to services and consent obtained.  Patient Care Team: Binnie Rail, MD as PCP - General (Internal Medicine) Charlton Haws, [facility] (Pharmacist)  Recent office visits: 11/06/19 Dr Quay Burow OV: change glimepride to [place]. Pt never started due to cost.  Recent consult visits: 05/27/20 Dr Rayann Heman (cardiology): f/u afib, no med changes. Normal PPM.  Hospital visits: 02/08/20 admission: for PPM generator change-out  Objective:  Lab Results  Component Value Date   CREATININE 1.31 (H) 02/02/2020   BUN 16 02/02/2020   GFR 50.49 (L) 05/08/2019   GFRNONAA 53  (L) 02/02/2020   GFRAA 61 02/02/2020   NA 138 02/02/2020   K 4.3 02/02/2020   CALCIUM 9.7 02/02/2020   CO2 24 02/02/2020    Lab Results  Component Value Date/Time   HGBA1C 7.7 (H) 11/06/2019 11:54 AM   HGBA1C 7.8 (H) 05/08/2019 12:18 PM   GFR 50.49 (L) 05/08/2019 12:18 PM   GFR 47.35 (L) 11/09/2018 11:41 AM   MICROALBUR 2.4 (H) 01/30/2016 12:19 PM   MICROALBUR 2.0 (H) 07/30/2015 09:03 AM    Last diabetic Eye exam:  Lab Results  Component Value Date/Time   HMDIABEYEEXA No Retinopathy 01/25/2019 12:00 AM    Last diabetic Foot exam: No results found for: HMDIABFOOTEX   Lab Results  Component Value Date   CHOL 172 11/06/2019   HDL 43 11/06/2019   LDLCALC 84 11/06/2019   LDLDIRECT 70.0 05/08/2019   TRIG 371 (H) 11/06/2019   CHOLHDL 4.0 11/06/2019    Hepatic Function Latest Ref Rng & Units 11/06/2019 05/08/2019 11/03/2018  Total Protein 6.1 - 8.1 g/dL 6.6 6.9 6.9  Albumin 3.5 - 5.2 g/dL - 4.3 4.4  AST 10 - 35 U/L '21 24 25  ' ALT 9 - 46 U/L '20 21 24  ' Alk Phosphatase 39 - 117 U/L - 35(L) 37(L)  Total Bilirubin 0.2 - 1.2 mg/dL 0.8 0.8 0.8  Bilirubin, Direct 0.0 - 0.3 mg/dL - - -    Lab Results  Component Value Date/Time   TSH 1.95 05/05/2018 11:36 AM   TSH 2.06 07/26/2017 11:54 AM   FREET4 1.1 01/16/2008 09:02 AM    CBC Latest Ref  Rng & Units 02/02/2020 11/06/2019 05/08/2019  WBC 3.4 - 10.8 x10E3/uL 7.9 8.6 7.3  Hemoglobin 13.0 - 17.7 g/dL 14.7 15.5 14.4  Hematocrit 37.5 - 51.0 % 43.5 47.8 42.7  Platelets 150 - 450 x10E3/uL 192 204 181.0   Lab Results  Component Value Date/Time   INR 2.9 05/07/2020 12:00 AM   INR 2.1 03/19/2020 12:00 AM   INR 2.1 (H) 02/08/2020 11:44 AM   INR 2.3 (H) 02/02/2020 12:08 PM   INR 2.4 07/07/2010 09:26 AM   INR 3.1 01/06/2010 08:39 AM   No results found for: VD25OH  Clinical ASCVD: No  The 10-year ASCVD risk score Mikey Bussing DC Jr., et al., 2013) is: 51.3%   Values used to calculate the score:     Age: 5 years     Sex: Male     Is  Non-Hispanic African American: No     Diabetic: Yes     Tobacco smoker: No     Systolic Blood Pressure: 782 mmHg     Is BP treated: Yes     HDL Cholesterol: 43 mg/dL     Total Cholesterol: 172 mg/dL    Depression screen [facility] 2/9 11/06/2019 08/24/2019 05/08/2019  Decreased Interest 0 0 0  Down, Depressed, Hopeless 0 0 0  PHQ - 2 Score 0 0 0  Altered sleeping 0 - 0  Tired, decreased energy 1 - 1  Change in appetite 0 - 1  Feeling bad or failure about yourself  0 - 0  Trouble concentrating 0 - 0  Moving slowly or fidgety/restless 0 - 0  Suicidal thoughts 0 - 0  PHQ-9 Score 1 - 2  Difficult doing work/chores Not difficult at all - -  Some recent data might be hidden     Social History   Tobacco Use  Smoking Status Former Smoker  . Packs/day: 2.00  . Years: 10.00  . Pack years: 20.00  . Types: Cigarettes  . Quit date: 04/27/1985  . Years since quitting: 35.1  Smokeless Tobacco Never Used  Tobacco Comment   smoked age [age], maximum regular consumption up to 1 ppd   BP Readings from Last 3 Encounters:  05/27/20 126/64  02/08/20 (!) 170/92  02/02/20 (!) 144/68   Pulse Readings from Last 3 Encounters:  05/27/20 (!) 59  02/08/20 67  02/02/20 (!) 54   Wt Readings from Last 3 Encounters:  05/27/20 198 lb 3.2 oz (89.9 kg)  02/08/20 198 lb (89.8 kg)  02/02/20 200 lb 6.4 oz (90.9 kg)    Assessment/Interventions: Review of patient past medical history, allergies, medications, health status, including review of consultants reports, laboratory and other test data, was performed as part of comprehensive evaluation and provision of chronic care management services.   SDOH:  (Social Determinants of Health) assessments and interventions performed: Yes   CCM Care Plan  Allergies  Allergen Reactions  . Amoxicillin Diarrhea and Nausea And Vomiting  . Ciprofloxacin Diarrhea  . Jardiance [Empagliflozin]     UTI, sickness   . Ramipril Cough  . Tramadol Hcl     lightheaded     Medications Reviewed Today    Reviewed by Rose Phi, CMA (Certified Medical Assistant) on 05/27/20 at 32  Med List Status: <None>  Medication Order Taking? Sig Documenting Provider Last Dose Status Informant  acetaminophen (TYLENOL) 650 MG CR tablet [id number] Yes Take 650 mg by mouth at bedtime. arthritis [provider] Taking Active Self  Ascorbic Acid (VITAMIN C PO) [id number] Yes Take  1 tablet by mouth daily. [provider] Taking Active Self  cetirizine (ZYRTEC) 10 MG tablet [id number] Yes Take 10 mg by mouth at bedtime. [provider] Taking Active Self  Cholecalciferol (VITAMIN D) 50 MCG (2000 UT) tablet [id number] Yes Take 4,000 Units by mouth daily. [provider] Taking Active Self  FLUoxetine (PROZAC) 10 MG capsule [id number] Yes TAKE (1) CAPSULE DAILY. Binnie Rail, MD Taking Active   glimepiride (AMARYL) 4 MG tablet [id number] Yes TAKE 1 TABLET EVERY DAY WITH BREAKFAST. Binnie Rail, MD Taking Active   hydrochlorothiazide (MICROZIDE) 12.5 MG capsule [id number] Yes TAKE (1) CAPSULE DAILY. Binnie Rail, MD Taking Active   losartan (COZAAR) 100 MG tablet [id number] Yes TAKE 1 TABLET ONCE DAILY. Binnie Rail, MD Taking Active   Melatonin 5 MG CAPS [id number] Yes Take 5 mg by mouth at bedtime as needed (sleep). [provider] Taking Active Self  metFORMIN (GLUCOPHAGE) 500 MG tablet [id number] Yes TAKE 2 TABLETS TWICE DAILY WITH MEALS. Binnie Rail, MD Taking Active   Multiple Vitamin (MULTIVITAMIN) tablet [id number] Yes Take 1 tablet by mouth daily. [provider] Taking Active Self           Med Note Mechele Dawley   Tue Oct 24, 2019 11:18 AM)    Mable Fill (OPCON-A OP) [id number] Yes Place 1 drop into both eyes daily as needed (irritation/redness). [provider] Taking Active Self  naproxen sodium (ALEVE) 220 MG tablet [id number] Yes Take 220 mg by mouth daily as needed (shoulder pain). [provider] Taking Active Self  Doron Siva test strip [id number] Yes CHECK BLOOD SUGAR TWICE DAILY. Binnie Rail, MD Taking Active   pravastatin (PRAVACHOL) 20 MG tablet [id number] Yes TAKE 1 TABLET ONCE DAILY. Binnie Rail, MD Taking Active   triamcinolone (NASACORT) 55 MCG/ACT AERO nasal inhaler [id number] Yes Place 2 sprays into the nose at bedtime as needed (congestion). [provider] Taking Active Self  warfarin (COUMADIN) 2 MG tablet [id number] Yes TAKE 1/2 TABLET DAILY EXCEPT TAKE 1 TABLET ON MONDAY OR TAKE AS DIRECTED.  Patient taking differently: Take 1-2 mg by mouth See admin instructions. Take 2 mg after dinner on Monday, take 1 mg after dinner on all other days of the week   Binnie Rail, MD Taking Active Self          Patient Active Problem List   Diagnosis Date Noted  . Reactive airway disease 12/29/2017  . Urinary frequency 12/16/2017  . CKD (chronic kidney disease) stage 3, GFR 30-59 ml/min (HCC) 07/23/2017  . Long term (current) use of anticoagulants 01/15/2017  . Obesity (BMI 30-39.9) 11/09/2013  . Encounter for therapeutic drug monitoring 06/06/2013  . Complete heart block ([place]) 03/03/2013  . OSA (obstructive sleep apnea) 03/28/2012  . 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 Periodic limb movement disorder (PLMD) 12/25/2011  . Depression 01/13/2010  . LUMBAR RADICULOPATHY 07/20/2008  . Second degree AV block, Mobitz type II 07/18/2008  . Essential hypertension 01/16/2008  . 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 Atrial fibrillation ([place]) 11/17/2007  . HEARING LOSS, CONDUCTIVE, BILATERAL 04/14/2007  . DM (diabetes mellitus), secondary, uncontrolled, with peripheral vascular complications ([place]) 77/02/6578  . Hyperlipidemia 03/17/2007  . 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 SKIN CANCER, HX OF 05/17/2006    Immunization History  Administered Date(s) Administered  . Fluad Quad(high Dose 65+) 02/17/2019, 02/06/2020  .  Influenza Split 02/12/2011, 02/02/2012  . Influenza Whole 02/25/2006, 04/14/2007, 02/16/2008,  02/18/2009, 02/03/2010  . Influenza, High Dose Seasonal PF 02/02/2014, 01/22/2015, 01/24/2016, 01/15/2017, 01/25/2018  . Influenza-Unspecified 01/25/2013  . PFIZER(Purple Top)SARS-COV-2 Vaccination 05/16/2019, 06/05/2019, 05/03/2020  . Pneumococcal Conjugate-13 12/04/2014  . Pneumococcal Polysaccharide-23 04/01/2009    Conditions to be addressed/monitored:  Hypertension, Hyperlipidemia, Diabetes, Atrial Fibrillation, Chronic Kidney Disease, Depression and BPH, Complete heart block w/ PPM  There are no care plans that you recently modified to display for this patient.    Medication Assistance: {MEDASSISTANCEINFO:25044}  Patient's preferred pharmacy is:  [facility], [place] [address] Phone: [phone] Fax: [phone]  Uses pill box? Yes Pt endorses 100% compliance  We discussed: Current pharmacy is preferred with insurance plan and patient is satisfied with pharmacy services Patient decided to: Continue current medication management strategy  Care Plan and Follow Up Patient Decision:  Patient agrees to Care Plan and Follow-up.  Plan: Telephone follow up appointment with care management team member scheduled for:  ***  Charlene Brooke, PharmD, BCACP Clinical Pharmacist [facility] [phone]      Current Barriers:  . {pharmacybarriers:24917} . ***  Pharmacist Clinical Goal(s):  Marland Kitchen Over the next *** days, patient will {PHARMACYGOALCHOICES:24921} through collaboration with PharmD and provider.  . ***  Interventions: . 1:1 collaboration with Binnie Rail, MD regarding development and update of comprehensive plan of care as evidenced by provider attestation and co-signature . Inter-disciplinary care team collaboration (see longitudinal plan of  care) . Comprehensive medication review performed; medication list updated in electronic medical record  Hypertension (BP goal < 130/80)  Current regimen:  ? Losartan 100 mg daily ? HCTZ 12.5 mg daily  Interventions: ? Discussed BP goals and benefits of medications for prevention of heart attack / stroke  Patient self care activities - Over the next 90 days, patient will: ? Check BP weekly, document, and provide at future appointments ? Ensure daily salt intake < 2300 mg/day  Hyperlipidemia (LDL goal < 100)  Current regimen:  ? Pravastatin 20 mg daily  Interventions: ? Discussed cholesterol goals and benefits of medications for prevention of heart attack / stroke  Patient self care activities - Over the next 90 days, patient will: ? Continue current medications  Diabetes (A1c goal < 7%)  Current regimen:  ? metformin 1000 mg twice a day ? glimepiride 4 mg daily in AM  Interventions: ? Discussed A1c goals and benefits of medications for prevention of diabetic complications  Patient self care activities - Over the next 90 days, patient will: ? Check blood sugar 2-3 times weekly document, and provide at future appointments ? Contact provider with any episodes of hypoglycemia  Atrial fibrillation  Current regimen:  ? Warfarin 2 mg as directed  Interventions: ? Discussed importance of consistent Vitamin K intake (leafy, green vegetables)  Patient self care activities - Over the next 90 days, patient will: ? Continue current medication ? Follow up with Coumadin RN as scheduled  Patient Goals/Self-Care Activities . Over the next *** days, patient will:  - {pharmacypatientgoals:24919}  Follow Up Plan: {CM FOLLOW UP WVPX:10626}

## 2020-06-18 ENCOUNTER — Other Ambulatory Visit: Payer: Self-pay

## 2020-06-18 ENCOUNTER — Ambulatory Visit (INDEPENDENT_AMBULATORY_CARE_PROVIDER_SITE_OTHER): Payer: PPO | Admitting: General Practice

## 2020-06-18 DIAGNOSIS — Z7901 Long term (current) use of anticoagulants: Secondary | ICD-10-CM | POA: Diagnosis not present

## 2020-06-18 DIAGNOSIS — I4891 Unspecified atrial fibrillation: Secondary | ICD-10-CM | POA: Diagnosis not present

## 2020-06-18 LAB — POCT INR: INR: 3 (ref 2.0–3.0)

## 2020-06-18 NOTE — Progress Notes (Signed)
Agree with management.  Jaiona Simien J Mescal Flinchbaugh, MD  

## 2020-06-18 NOTE — Patient Instructions (Addendum)
Pre visit review using our clinic review tool, if applicable. No additional management support is needed unless otherwise documented below in the visit note.  Continue to take 1/2 tablet daily except 1 tablet on Mon Wed and Fridays.  Re-check in 6 weeks.

## 2020-06-28 ENCOUNTER — Telehealth: Payer: PPO

## 2020-07-30 ENCOUNTER — Ambulatory Visit (INDEPENDENT_AMBULATORY_CARE_PROVIDER_SITE_OTHER): Payer: PPO | Admitting: General Practice

## 2020-07-30 ENCOUNTER — Other Ambulatory Visit: Payer: Self-pay

## 2020-07-30 DIAGNOSIS — Z7901 Long term (current) use of anticoagulants: Secondary | ICD-10-CM | POA: Diagnosis not present

## 2020-07-30 DIAGNOSIS — I4891 Unspecified atrial fibrillation: Secondary | ICD-10-CM

## 2020-07-30 LAB — POCT INR: INR: 4.2 — AB (ref 2.0–3.0)

## 2020-07-30 NOTE — Progress Notes (Signed)
Agree with management.  Albert Behring J [name], MD  

## 2020-07-30 NOTE — Patient Instructions (Addendum)
Pre visit review using our clinic review tool, if applicable. No additional management support is needed unless otherwise documented below in the visit note.  Hold coumadin today and tomorrow (4/5 and 4/6) and then change dosage and take 1/2 tablet daily except 1 tablet on Mon and Fridays.  Re-check in 3 weeks.

## 2020-08-19 ENCOUNTER — Ambulatory Visit (INDEPENDENT_AMBULATORY_CARE_PROVIDER_SITE_OTHER): Payer: PPO

## 2020-08-19 DIAGNOSIS — I442 Atrioventricular block, complete: Secondary | ICD-10-CM | POA: Diagnosis not present

## 2020-08-19 LAB — CUP PACEART REMOTE DEVICE CHECK
Battery Remaining Longevity: 133 mo
Battery Remaining Percentage: 95.5 %
Battery Voltage: 3.02 V
Brady Statistic RV Percent Paced: 91 %
Date Time Interrogation Session: 20220425020014
Implantable Lead Implant Date: 20100216
Implantable Lead Implant Date: 20100216
Implantable Lead Location: 753859
Implantable Lead Location: 753860
Implantable Pulse Generator Implant Date: 20211014
Lead Channel Impedance Value: 450 Ohm
Lead Channel Pacing Threshold Amplitude: 0.875 V
Lead Channel Pacing Threshold Pulse Width: 0.5 ms
Lead Channel Sensing Intrinsic Amplitude: 12 mV
Lead Channel Setting Pacing Amplitude: 1.125
Lead Channel Setting Pacing Pulse Width: 0.5 ms
Lead Channel Setting Sensing Sensitivity: 2 mV
Pulse Gen Model: 2272
Pulse Gen Serial Number: 3873301

## 2020-08-20 ENCOUNTER — Other Ambulatory Visit: Payer: Self-pay

## 2020-08-20 ENCOUNTER — Ambulatory Visit (INDEPENDENT_AMBULATORY_CARE_PROVIDER_SITE_OTHER): Payer: PPO | Admitting: General Practice

## 2020-08-20 DIAGNOSIS — I4891 Unspecified atrial fibrillation: Secondary | ICD-10-CM

## 2020-08-20 DIAGNOSIS — Z7901 Long term (current) use of anticoagulants: Secondary | ICD-10-CM

## 2020-08-20 LAB — POCT INR: INR: 2.9 (ref 2.0–3.0)

## 2020-08-20 NOTE — Progress Notes (Signed)
Agree with management.  Daegen Berrocal J Azhane Eckart, MD  

## 2020-08-20 NOTE — Patient Instructions (Addendum)
Pre visit review using our clinic review tool, if applicable. No additional management support is needed unless otherwise documented below in the visit note.  Hold coumadin today and tomorrow (4/5 and 4/6) and then change dosage and take 1/2 tablet daily except 1 tablet on Mon and Fridays.  Re-check in 5 weeks.

## 2020-08-22 ENCOUNTER — Other Ambulatory Visit: Payer: Self-pay | Admitting: Internal Medicine

## 2020-08-22 DIAGNOSIS — Z7901 Long term (current) use of anticoagulants: Secondary | ICD-10-CM

## 2020-09-04 NOTE — Progress Notes (Signed)
Remote pacemaker transmission.   

## 2020-09-05 ENCOUNTER — Other Ambulatory Visit: Payer: Self-pay | Admitting: Dermatology

## 2020-09-05 DIAGNOSIS — D225 Melanocytic nevi of trunk: Secondary | ICD-10-CM | POA: Diagnosis not present

## 2020-09-05 DIAGNOSIS — D0439 Carcinoma in situ of skin of other parts of face: Secondary | ICD-10-CM | POA: Diagnosis not present

## 2020-09-05 DIAGNOSIS — L570 Actinic keratosis: Secondary | ICD-10-CM | POA: Diagnosis not present

## 2020-09-05 DIAGNOSIS — L821 Other seborrheic keratosis: Secondary | ICD-10-CM | POA: Diagnosis not present

## 2020-09-05 DIAGNOSIS — D485 Neoplasm of uncertain behavior of skin: Secondary | ICD-10-CM | POA: Diagnosis not present

## 2020-09-05 DIAGNOSIS — L578 Other skin changes due to chronic exposure to nonionizing radiation: Secondary | ICD-10-CM | POA: Diagnosis not present

## 2020-09-05 DIAGNOSIS — L814 Other melanin hyperpigmentation: Secondary | ICD-10-CM | POA: Diagnosis not present

## 2020-09-05 DIAGNOSIS — Z85828 Personal history of other malignant neoplasm of skin: Secondary | ICD-10-CM | POA: Diagnosis not present

## 2020-09-06 LAB — DERMATOPATH , 2 SPECIMENS

## 2020-09-06 LAB — DERMATOPATHOLOGY REPORT

## 2020-09-24 ENCOUNTER — Ambulatory Visit (INDEPENDENT_AMBULATORY_CARE_PROVIDER_SITE_OTHER): Payer: PPO

## 2020-09-24 ENCOUNTER — Ambulatory Visit: Payer: PPO | Admitting: General Practice

## 2020-09-24 ENCOUNTER — Other Ambulatory Visit: Payer: Self-pay

## 2020-09-24 VITALS — BP 118/70 | HR 64 | Temp 97.9°F | Resp 16 | Ht 70.0 in | Wt 193.0 lb

## 2020-09-24 DIAGNOSIS — Z7901 Long term (current) use of anticoagulants: Secondary | ICD-10-CM

## 2020-09-24 DIAGNOSIS — Z0000 Encounter for general adult medical examination without abnormal findings: Secondary | ICD-10-CM | POA: Diagnosis not present

## 2020-09-24 DIAGNOSIS — I4891 Unspecified atrial fibrillation: Secondary | ICD-10-CM

## 2020-09-24 LAB — POCT INR: INR: 3 (ref 2.0–3.0)

## 2020-09-24 NOTE — Patient Instructions (Signed)
Albert Hawkins , Thank you for taking time to come for your Medicare Wellness Visit. I appreciate your ongoing commitment to your health goals. Please review the following plan we discussed and let me know if I can assist you in the future.   Screening recommendations/referrals: Colonoscopy: 08/18/2017; Cologuard (normal) due every 3 years Recommended yearly ophthalmology/optometry visit for glaucoma screening and checkup Recommended yearly dental visit for hygiene and checkup  Vaccinations: Influenza vaccine: 02/06/2020 Pneumococcal vaccine: 04/01/2009, 12/04/2014 Tdap vaccine: never done Shingles vaccine:Please call your insurance company to determine your out of pocket expense for the Shingrix vaccine. You may receive this vaccine at your local pharmacy. Covid-19: 05/16/2019, 06/05/2019, 05/03/2020  Advanced directives: Please bring a copy of your health care power of attorney and living will to the office at your convenience.  Conditions/risks identified: Yes; Reviewed health maintenance screenings with patient today and relevant education, vaccines, and/or referrals were provided. Please continue to do your personal lifestyle choices by: daily care of teeth and gums, regular physical activity (goal should be 5 days a week for 30 minutes), eat a healthy diet, avoid tobacco and drug use, limiting any alcohol intake, taking a low-dose aspirin (if not allergic or have been advised by your provider otherwise) and taking vitamins and minerals as recommended by your provider. Continue doing brain stimulating activities (puzzles, reading, adult coloring books, staying active) to keep memory sharp. Continue to eat heart healthy diet (full of fruits, vegetables, whole grains, lean protein, water--limit salt, fat, and sugar intake) and increase physical activity as tolerated.  Next appointment: Please schedule your next Medicare Wellness Visit with your Nurse Health Advisor in 1 year by calling  [phone].  Preventive Care 56 Years and Older, Male Preventive care refers to lifestyle choices and visits with your health care provider that can promote health and wellness. What does preventive care include?  A yearly physical exam. This is also called an annual well check.  Dental exams once or twice a year.  Routine eye exams. Ask your health care provider how often you should have your eyes checked.  Personal lifestyle choices, including:  Daily care of your teeth and gums.  Regular physical activity.  Eating a healthy diet.  Avoiding tobacco and drug use.  Limiting alcohol use.  Practicing safe sex.  Taking low doses of aspirin every day.  Taking vitamin and mineral supplements as recommended by your health care provider. What happens during an annual well check? The services and screenings done by your health care provider during your annual well check will depend on your age, overall health, lifestyle risk factors, and family history of disease. Counseling  Your health care provider may ask you questions about your:  Alcohol use.  Tobacco use.  Drug use.  Emotional well-being.  Home and relationship well-being.  Sexual activity.  Eating habits.  History of falls.  Memory and ability to understand (cognition).  Work and work Statistician. Screening  You may have the following tests or measurements:  Height, weight, and BMI.  Blood pressure.  Lipid and cholesterol levels. These may be checked every 5 years, or more frequently if you are over 20 years old.  Skin check.  Lung cancer screening. You may have this screening every year starting at age 48 if you have a 30-pack-year history of smoking and currently smoke or have quit within the past 15 years.  Fecal occult blood test (FOBT) of the stool. You may have this test every year starting at age 33.  Flexible sigmoidoscopy or colonoscopy. You may have a sigmoidoscopy every 5 years or a  colonoscopy every 10 years starting at age 62.  Prostate cancer screening. Recommendations will vary depending on your family history and other risks.  Hepatitis C blood test.  Hepatitis B blood test.  Sexually transmitted disease (STD) testing.  Diabetes screening. This is done by checking your blood sugar (glucose) after you have not eaten for a while (fasting). You may have this done every 1-3 years.  Abdominal aortic aneurysm (AAA) screening. You may need this if you are a current or former smoker.  Osteoporosis. You may be screened starting at age 22 if you are at high risk. Talk with your health care provider about your test results, treatment options, and if necessary, the need for more tests. Vaccines  Your health care provider may recommend certain vaccines, such as:  Influenza vaccine. This is recommended every year.  Tetanus, diphtheria, and acellular pertussis (Tdap, Td) vaccine. You may need a Td booster every 10 years.  Zoster vaccine. You may need this after age 62.  Pneumococcal 13-valent conjugate (PCV13) vaccine. One dose is recommended after age 45.  Pneumococcal polysaccharide (PPSV23) vaccine. One dose is recommended after age 55. Talk to your health care provider about which screenings and vaccines you need and how often you need them. This information is not intended to replace advice given to you by your health care provider. Make sure you discuss any questions you have with your health care provider. Document Released: 05/10/2015 Document Revised: 01/01/2016 Document Reviewed: 02/12/2015 Elsevier Interactive Patient Education  2017 [place] Prevention in the Home Falls can cause injuries. They can happen to people of all ages. There are many things you can do to make your home safe and to help prevent falls. What can I do on the outside of my home?  Regularly fix the edges of walkways and driveways and fix any cracks.  Remove anything that  might make you trip as you walk through a door, such as a raised step or threshold.  Trim any bushes or trees on the path to your home.  Use bright outdoor lighting.  Clear any walking paths of anything that might make someone trip, such as rocks or tools.  Regularly check to see if handrails are loose or broken. Make sure that both sides of any steps have handrails.  Any raised decks and porches should have guardrails on the edges.  Have any leaves, snow, or ice cleared regularly.  Use sand or salt on walking paths during winter.  Clean up any spills in your garage right away. This includes oil or grease spills. What can I do in the bathroom?  Use night lights.  Install grab bars by the toilet and in the tub and shower. Do not use towel bars as grab bars.  Use non-skid mats or decals in the tub or shower.  If you need to sit down in the shower, use a plastic, non-slip stool.  Keep the floor dry. Clean up any water that spills on the floor as soon as it happens.  Remove soap buildup in the tub or shower regularly.  Attach bath mats securely with double-sided non-slip rug tape.  Do not have throw rugs and other things on the floor that can make you trip. What can I do in the bedroom?  Use night lights.  Make sure that you have a light by your bed that is easy to reach.  Do  not use any sheets or blankets that are too big for your bed. They should not hang down onto the floor.  Have a firm chair that has side arms. You can use this for support while you get dressed.  Do not have throw rugs and other things on the floor that can make you trip. What can I do in the kitchen?  Clean up any spills right away.  Avoid walking on wet floors.  Keep items that you use a lot in easy-to-reach places.  If you need to reach something above you, use a strong step stool that has a grab bar.  Keep electrical cords out of the way.  Do not use floor polish or wax that makes floors  slippery. If you must use wax, use non-skid floor wax.  Do not have throw rugs and other things on the floor that can make you trip. What can I do with my stairs?  Do not leave any items on the stairs.  Make sure that there are handrails on both sides of the stairs and use them. Fix handrails that are broken or loose. Make sure that handrails are as long as the stairways.  Check any carpeting to make sure that it is firmly attached to the stairs. Fix any carpet that is loose or worn.  Avoid having throw rugs at the top or bottom of the stairs. If you do have throw rugs, attach them to the floor with carpet tape.  Make sure that you have a light switch at the top of the stairs and the bottom of the stairs. If you do not have them, ask someone to add them for you. What else can I do to help prevent falls?  Wear shoes that:  Do not have high heels.  Have rubber bottoms.  Are comfortable and fit you well.  Are closed at the toe. Do not wear sandals.  If you use a stepladder:  Make sure that it is fully opened. Do not climb a closed stepladder.  Make sure that both sides of the stepladder are locked into place.  Ask someone to hold it for you, if possible.  Clearly mark and make sure that you can see:  Any grab bars or handrails.  First and last steps.  Where the edge of each step is.  Use tools that help you move around (mobility aids) if they are needed. These include:  Canes.  Walkers.  Scooters.  Crutches.  Turn on the lights when you go into a dark area. Replace any light bulbs as soon as they burn out.  Set up your furniture so you have a clear path. Avoid moving your furniture around.  If any of your floors are uneven, fix them.  If there are any pets around you, be aware of where they are.  Review your medicines with your doctor. Some medicines can make you feel dizzy. This can increase your chance of falling. Ask your doctor what other things that you  can do to help prevent falls. This information is not intended to replace advice given to you by your health care provider. Make sure you discuss any questions you have with your health care provider. Document Released: 02/07/2009 Document Revised: 09/19/2015 Document Reviewed: 05/18/2014 Elsevier Interactive Patient Education  2017 [employer].

## 2020-09-24 NOTE — Patient Instructions (Signed)
Pre visit review using our clinic review tool, if applicable. No additional management support is needed unless otherwise documented below in the visit note.  Continue to take 1/2 tablet daily except 1 tablet on Mon and Fridays.  Re-check in 6 weeks.

## 2020-09-24 NOTE — Progress Notes (Signed)
Subjective:   Albert Hawkins is a 80 y.o. male who presents for Medicare Annual/Subsequent preventive examination.  Review of Systems    No ROS. Medicare Wellness Visit. Additional risk factors are reflected in social history. Cardiac Risk Factors include: advanced age (>61men, >80 women);diabetes mellitus;dyslipidemia;hypertension;family history of premature cardiovascular disease;male gender     Objective:    Today's Vitals   09/24/20 0942 09/24/20 0943  BP: 118/70   Pulse: 64   Resp: 16   Temp: 97.9 F (36.6 C)   SpO2: 97%   Weight: 193 lb (87.5 kg)   Height: 5\' 10"  (1.778 m)   PainSc: 0-No pain 0-No pain   Body mass index is 27.69 kg/m.  Advanced Directives 09/24/2020 02/08/2020 08/24/2019  Does Patient Have a Medical Advance Directive? Yes No Yes  Type of Advance Directive Living will;Healthcare Power of Attorney - Living will;Healthcare Power of Attorney  Does patient want to make changes to medical advance directive? No - Patient declined - No - Patient declined  Copy of [place] in Chart? No - copy requested - No - copy requested  Would patient like information on creating a medical advance directive? - No - Patient declined -    Current Medications (verified) Outpatient Encounter Medications as of 09/24/2020  Medication Sig  . acetaminophen (TYLENOL) 650 MG CR tablet Take 650 mg by mouth at bedtime. arthritis  . Ascorbic Acid (VITAMIN C PO) Take 1 tablet by mouth daily.  . cetirizine (ZYRTEC) 10 MG tablet Take 10 mg by mouth at bedtime.  . Cholecalciferol (VITAMIN D) 50 MCG (2000 UT) tablet Take 4,000 Units by mouth daily.  Marland Kitchen FLUoxetine (PROZAC) 10 MG capsule TAKE ONE CAPSULE BY MOUTH DAILY  . glimepiride (AMARYL) 4 MG tablet TAKE 1 TABLET EVERY DAY WITH BREAKFAST.  . hydrochlorothiazide (MICROZIDE) 12.5 MG capsule TAKE ONE CAPSULE BY MOUTH DAILY  . losartan (COZAAR) 100 MG tablet TAKE ONE TABLET BY MOUTH DAILY  . Melatonin 5 MG CAPS Take 5  mg by mouth at bedtime as needed (sleep).  . metFORMIN (GLUCOPHAGE) 500 MG tablet TAKE TWO TABLETS BY MOUTH TWICE DAILY AFTER MEALS  . Multiple Vitamin (MULTIVITAMIN) tablet Take 1 tablet by mouth daily.  . Naphazoline-Pheniramine (OPCON-A OP) Place 1 drop into both eyes daily as needed (irritation/redness).  . naproxen sodium (ALEVE) 220 MG tablet Take 220 mg by mouth daily as needed (shoulder pain).  Glory Rosebush ULTRA test strip CHECK BLOOD SUGAR TWICE DAILY.  . pravastatin (PRAVACHOL) 20 MG tablet TAKE ONE TABLET BY MOUTH DAILY  . triamcinolone (NASACORT) 55 MCG/ACT AERO nasal inhaler Place 2 sprays into the nose at bedtime as needed (congestion).  . warfarin (COUMADIN) 2 MG tablet TAKE 1/2 TABLET DAILY EXCEPT TAKE 1 TABLET ON MONDAY OR TAKE AS DIRECTED.   No facility-administered encounter medications on file as of 09/24/2020.    Allergies (verified) Amoxicillin, Ciprofloxacin, Jardiance [empagliflozin], Ramipril, and Tramadol hcl   History: Past Medical History:  Diagnosis Date  . Atrial fibrillation (HCC)    paroxysmal, on coumadin  . Benign essential HTN   . Diabetes mellitus type II   . Hearing loss, conductive, bilateral   . Hyperlipidemia   . 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 Mobitz (type) II atrioventricular block    S/P  PPM (SJM) by Dr Rayann Heman  . Neoplasm of uncertain behavior of skin   . PVC's (premature ventricular contractions)   . 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 Sleep disorder    Past Surgical History:  Procedure Laterality Date  . 3 fatty tumors     benign  . HAND SURGERY     Right thumb amputated and reattached   . HERNIA REPAIR     Umbilical  . no colonoscopy     "did not want one" (warfarin goes against it; 11/09/13)  . PACEMAKER PLACEMENT     06/12/2008, Dr. Thompson Grayer  . PPM GENERATOR CHANGEOUT N/A 02/08/2020   Procedure: PPM GENERATOR CHANGEOUT;  Surgeon: Thompson Grayer, MD;   Location: [facility];  Service: Cardiovascular;  Laterality: N/A;  . SKIN CANCER EXCISION     above nose    Family History  Problem Relation Age of Onset  . Heart attack Paternal Grandfather 89  . Diabetes Paternal Uncle   . Diabetes Maternal Grandfather   . Skin cancer Father   . Emphysema Father   . Coronary artery disease Father        stent  . Diabetes Maternal Aunt   . Diabetes Maternal Uncle   . Stroke Neg Hx    Social History   Socioeconomic History  . Marital status: Married    Spouse name: Not on file  . Number of children: Not on file  . Years of education: Not on file  . Highest education level: Not on file  Occupational History  . Not on file  Tobacco Use  . Smoking status: Former Smoker    Packs/day: 2.00    Years: 10.00    Pack years: 20.00    Types: Cigarettes    Quit date: 04/27/1985    Years since quitting: 35.4  . Smokeless tobacco: Never Used  . Tobacco comment: smoked age [age], maximum regular consumption up to 1 ppd  Vaping Use  . Vaping Use: Never used  Substance and Sexual Activity  . Alcohol use: No  . Drug use: No  . Sexual activity: Not on file  Other Topics Concern  . Not on file  Social History Narrative  . Not on file   Social Determinants of Health   Financial Resource Strain: Low Risk   . Difficulty of Paying Living Expenses: Not hard at all  Food Insecurity: No Food Insecurity  . Worried About Charity fundraiser in the Last Year: Never true  . Ran Out of Food in the Last Year: Never true  Transportation Needs: No Transportation Needs  . Lack of Transportation (Medical): No  . Lack of Transportation (Non-Medical): No  Physical Activity: Sufficiently Active  . Days of Exercise per Week: 5 days  . Minutes of Exercise per Session: 30 min  Stress: No Stress Concern Present  . Feeling of Stress : Not at all  Social Connections: Socially Integrated  . Frequency of Communication with Friends and Family: More than three  times a week  . Frequency of Social Gatherings with Friends and Family: More than three times a week  . Attends Religious Services: More than 4 times per year  . Active Member of Clubs or Organizations: Yes  . Attends Archivist Meetings: More than 4 times per year  . Marital Status: Married    Tobacco Counseling Counseling given: Not Answered Comment: smoked age [age], maximum regular consumption up to 1 ppd   Clinical Intake:  Pre-visit preparation completed: Yes  Pain : No/denies pain Pain Score: 0-No pain     BMI -  recorded: 27.69 Nutritional Status: BMI 25 -29 Overweight Nutritional Risks: None Diabetes: Yes CBG done?: No Did pt. bring in CBG monitor from home?: No  How often do you need to have someone help you when you read instructions, pamphlets, or other written materials from your doctor or pharmacy?: 1 - Never What is the last grade level you completed in school?: Bachelor's Degree  Diabetic? yes  Interpreter Needed?: No  Information entered by :: Lisette Abu, LPN   Activities of Daily Living In your present state of health, do you have any difficulty performing the following activities: 09/24/2020  Hearing? Y  Vision? N  Difficulty concentrating or making decisions? N  Walking or climbing stairs? N  Dressing or bathing? N  Doing errands, shopping? N  Preparing Food and eating ? N  Using the Toilet? N  In the past six months, have you accidently leaked urine? N  Do you have problems with loss of bowel control? N  Managing your Medications? N  Managing your Finances? N  Housekeeping or managing your Housekeeping? N  Some recent data might be hidden    Patient Care Team: Binnie Rail, MD as PCP - General (Internal Medicine) Charlton Haws, [facility] (Pharmacist)  Indicate any recent Medical Services you may have received from other than [facility] providers in the past year (date may be approximate).     Assessment:   This is a  routine wellness examination for [place].  Hearing/Vision screen No exam data present  Dietary issues and exercise activities discussed: Current Exercise Habits: Home exercise routine, Type of exercise: walking, Time (Minutes): 30, Frequency (Times/Week): 5, Weekly Exercise (Minutes/Week): 150, Intensity: Mild, Exercise limited by: cardiac condition(s);respiratory conditions(s);psychological condition(s)  Goals Addressed              This Visit's Progress   .  Patient Stated (pt-stated)   On track     My goal is to keep my weight under 200 lbs and today I am 193 lbs.      Depression Screen PHQ 2/9 Scores 09/24/2020 11/06/2019 08/24/2019 05/08/2019 01/30/2016 10/06/2012  PHQ - 2 Score 0 0 0 0 1 0  PHQ- 9 Score - 1 - 2 - -    Fall Risk Fall Risk  09/24/2020 08/24/2019 05/08/2019 01/30/2016 10/06/2012  Falls in the past year? 0 0 0 No No  Number falls in past yr: 0 0 0 - -  Injury with Fall? 0 0 - - -  Risk for fall due to : No Fall Risks No Fall Risks - - -  Follow up Falls evaluation completed - - - -    FALL RISK PREVENTION PERTAINING TO THE HOME:  Any stairs in or around the home? Yes  If so, are there any without handrails? No  Home free of loose throw rugs in walkways, pet beds, electrical cords, etc? Yes  Adequate lighting in your home to reduce risk of falls? Yes   ASSISTIVE DEVICES UTILIZED TO PREVENT FALLS:  Life alert? No  Use of a cane, walker or w/c? No  Grab bars in the bathroom? Yes  Shower chair or bench in shower? Yes  Elevated toilet seat or a handicapped toilet? Yes   TIMED UP AND GO:  Was the test performed? No .  Length of time to ambulate 10 feet: 0 sec.   Gait steady and fast without use of assistive device  Cognitive Function: Normal cognitive status assessed by direct observation by this Nurse Health Advisor. No  abnormalities found.          Immunizations Immunization History  Administered Date(s) Administered  . Fluad Quad(high Dose 65+)  02/17/2019, 02/06/2020  . Influenza Split 02/12/2011, 02/02/2012  . Influenza Whole 02/25/2006, 04/14/2007, 02/16/2008, 02/18/2009, 02/03/2010  . Influenza, High Dose Seasonal PF 02/02/2014, 01/22/2015, 01/24/2016, 01/15/2017, 01/25/2018  . Influenza-Unspecified 01/25/2013  . PFIZER(Purple Top)SARS-COV-2 Vaccination 05/16/2019, 06/05/2019, 05/03/2020  . Pneumococcal Conjugate-13 12/04/2014  . Pneumococcal Polysaccharide-23 04/01/2009    TDAP status: Due, Education has been provided regarding the importance of this vaccine. Advised may receive this vaccine at local pharmacy or Health Dept. Aware to provide a copy of the vaccination record if obtained from local pharmacy or Health Dept. Verbalized acceptance and understanding.  Flu Vaccine status: Up to date  Pneumococcal vaccine status: Up to date  Covid-19 vaccine status: Completed vaccines  Qualifies for Shingles Vaccine? Yes   Zostavax completed No   Shingrix Completed?: No.    Education has been provided regarding the importance of this vaccine. Patient has been advised to call insurance company to determine out of pocket expense if they have not yet received this vaccine. Advised may also receive vaccine at local pharmacy or Health Dept. Verbalized acceptance and understanding.  Screening Tests Health Maintenance  Topic Date Due  . Hepatitis C Screening  Never done  . TETANUS/TDAP  Never done  . Zoster Vaccines- Shingrix (1 of 2) Never done  . OPHTHALMOLOGY EXAM  01/25/2020  . HEMOGLOBIN A1C  05/08/2020  . FOOT EXAM  11/05/2020  . INFLUENZA VACCINE  11/25/2020  . COVID-19 Vaccine  Completed  . PNA vac Low Risk Adult  Completed  . HPV VACCINES  Aged Out    Health Maintenance  Health Maintenance Due  Topic Date Due  . Hepatitis C Screening  Never done  . TETANUS/TDAP  Never done  . Zoster Vaccines- Shingrix (1 of 2) Never done  . OPHTHALMOLOGY EXAM  01/25/2020  . HEMOGLOBIN A1C  05/08/2020    Colorectal cancer  screening: Type of screening: Cologuard. Completed 08/18/2017. Repeat every 3 years  Lung Cancer Screening: (Low Dose CT Chest recommended if Age 68-80 years, 30 pack-year currently smoking OR have quit w/in 15years.) does not qualify.   Lung Cancer Screening Referral: no  Additional Screening:  Hepatitis C Screening: does qualify; Completed no  Vision Screening: Recommended annual ophthalmology exams for early detection of glaucoma and other disorders of the eye. Is the patient up to date with their annual eye exam?  Yes  Who is the provider or what is the name of the office in which the patient attends annual eye exams? Alois Cliche, OD. If pt is not established with a provider, would they like to be referred to a provider to establish care? No .   Dental Screening: Recommended annual dental exams for proper oral hygiene  Community Resource Referral / Chronic Care Management: CRR required this visit?  No   CCM required this visit?  No      Plan:     I have personally reviewed and noted the following in the patient's chart:   . Medical and social history . Use of alcohol, tobacco or illicit drugs  . Current medications and supplements including opioid prescriptions. Patient is not currently taking opioid prescriptions. . Functional ability and status . Nutritional status . Physical activity . Advanced directives . List of other physicians . Hospitalizations, surgeries, and ER visits in previous 12 months . Vitals . Screenings to include cognitive, depression, and  falls . Referrals and appointments  In addition, I have reviewed and discussed with patient certain preventive protocols, quality metrics, and best practice recommendations. A written personalized care plan for preventive services as well as general preventive health recommendations were provided to patient.     Sheral Flow, LPN   7/98/1025   Nurse Notes:  Order placed for cologuard; last done  08/18/2017.

## 2020-11-05 ENCOUNTER — Ambulatory Visit (INDEPENDENT_AMBULATORY_CARE_PROVIDER_SITE_OTHER): Payer: PPO | Admitting: General Practice

## 2020-11-05 ENCOUNTER — Other Ambulatory Visit: Payer: Self-pay

## 2020-11-05 DIAGNOSIS — Z7901 Long term (current) use of anticoagulants: Secondary | ICD-10-CM

## 2020-11-05 DIAGNOSIS — I4891 Unspecified atrial fibrillation: Secondary | ICD-10-CM | POA: Diagnosis not present

## 2020-11-05 LAB — POCT INR: INR: 3.1 — AB (ref 2.0–3.0)

## 2020-11-05 NOTE — Progress Notes (Signed)
Agree with management.  Diksha Tagliaferro J Feliciana Narayan, MD  

## 2020-11-05 NOTE — Patient Instructions (Addendum)
Pre visit review using our clinic review tool, if applicable. No additional management support is needed unless otherwise documented below in the visit note.  Skip dosage today (7/12) and then continue to take 1/2 tablet daily except 1 tablet on Mon and Fridays.  Re-check in 6 weeks.

## 2020-11-18 ENCOUNTER — Ambulatory Visit (INDEPENDENT_AMBULATORY_CARE_PROVIDER_SITE_OTHER): Payer: PPO

## 2020-11-18 DIAGNOSIS — I442 Atrioventricular block, complete: Secondary | ICD-10-CM | POA: Diagnosis not present

## 2020-11-18 DIAGNOSIS — Z950 Presence of cardiac pacemaker: Secondary | ICD-10-CM | POA: Diagnosis not present

## 2020-11-19 LAB — CUP PACEART REMOTE DEVICE CHECK
Battery Remaining Longevity: 130 mo
Battery Remaining Percentage: 95.5 %
Battery Voltage: 3.02 V
Brady Statistic RV Percent Paced: 89 %
Date Time Interrogation Session: 20220725023013
Implantable Lead Implant Date: 20100216
Implantable Lead Implant Date: 20100216
Implantable Lead Location: 753859
Implantable Lead Location: 753860
Implantable Pulse Generator Implant Date: 20211014
Lead Channel Impedance Value: 480 Ohm
Lead Channel Pacing Threshold Amplitude: 1 V
Lead Channel Pacing Threshold Pulse Width: 0.5 ms
Lead Channel Sensing Intrinsic Amplitude: 6.7 mV
Lead Channel Setting Pacing Amplitude: 1.25 V
Lead Channel Setting Pacing Pulse Width: 0.5 ms
Lead Channel Setting Sensing Sensitivity: 2 mV
Pulse Gen Model: 2272
Pulse Gen Serial Number: 3873301

## 2020-11-20 ENCOUNTER — Telehealth: Payer: Self-pay | Admitting: Internal Medicine

## 2020-11-20 DIAGNOSIS — Z7901 Long term (current) use of anticoagulants: Secondary | ICD-10-CM

## 2020-11-26 ENCOUNTER — Other Ambulatory Visit: Payer: Self-pay | Admitting: Internal Medicine

## 2020-11-30 ENCOUNTER — Other Ambulatory Visit: Payer: Self-pay | Admitting: Internal Medicine

## 2020-12-02 ENCOUNTER — Telehealth: Payer: Self-pay

## 2020-12-02 DIAGNOSIS — Z7901 Long term (current) use of anticoagulants: Secondary | ICD-10-CM

## 2020-12-02 MED ORDER — PRAVASTATIN SODIUM 20 MG PO TABS: 20.0000 mg | ORAL_TABLET | Freq: Every day | ORAL | 1 refills | Status: DC

## 2020-12-02 MED ORDER — LOSARTAN POTASSIUM 100 MG PO TABS: 100.0000 mg | ORAL_TABLET | Freq: Every day | ORAL | 0 refills | Status: DC

## 2020-12-02 MED ORDER — METFORMIN HCL 500 MG PO TABS: ORAL_TABLET | ORAL | 1 refills | Status: DC

## 2020-12-02 MED ORDER — FLUOXETINE HCL 10 MG PO CAPS: 10.0000 mg | ORAL_CAPSULE | Freq: Every day | ORAL | 0 refills | Status: DC

## 2020-12-02 MED ORDER — GLIMEPIRIDE 4 MG PO TABS: ORAL_TABLET | ORAL | 0 refills | Status: DC

## 2020-12-02 MED ORDER — HYDROCHLOROTHIAZIDE 12.5 MG PO CAPS: 12.5000 mg | ORAL_CAPSULE | Freq: Every day | ORAL | 1 refills | Status: DC

## 2020-12-02 MED ORDER — WARFARIN SODIUM 2 MG PO TABS: ORAL_TABLET | ORAL | 0 refills | Status: DC

## 2020-12-02 NOTE — Telephone Encounter (Signed)
Patient is wanting to know why his scripts were only filled for 30 days, instead of 90 days  Patient wants to know if he needs an appointment?  Please contact the patient [phone] home phone is best number to call as he can leave a message

## 2020-12-02 NOTE — Telephone Encounter (Signed)
Spoke with patient today. 

## 2020-12-03 NOTE — Telephone Encounter (Signed)
Scripts faxed in yesterday afternoon after speaking with patient. Faxed conformation received.

## 2020-12-04 ENCOUNTER — Other Ambulatory Visit: Payer: Self-pay

## 2020-12-04 ENCOUNTER — Ambulatory Visit (INDEPENDENT_AMBULATORY_CARE_PROVIDER_SITE_OTHER): Payer: PPO | Admitting: Internal Medicine

## 2020-12-04 ENCOUNTER — Encounter: Payer: Self-pay | Admitting: Internal Medicine

## 2020-12-04 VITALS — BP 128/80 | HR 66 | Temp 98.3°F | Ht 70.0 in | Wt 191.0 lb

## 2020-12-04 DIAGNOSIS — I10 Essential (primary) hypertension: Secondary | ICD-10-CM | POA: Diagnosis not present

## 2020-12-04 DIAGNOSIS — I4891 Unspecified atrial fibrillation: Secondary | ICD-10-CM

## 2020-12-04 DIAGNOSIS — F3289 Other specified depressive episodes: Secondary | ICD-10-CM

## 2020-12-04 DIAGNOSIS — E1365 Other specified diabetes mellitus with hyperglycemia: Secondary | ICD-10-CM

## 2020-12-04 DIAGNOSIS — E1351 Other specified diabetes mellitus with diabetic peripheral angiopathy without gangrene: Secondary | ICD-10-CM

## 2020-12-04 DIAGNOSIS — N1831 Chronic kidney disease, stage 3a: Secondary | ICD-10-CM

## 2020-12-04 DIAGNOSIS — E7849 Other hyperlipidemia: Secondary | ICD-10-CM | POA: Diagnosis not present

## 2020-12-04 DIAGNOSIS — IMO0002 Reserved for concepts with insufficient information to code with codable children: Secondary | ICD-10-CM

## 2020-12-04 LAB — CBC WITH DIFFERENTIAL/PLATELET
Basophils Absolute: 0 10*3/uL (ref 0.0–0.1)
Basophils Relative: 0.5 % (ref 0.0–3.0)
Eosinophils Absolute: 0.2 10*3/uL (ref 0.0–0.7)
Eosinophils Relative: 2.7 % (ref 0.0–5.0)
HCT: 43.4 % (ref 39.0–52.0)
Hemoglobin: 14.4 g/dL (ref 13.0–17.0)
Lymphocytes Relative: 29.2 % (ref 12.0–46.0)
Lymphs Abs: 1.9 10*3/uL (ref 0.7–4.0)
MCHC: 33.3 g/dL (ref 30.0–36.0)
MCV: 87.6 fl (ref 78.0–100.0)
Monocytes Absolute: 0.6 10*3/uL (ref 0.1–1.0)
Monocytes Relative: 9.6 % (ref 3.0–12.0)
Neutro Abs: 3.8 10*3/uL (ref 1.4–7.7)
Neutrophils Relative %: 58 % (ref 43.0–77.0)
Platelets: 181 10*3/uL (ref 150.0–400.0)
RBC: 4.95 Mil/uL (ref 4.22–5.81)
RDW: 13.4 % (ref 11.5–15.5)
WBC: 6.5 10*3/uL (ref 4.0–10.5)

## 2020-12-04 LAB — HEMOGLOBIN A1C: Hgb A1c MFr Bld: 8 % — ABNORMAL HIGH (ref 4.6–6.5)

## 2020-12-04 NOTE — Assessment & Plan Note (Signed)
Chronic Check lipid panel, CMP Continue pravastatin 20 mg daily

## 2020-12-04 NOTE — Assessment & Plan Note (Signed)
Chronic Sugars have been controlled Check A1c Continue metformin 500 mg - 1000 mg twice daily, glimepiride 4 mg daily

## 2020-12-04 NOTE — Patient Instructions (Addendum)
    Blood work was ordered.      Medications changes include :   none     Please followup in 6 months  

## 2020-12-04 NOTE — Progress Notes (Signed)
Subjective:    Patient ID: Albert Hawkins, male    DOB: 07/03/1940, 80 y.o.   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  He has diffuse finger stiffness - no ache.  Horse lineament helps.  Take aleve and that helps.    He is not active.  He has lost weight.    Medications and allergies reviewed with patient and updated if appropriate.  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   Periodic limb movement disorder (PLMD) 12/25/2011   Depression 01/13/2010   LUMBAR RADICULOPATHY 07/20/2008   Second degree AV block, Mobitz type II 07/18/2008   Essential hypertension 01/16/2008   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 arthritis     Ascorbic Acid (VITAMIN C PO) Take 1 tablet by mouth daily.     cetirizine (ZYRTEC) 10 MG tablet Take 10 mg by mouth at bedtime.     Cholecalciferol (VITAMIN D) 50 MCG (2000 UT) tablet  Take 4,000 Units by mouth daily.     FLUoxetine (PROZAC) 10 MG capsule Take 1 capsule (10 mg total) by mouth daily. 90 capsule 0   glimepiride (AMARYL) 4 MG tablet TAKE 1 TABLET EVERY DAY WITH BREAKFAST. 90 tablet 0   hydrochlorothiazide (MICROZIDE) 12.5 MG capsule Take 1 capsule (12.5 mg total) by mouth daily. 90 capsule 1   losartan (COZAAR) 100 MG tablet Take 1 tablet (100 mg total) by mouth daily. 90 tablet 0   Melatonin 5 MG CAPS Take 5 mg by mouth at bedtime as needed (sleep).     metFORMIN (GLUCOPHAGE) 500 MG tablet TAKE TWO TABLETS BY MOUTH TWICE DAILY AFTER MEALS 180 tablet 1   Multiple Vitamin (MULTIVITAMIN) tablet Take 1 tablet by mouth daily.     Naphazoline-Pheniramine (OPCON-A OP) Place 1 drop into both eyes daily as needed (irritation/redness).     naproxen sodium (ALEVE) 220 MG tablet Take 220 mg by mouth daily as needed (shoulder pain).     ONETOUCH ULTRA test strip CHECK BLOOD SUGAR TWICE DAILY. 100 strip 0   pravastatin (PRAVACHOL) 20 MG tablet Take 1 tablet (20 mg total) by mouth daily. 90 tablet 1   triamcinolone (NASACORT) 55 MCG/ACT AERO nasal inhaler Place 2 sprays into the nose at bedtime as needed (congestion).     warfarin (COUMADIN) 2 MG tablet TAKE 1/2 TABLET DAILY EXCEPT TAKE 1 TABLET ON MONDAY OR TAKE AS DIRECTED. 75 tablet 0   No current  facility-administered medications on file prior to visit.    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 Thompson Grayer   PPM GENERATOR CHANGEOUT N/A 02/08/2020   Procedure: PPM GENERATOR CHANGEOUT;  Surgeon: Thompson Grayer, MD;  Location: [facility];  Service: Cardiovascular;  Laterality: N/A;   SKIN CANCER EXCISION     above nose     Social History   Socioeconomic History   Marital status: Married    Spouse name: Not on file   Number of children: Not on file   Years of education: Not on file   Highest education level: Not on file  Occupational History   Not on file  Tobacco Use   Smoking status: Former    Packs/day: 2.00    Years: 10.00    Pack years: 20.00    Types: Cigarettes    Quit date: 04/27/1985    Years since quitting: 35.6   Smokeless tobacco: Never   Tobacco comments:    smoked age [age], maximum regular consumption up to 1 ppd  Vaping Use   Vaping Use: Never used  Substance and Sexual Activity   Alcohol use: No   Drug use: No   Sexual activity: Not on file  Other Topics Concern   Not on file  Social History Narrative   Not on file   Social Determinants of Health   Financial Resource Strain: Low Risk    Difficulty of Paying Living Expenses: Not hard at all  Food Insecurity: No Food Insecurity   Worried About Charity fundraiser in the Last Year: Never true   [place] in the Last Year: Never true  Transportation Needs: No Transportation Needs   Lack of Transportation (Medical): No   Lack of Transportation (Non-Medical): No  Physical Activity: Sufficiently Active   Days of Exercise per Week: 5 days   Minutes of Exercise per Session: 30 min  Stress: No Stress Concern Present   Feeling of Stress : Not at all  Social Connections: Socially Integrated   Frequency of Communication with Friends and  Family: More than three times a week   Frequency of Social Gatherings with Friends and Family: More than three times a week   Attends Religious Services: More than 4 times per year   Active Member of [employer] or Organizations: Yes   Attends Music therapist: More than 4 times per year   Marital Status: Married    Family History  Problem Relation Age of  Onset   Heart attack Paternal Grandfather 53   Diabetes Paternal Uncle    Diabetes Maternal Grandfather    Skin cancer Father    Emphysema Father    Coronary artery disease Father        stent   Diabetes Maternal Aunt    Diabetes Maternal Uncle    Stroke Neg Hx     Review of Systems  Constitutional:  Negative for fever.  Respiratory:  Negative for cough, shortness of breath and wheezing.   Cardiovascular:  Negative for chest pain, palpitations and leg swelling.  Musculoskeletal:  Positive for arthralgias.  Neurological:  Negative for light-headedness and headaches.      Objective:   Vitals:   12/04/20 1453  BP: 128/80  Pulse: 66  Temp: 98.3 F (36.8 C)  SpO2: 97%   BP Readings from Last 3 Encounters:  12/04/20 128/80  09/24/20 118/70  05/27/20 126/64   Wt Readings from Last 3 Encounters:  12/04/20 191 lb (86.6 kg)  09/24/20 193 lb (87.5 kg)  05/27/20 198 lb 3.2 oz (89.9 kg)   Body mass index is 27.41 kg/m.   Physical Exam    Constitutional: Appears well-developed and well-nourished. No distress.  HENT:  Head: Normocephalic and atraumatic.  Neck: Neck supple. No tracheal deviation present. No thyromegaly present.  No cervical lymphadenopathy Cardiovascular: Normal rate, regular rhythm and normal heart sounds.   2/6 systolic murmur heard. No carotid bruit .  No edema Pulmonary/Chest: Effort normal and breath sounds normal. No respiratory distress. No has no wheezes. No rales.  Skin: Skin is warm and dry. Not diaphoretic.  Psychiatric: Normal mood and affect. Behavior is normal.      Assessment  & Plan:    See Problem List for Assessment and Plan of chronic medical problems.    This visit occurred during the SARS-CoV-2 public health emergency.  Safety protocols were in place, including screening questions prior to the visit, additional usage of staff PPE, and extensive cleaning of exam room while observing appropriate contact time as indicated for disinfecting solutions.

## 2020-12-04 NOTE — Assessment & Plan Note (Signed)
Chronic cmp 

## 2020-12-04 NOTE — Assessment & Plan Note (Signed)
Chronic Blood pressure well controlled Continue losartan 100 mg daily, hydrochlorothiazide 12.5 mg daily CMP

## 2020-12-04 NOTE — Assessment & Plan Note (Signed)
Chronic Controlled, stable Continue fluoxetine 10 mg daily

## 2020-12-04 NOTE — Assessment & Plan Note (Addendum)
Chronic Following with Dr Rayann Heman On warfarin - managed at our coumadin clinic Asymptomatic CBC, CMP, TSH

## 2020-12-05 LAB — LIPID PANEL
Cholesterol: 157 mg/dL (ref 0–200)
HDL: 47.9 mg/dL (ref >39.00)
NonHDL: 109.04
Total CHOL/HDL Ratio: 3
Triglycerides: 300 mg/dL — ABNORMAL HIGH (ref 0.0–149.0)
VLDL: 60 mg/dL — ABNORMAL HIGH (ref 0.0–40.0)

## 2020-12-05 LAB — COMPREHENSIVE METABOLIC PANEL
ALT: 22 U/L (ref 0–53)
AST: 27 U/L (ref 0–37)
Albumin: 4.3 g/dL (ref 3.5–5.2)
Alkaline Phosphatase: 42 U/L (ref 39–117)
BUN: 18 mg/dL (ref 6–23)
CO2: 22 mEq/L (ref 19–32)
Calcium: 9.7 mg/dL (ref 8.4–10.5)
Chloride: 98 mEq/L (ref 96–112)
Creatinine, Ser: 1.3 mg/dL (ref 0.40–1.50)
GFR: 52.89 mL/min — ABNORMAL LOW (ref >60.00)
Glucose, Bld: 160 mg/dL — ABNORMAL HIGH (ref 70–99)
Potassium: 4.4 mEq/L (ref 3.5–5.1)
Sodium: 140 mEq/L (ref 135–145)
Total Bilirubin: 0.9 mg/dL (ref 0.2–1.2)
Total Protein: 7 g/dL (ref 6.0–8.3)

## 2020-12-05 LAB — LDL CHOLESTEROL, DIRECT: Direct LDL: 74 mg/dL

## 2020-12-05 LAB — TSH: TSH: 1.57 u[IU]/mL (ref 0.35–5.50)

## 2020-12-10 NOTE — Progress Notes (Signed)
Remote pacemaker transmission.   

## 2020-12-19 ENCOUNTER — Ambulatory Visit (INDEPENDENT_AMBULATORY_CARE_PROVIDER_SITE_OTHER): Payer: PPO

## 2020-12-19 ENCOUNTER — Other Ambulatory Visit: Payer: Self-pay

## 2020-12-19 DIAGNOSIS — Z7901 Long term (current) use of anticoagulants: Secondary | ICD-10-CM | POA: Diagnosis not present

## 2020-12-19 DIAGNOSIS — I4891 Unspecified atrial fibrillation: Secondary | ICD-10-CM

## 2020-12-19 LAB — POCT INR: INR: 2.4 (ref 2.0–3.0)

## 2020-12-19 NOTE — Progress Notes (Signed)
Agree with management.  Evo Aderman J Huey Scalia, MD  

## 2020-12-19 NOTE — Patient Instructions (Addendum)
Pre visit review using our clinic review tool, if applicable. No additional management support is needed unless otherwise documented below in the visit note.,h  Continue to take 1/2 tablet daily except 1 tablet on Mon and Fridays.  Re-check in 6 weeks.

## 2021-01-30 ENCOUNTER — Ambulatory Visit (INDEPENDENT_AMBULATORY_CARE_PROVIDER_SITE_OTHER): Payer: PPO

## 2021-01-30 ENCOUNTER — Other Ambulatory Visit: Payer: Self-pay

## 2021-01-30 DIAGNOSIS — Z23 Encounter for immunization: Secondary | ICD-10-CM

## 2021-01-30 DIAGNOSIS — I4891 Unspecified atrial fibrillation: Secondary | ICD-10-CM

## 2021-01-30 DIAGNOSIS — Z7901 Long term (current) use of anticoagulants: Secondary | ICD-10-CM | POA: Diagnosis not present

## 2021-01-30 LAB — POCT INR: INR: 2.9 (ref 2.0–3.0)

## 2021-01-30 NOTE — Progress Notes (Signed)
Agree with management.  Cyree Chuong J Chelesea Weiand, MD  

## 2021-01-30 NOTE — Patient Instructions (Addendum)
Pre visit review using our clinic review tool, if applicable. No additional management support is needed unless otherwise documented below in the visit note.  Continue to take 1/2 tablet daily except 1 tablet on Mon and Fridays.  Re-check in 6 weeks.

## 2021-02-10 DIAGNOSIS — E119 Type 2 diabetes mellitus without complications: Secondary | ICD-10-CM | POA: Diagnosis not present

## 2021-02-10 LAB — HM DIABETES EYE EXAM

## 2021-02-13 ENCOUNTER — Encounter: Payer: Self-pay | Admitting: Internal Medicine

## 2021-02-13 NOTE — Progress Notes (Signed)
Outside notes received. Information abstracted. Notes sent to scan.  

## 2021-02-17 ENCOUNTER — Ambulatory Visit (INDEPENDENT_AMBULATORY_CARE_PROVIDER_SITE_OTHER): Payer: PPO

## 2021-02-17 DIAGNOSIS — I442 Atrioventricular block, complete: Secondary | ICD-10-CM | POA: Diagnosis not present

## 2021-02-17 LAB — CUP PACEART REMOTE DEVICE CHECK
Battery Remaining Longevity: 127 mo
Battery Remaining Percentage: 95 %
Battery Voltage: 3.01 V
Brady Statistic RV Percent Paced: 88 %
Date Time Interrogation Session: 20221024020012
Implantable Lead Implant Date: 20100216
Implantable Lead Implant Date: 20100216
Implantable Lead Location: 753859
Implantable Lead Location: 753860
Implantable Pulse Generator Implant Date: 20211014
Lead Channel Impedance Value: 430 Ohm
Lead Channel Pacing Threshold Amplitude: 0.875 V
Lead Channel Pacing Threshold Pulse Width: 0.5 ms
Lead Channel Sensing Intrinsic Amplitude: 5.2 mV
Lead Channel Setting Pacing Amplitude: 1.125
Lead Channel Setting Pacing Pulse Width: 0.5 ms
Lead Channel Setting Sensing Sensitivity: 2 mV
Pulse Gen Model: 2272
Pulse Gen Serial Number: 3873301

## 2021-02-24 NOTE — Progress Notes (Signed)
Remote pacemaker transmission.   

## 2021-03-11 ENCOUNTER — Other Ambulatory Visit: Payer: Self-pay

## 2021-03-11 ENCOUNTER — Telehealth: Payer: Self-pay | Admitting: Internal Medicine

## 2021-03-11 MED ORDER — FLUOXETINE HCL 10 MG PO CAPS: 10.0000 mg | ORAL_CAPSULE | Freq: Every day | ORAL | 1 refills | Status: DC

## 2021-03-11 MED ORDER — METFORMIN HCL 500 MG PO TABS: ORAL_TABLET | ORAL | 1 refills | Status: DC

## 2021-03-11 MED ORDER — GLIMEPIRIDE 4 MG PO TABS: ORAL_TABLET | ORAL | 1 refills | Status: DC

## 2021-03-11 MED ORDER — LOSARTAN POTASSIUM 100 MG PO TABS: 100.0000 mg | ORAL_TABLET | Freq: Every day | ORAL | 1 refills | Status: DC

## 2021-03-11 NOTE — Telephone Encounter (Signed)
Pt. Requesting to get an update on why metFORMIN (GLUCOPHAGE) 500 MG tablet losartan (COZAAR) 100 MG tablet FLUoxetine (PROZAC) 10 MG capsule glimepiride (AMARYL) 4 MG tablet was sent to pharmacy with 30 day supply, opposed to 90.    Please advise.    [place], [place] Phone:  [phone]  Fax:  [phone]

## 2021-03-11 NOTE — Telephone Encounter (Signed)
Spoke with patient and refills resent.

## 2021-03-13 ENCOUNTER — Ambulatory Visit (INDEPENDENT_AMBULATORY_CARE_PROVIDER_SITE_OTHER): Payer: PPO

## 2021-03-13 ENCOUNTER — Other Ambulatory Visit: Payer: Self-pay

## 2021-03-13 DIAGNOSIS — Z7901 Long term (current) use of anticoagulants: Secondary | ICD-10-CM | POA: Diagnosis not present

## 2021-03-13 LAB — POCT INR: INR: 3.6 — AB (ref 2.0–3.0)

## 2021-03-13 NOTE — Progress Notes (Signed)
Hold dose today and change weekly dose to take 1/2 tablet daily except 1 tablet on Fridays.  Re-check in 3 weeks.

## 2021-03-13 NOTE — Patient Instructions (Addendum)
Pre visit review using our clinic review tool, if applicable. No additional management support is needed unless otherwise documented below in the visit note.  Hold dose today and change weekly dose to take 1/2 tablet daily except 1 tablet on Fridays.  Re-check in 3 weeks.

## 2021-04-03 ENCOUNTER — Telehealth: Payer: Self-pay

## 2021-04-03 ENCOUNTER — Ambulatory Visit (INDEPENDENT_AMBULATORY_CARE_PROVIDER_SITE_OTHER): Payer: PPO

## 2021-04-03 ENCOUNTER — Other Ambulatory Visit: Payer: Self-pay

## 2021-04-03 DIAGNOSIS — Z7901 Long term (current) use of anticoagulants: Secondary | ICD-10-CM | POA: Diagnosis not present

## 2021-04-03 LAB — POCT INR: INR: 2.3 (ref 2.0–3.0)

## 2021-04-03 NOTE — Progress Notes (Signed)
Change weekly dose to take 1/2 tablet daily except 1 tablet on Mondays and Fridays.  Re-check in 6 weeks.

## 2021-04-03 NOTE — Patient Instructions (Addendum)
Pre visit review using our clinic review tool, if applicable. No additional management support is needed unless otherwise documented below in the visit note.  Change weekly dose to take 1/2 tablet daily except 1 tablet on Mondays and Fridays.  Re-check in 6 weeks.

## 2021-04-03 NOTE — Telephone Encounter (Signed)
Encounter created in error

## 2021-04-16 ENCOUNTER — Other Ambulatory Visit: Payer: Self-pay | Admitting: Internal Medicine

## 2021-05-15 ENCOUNTER — Ambulatory Visit: Payer: Self-pay

## 2021-05-15 ENCOUNTER — Other Ambulatory Visit: Payer: Self-pay

## 2021-05-15 ENCOUNTER — Ambulatory Visit (INDEPENDENT_AMBULATORY_CARE_PROVIDER_SITE_OTHER): Payer: PPO

## 2021-05-15 DIAGNOSIS — Z7901 Long term (current) use of anticoagulants: Secondary | ICD-10-CM

## 2021-05-15 LAB — POCT INR: INR: 2.6 (ref 2.0–3.0)

## 2021-05-15 NOTE — Progress Notes (Signed)
Continue 1/2 tablet daily except 1 tablet on Mondays and Fridays.  Recheck in 6 weeks. 

## 2021-05-15 NOTE — Patient Instructions (Addendum)
Pre visit review using our clinic review tool, if applicable. No additional management support is needed unless otherwise documented below in the visit note.  Continue 1/2 tablet daily except 1 tablet on Mondays and Fridays.  Recheck in 6 weeks. 

## 2021-05-19 ENCOUNTER — Ambulatory Visit (INDEPENDENT_AMBULATORY_CARE_PROVIDER_SITE_OTHER): Payer: PPO

## 2021-05-19 DIAGNOSIS — I442 Atrioventricular block, complete: Secondary | ICD-10-CM | POA: Diagnosis not present

## 2021-05-19 LAB — CUP PACEART REMOTE DEVICE CHECK
Battery Remaining Longevity: 122 mo
Battery Remaining Percentage: 93 %
Battery Voltage: 3.01 V
Brady Statistic RV Percent Paced: 88 %
Date Time Interrogation Session: 20230123020014
Implantable Lead Implant Date: 20100216
Implantable Lead Implant Date: 20100216
Implantable Lead Location: 753859
Implantable Lead Location: 753860
Implantable Pulse Generator Implant Date: 20211014
Lead Channel Impedance Value: 410 Ohm
Lead Channel Pacing Threshold Amplitude: 0.875 V
Lead Channel Pacing Threshold Pulse Width: 0.5 ms
Lead Channel Sensing Intrinsic Amplitude: 5.9 mV
Lead Channel Setting Pacing Amplitude: 1.125
Lead Channel Setting Pacing Pulse Width: 0.5 ms
Lead Channel Setting Sensing Sensitivity: 2 mV
Pulse Gen Model: 2272
Pulse Gen Serial Number: 3873301

## 2021-05-20 ENCOUNTER — Other Ambulatory Visit: Payer: Self-pay | Admitting: Internal Medicine

## 2021-05-22 ENCOUNTER — Other Ambulatory Visit: Payer: Self-pay | Admitting: Internal Medicine

## 2021-05-29 NOTE — Progress Notes (Signed)
Remote pacemaker transmission.   

## 2021-06-11 ENCOUNTER — Encounter: Payer: Self-pay | Admitting: Internal Medicine

## 2021-06-11 NOTE — Progress Notes (Signed)
Subjective:    Patient ID: Albert Hawkins, male    DOB: 01/13/1941, 81 y.o.   MRN: [id number]  This visit occurred during the SARS-CoV-2 public health emergency.  Safety protocols were in place, including screening questions prior to the visit, additional usage of staff PPE, and extensive cleaning of exam room while observing appropriate contact time as indicated for disinfecting solutions.     HPI The patient is here for follow up of their chronic medical problems, including DM, Afib, htn, hld, CKD, depression  Having cold symptoms - it started about 4 weeks ago.  10 days ago his symptoms got worse.  He has been taking otc medications.  He has had fevers, chills, nasal congestion but is significant, sinus pain and pressure, Sore throat that has pretty much resolved, cough, shortness of breath, wheezing, headaches and dizziness    Medications and allergies reviewed with patient and updated if appropriate.  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   Periodic limb movement disorder (PLMD) 12/25/2011   Depression 01/13/2010   LUMBAR RADICULOPATHY 07/20/2008   Second degree AV block, Mobitz type II 07/18/2008   Essential hypertension 01/16/2008   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 arthritis     Ascorbic Acid (VITAMIN C PO) Take 1 tablet by mouth daily.     augmented betamethasone dipropionate (DIPROLENE-AF) 0.05 % ointment 1 application to affected area on legs     cetirizine (ZYRTEC) 10 MG tablet Take 10 mg by mouth at bedtime.     Cholecalciferol (VITAMIN D) 50 MCG (2000 UT) tablet Take 4,000 Units by mouth daily.     fluorouracil (EFUDEX) 5 % cream 1 application to affected areas on face     FLUoxetine (PROZAC) 10 MG capsule Take 1 capsule (10 mg total) by mouth daily. 90 capsule 1   glimepiride (AMARYL) 4 MG tablet TAKE 1 TABLET EVERY DAY WITH BREAKFAST. 90 tablet 1   hydrochlorothiazide (MICROZIDE) 12.5 MG capsule TAKE ONE CAPSULE BY MOUTH DAILY 90 capsule 1   losartan (COZAAR) 100 MG tablet Take 1 tablet (100 mg total) by mouth daily. 90 tablet 1   Melatonin 5 MG CAPS Take 5 mg by mouth at bedtime as needed (sleep).     metFORMIN (GLUCOPHAGE) 500 MG tablet TAKE TWO TABLETS BY MOUTH TWICE DAILY AFTER MEALS 360 tablet 1   Multiple Vitamin (MULTIVITAMIN) tablet Take 1 tablet by mouth daily.     Naphazoline-Pheniramine (OPCON-A OP) Place 1 drop into both eyes daily as needed (irritation/redness).     naproxen sodium (ALEVE) 220 MG tablet Take 220 mg by mouth daily as needed (shoulder pain).     ONETOUCH ULTRA test strip CHECK BLOOD  SUGAR TWICE DAILY. 100 strip 0   pravastatin (PRAVACHOL) 20 MG tablet TAKE ONE TABLET BY MOUTH DAILY 90 tablet 1   triamcinolone (NASACORT) 55 MCG/ACT AERO nasal inhaler Place 2 sprays into the nose at bedtime as needed (congestion).     warfarin (COUMADIN) 2 MG tablet TAKE 1/2 TABLET DAILY EXCEPT TAKE 1 TABLET ON MONDAY OR TAKE AS DIRECTED. 75 tablet 0   No current facility-administered medications on file prior to visit.    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 Thompson Grayer   PPM GENERATOR CHANGEOUT N/A 02/08/2020   Procedure: PPM GENERATOR CHANGEOUT;  Surgeon: Thompson Grayer, MD;  Location: [facility];  Service: Cardiovascular;  Laterality: N/A;   SKIN CANCER EXCISION     above nose     Social History   Socioeconomic History   Marital status: Married    Spouse name: Not on file   Number of children: Not on file   Years of education: Not on file   Highest education level: Not on file  Occupational History   Not on file  Tobacco Use   Smoking status: Former    Packs/day: 2.00    Years: 10.00    Pack years: 20.00    Types: Cigarettes    Quit date: 04/27/1985    Years since quitting: 36.1   Smokeless tobacco: Never   Tobacco comments:    smoked age [age], maximum regular consumption up to 1 ppd  Vaping Use   Vaping Use: Never used  Substance and Sexual Activity   Alcohol use: No   Drug use: No   Sexual activity: Not on file  Other Topics Concern   Not on file  Social History Narrative   Not on file   Social Determinants of Health   Financial Resource Strain: Low Risk    Difficulty of Paying Living Expenses: Not hard at all  Food Insecurity: No Food Insecurity   Worried About Charity fundraiser in  the Last Year: Never true   [place] in the Last Year: Never true  Transportation Needs: No Transportation Needs   Lack of Transportation (Medical): No   Lack of Transportation (Non-Medical): No  Physical Activity: Sufficiently Active   Days of Exercise per Week: 5 days   Minutes of Exercise per Session: 30 min  Stress: No Stress Concern Present   Feeling of Stress : Not at all  Social Connections: Socially Integrated   Frequency of Communication with Friends  and Family: More than three times a week   Frequency of Social Gatherings with Friends and Family: More than three times a week   Attends Religious Services: More than 4 times per year   Active Member of [employer] or Organizations: Yes   Attends Music therapist: More than 4 times per year   Marital Status: Married    Family History  Problem Relation Age of Onset   Heart attack Paternal Grandfather 89   Diabetes Paternal Uncle    Diabetes Maternal Grandfather    Skin cancer Father    Emphysema Father    Coronary artery disease Father        stent   Diabetes Maternal Aunt    Diabetes Maternal Uncle    Stroke Neg Hx     Review of Systems  Constitutional:  Positive for chills and fever.  HENT:  Positive for congestion (lots), sinus pressure, sinus pain and sore throat (better). Negative for ear pain.   Respiratory:  Positive for cough (dry), shortness of breath (at times) and wheezing (at night sometimes).   Neurological:  Positive for dizziness and headaches.      Objective:   Vitals:   06/12/21 1044  BP: 110/70  Pulse: 85  Temp: 98.4 F (36.9 C)  SpO2: 99%   BP Readings from Last 3 Encounters:  06/12/21 110/70  12/04/20 128/80  09/24/20 118/70   Wt Readings from Last 3 Encounters:  06/12/21 191 lb (86.6 kg)  12/04/20 191 lb (86.6 kg)  09/24/20 193 lb (87.5 kg)   Body mass index is 27.41 kg/m.   Physical Exam    Constitutional: Appears well-developed and well-nourished. No distress.   HENT:  Head: Normocephalic and atraumatic.  Neck: Neck supple. No tracheal deviation present. No thyromegaly present.  No cervical lymphadenopathy Ears: Normal ear canals and tympanic membranes bilaterally Mouth: No erythema, exudate Cardiovascular: Normal rate, regular rhythm and normal heart sounds.   No murmur heard. No carotid bruit .  No edema Pulmonary/Chest: Effort normal and breath sounds normal. No respiratory distress. No has no wheezes. No rales.  Skin: Skin is warm and dry. Not diaphoretic.  Psychiatric: Normal mood and affect. Behavior is normal.    Covid test here negative.  Assessment & Plan:    See Problem List for Assessment and Plan of chronic medical problems.

## 2021-06-11 NOTE — Patient Instructions (Addendum)
° ° ° °  Blood work was ordered.     Medications changes include :  omnicef and cough syrup  for cold   Your prescription(s) have been sent to your pharmacy.     Return in about 6 months (around 12/10/2021) for follow up.

## 2021-06-12 ENCOUNTER — Ambulatory Visit (INDEPENDENT_AMBULATORY_CARE_PROVIDER_SITE_OTHER): Payer: PPO | Admitting: Internal Medicine

## 2021-06-12 ENCOUNTER — Other Ambulatory Visit: Payer: Self-pay

## 2021-06-12 VITALS — BP 110/70 | HR 85 | Temp 98.4°F | Ht 70.0 in | Wt 191.0 lb

## 2021-06-12 DIAGNOSIS — E1159 Type 2 diabetes mellitus with other circulatory complications: Secondary | ICD-10-CM

## 2021-06-12 DIAGNOSIS — J0100 Acute maxillary sinusitis, unspecified: Secondary | ICD-10-CM | POA: Diagnosis not present

## 2021-06-12 DIAGNOSIS — N1831 Chronic kidney disease, stage 3a: Secondary | ICD-10-CM | POA: Diagnosis not present

## 2021-06-12 DIAGNOSIS — I10 Essential (primary) hypertension: Secondary | ICD-10-CM

## 2021-06-12 DIAGNOSIS — E7849 Other hyperlipidemia: Secondary | ICD-10-CM

## 2021-06-12 DIAGNOSIS — I4891 Unspecified atrial fibrillation: Secondary | ICD-10-CM | POA: Diagnosis not present

## 2021-06-12 DIAGNOSIS — J019 Acute sinusitis, unspecified: Secondary | ICD-10-CM | POA: Insufficient documentation

## 2021-06-12 DIAGNOSIS — F3289 Other specified depressive episodes: Secondary | ICD-10-CM

## 2021-06-12 LAB — CBC WITH DIFFERENTIAL/PLATELET
Basophils Absolute: 0 10*3/uL (ref 0.0–0.1)
Basophils Relative: 0.4 % (ref 0.0–3.0)
Eosinophils Absolute: 0.2 10*3/uL (ref 0.0–0.7)
Eosinophils Relative: 1.8 % (ref 0.0–5.0)
HCT: 40.3 % (ref 39.0–52.0)
Hemoglobin: 13.3 g/dL (ref 13.0–17.0)
Lymphocytes Relative: 16.5 % (ref 12.0–46.0)
Lymphs Abs: 1.6 10*3/uL (ref 0.7–4.0)
MCHC: 33 g/dL (ref 30.0–36.0)
MCV: 87.3 fl (ref 78.0–100.0)
Monocytes Absolute: 0.9 10*3/uL (ref 0.1–1.0)
Monocytes Relative: 9.1 % (ref 3.0–12.0)
Neutro Abs: 6.8 10*3/uL (ref 1.4–7.7)
Neutrophils Relative %: 72.2 % (ref 43.0–77.0)
Platelets: 220 10*3/uL (ref 150.0–400.0)
RBC: 4.61 Mil/uL (ref 4.22–5.81)
RDW: 13.8 % (ref 11.5–15.5)
WBC: 9.4 10*3/uL (ref 4.0–10.5)

## 2021-06-12 LAB — COMPREHENSIVE METABOLIC PANEL
ALT: 18 U/L (ref 0–53)
AST: 23 U/L (ref 0–37)
Albumin: 4 g/dL (ref 3.5–5.2)
Alkaline Phosphatase: 56 U/L (ref 39–117)
BUN: 24 mg/dL — ABNORMAL HIGH (ref 6–23)
CO2: 31 mEq/L (ref 19–32)
Calcium: 9.4 mg/dL (ref 8.4–10.5)
Chloride: 95 mEq/L — ABNORMAL LOW (ref 96–112)
Creatinine, Ser: 1.26 mg/dL (ref 0.40–1.50)
GFR: 54.71 mL/min — ABNORMAL LOW (ref >60.00)
Glucose, Bld: 222 mg/dL — ABNORMAL HIGH (ref 70–99)
Potassium: 4.5 mEq/L (ref 3.5–5.1)
Sodium: 133 mEq/L — ABNORMAL LOW (ref 135–145)
Total Bilirubin: 1 mg/dL (ref 0.2–1.2)
Total Protein: 6.8 g/dL (ref 6.0–8.3)

## 2021-06-12 LAB — HEMOGLOBIN A1C: Hgb A1c MFr Bld: 7.7 % — ABNORMAL HIGH (ref 4.6–6.5)

## 2021-06-12 LAB — LIPID PANEL
Cholesterol: 127 mg/dL (ref 0–200)
HDL: 36.3 mg/dL — ABNORMAL LOW (ref >39.00)
NonHDL: 90.93
Total CHOL/HDL Ratio: 4
Triglycerides: 218 mg/dL — ABNORMAL HIGH (ref 0.0–149.0)
VLDL: 43.6 mg/dL — ABNORMAL HIGH (ref 0.0–40.0)

## 2021-06-12 LAB — LDL CHOLESTEROL, DIRECT: Direct LDL: 65 mg/dL

## 2021-06-12 MED ORDER — HYDROCODONE BIT-HOMATROP MBR 5-1.5 MG/5ML PO SOLN: 5.0000 mL | Freq: Three times a day (TID) | ORAL | 0 refills | Status: DC | PRN

## 2021-06-12 MED ORDER — CEFDINIR 300 MG PO CAPS: 300.0000 mg | ORAL_CAPSULE | Freq: Two times a day (BID) | ORAL | 0 refills | Status: DC

## 2021-06-12 NOTE — Assessment & Plan Note (Signed)
Chronic Controlled, Stable Continue fluoxetine 10 mg daily 

## 2021-06-12 NOTE — Assessment & Plan Note (Signed)
Acute Likely bacterial  Start Omnicef 300 mg BID x 10 day Tussionex cough syrup otc cold medications Rest, fluid Call if no improvement  

## 2021-06-12 NOTE — Assessment & Plan Note (Signed)
Chronic Regular exercise and healthy diet encouraged Check lipid panel  Continue pravastatin 20 mg daily 

## 2021-06-12 NOTE — Assessment & Plan Note (Addendum)
Chronic Lab Results  Component Value Date   HGBA1C 8.0 (H) 12/04/2020   Not ideally controlled last fall Continue metformin 1000 mg twice daily AC, glimepiride 4 mg daily Encouraged regular exercise Stressed diabetic diet-he is not compliant with a diabetic diet and stressed that if he make some changes this may help his sugars and avoid more medication

## 2021-06-12 NOTE — Assessment & Plan Note (Signed)
Chronic Has been stable CMP

## 2021-06-12 NOTE — Assessment & Plan Note (Signed)
Chronic Blood pressure well controlled CMP Continue HCTZ 12.5 mg daily, losartan 100 mg daily

## 2021-06-12 NOTE — Assessment & Plan Note (Signed)
Chronic Following with Dr. Rayann Heman On warfarin managed by our Coumadin nurse No symptoms of atrial fibrillation CBC, CMP

## 2021-06-26 ENCOUNTER — Ambulatory Visit: Payer: PPO

## 2021-06-27 ENCOUNTER — Other Ambulatory Visit: Payer: Self-pay

## 2021-06-27 ENCOUNTER — Ambulatory Visit (INDEPENDENT_AMBULATORY_CARE_PROVIDER_SITE_OTHER): Payer: PPO

## 2021-06-27 DIAGNOSIS — Z7901 Long term (current) use of anticoagulants: Secondary | ICD-10-CM

## 2021-06-27 LAB — POCT INR: INR: 3 (ref 2.0–3.0)

## 2021-06-27 NOTE — Patient Instructions (Addendum)
Pre visit review using our clinic review tool, if applicable. No additional management support is needed unless otherwise documented below in the visit note.  Continue 1/2 tablet daily except 1 tablet on Mondays and Fridays.  Recheck in 6 weeks. 

## 2021-06-27 NOTE — Progress Notes (Signed)
Continue 1/2 tablet daily except 1 tablet on Mondays and Fridays.  Recheck in 6 weeks. 

## 2021-07-28 ENCOUNTER — Other Ambulatory Visit: Payer: Self-pay | Admitting: Internal Medicine

## 2021-08-05 ENCOUNTER — Ambulatory Visit (INDEPENDENT_AMBULATORY_CARE_PROVIDER_SITE_OTHER): Payer: PPO

## 2021-08-05 DIAGNOSIS — Z7901 Long term (current) use of anticoagulants: Secondary | ICD-10-CM

## 2021-08-05 LAB — POCT INR: INR: 2.9 (ref 2.0–3.0)

## 2021-08-05 NOTE — Progress Notes (Signed)
Pt was given a medication list when checking in today. He reports his list is correct except he is no longer taking the hydrocodone cough syrup. This was a short term medication prescribed in Feb for cough.  ?This medication has been removed from his list.  ? ? ?Continue 1/2 tablet daily except 1 tablet on Mondays and Fridays.  Re-check in 6 weeks. ?

## 2021-08-05 NOTE — Patient Instructions (Addendum)
Pre visit review using our clinic review tool, if applicable. No additional management support is needed unless otherwise documented below in the visit note.  Continue 1/2 tablet daily except 1 tablet on Mondays and Fridays.  Recheck in 6 weeks. 

## 2021-08-17 ENCOUNTER — Other Ambulatory Visit: Payer: Self-pay | Admitting: Internal Medicine

## 2021-08-17 DIAGNOSIS — Z7901 Long term (current) use of anticoagulants: Secondary | ICD-10-CM

## 2021-08-18 NOTE — Telephone Encounter (Signed)
Pt is compliant with warfarin management and PCP apts. ?Sent in refill.  ?

## 2021-08-19 ENCOUNTER — Ambulatory Visit (INDEPENDENT_AMBULATORY_CARE_PROVIDER_SITE_OTHER): Payer: PPO

## 2021-08-19 DIAGNOSIS — I442 Atrioventricular block, complete: Secondary | ICD-10-CM | POA: Diagnosis not present

## 2021-08-19 LAB — CUP PACEART REMOTE DEVICE CHECK
Battery Remaining Longevity: 117 mo
Battery Remaining Percentage: 91 %
Battery Voltage: 3.01 V
Brady Statistic RV Percent Paced: 89 %
Date Time Interrogation Session: 20230424023732
Implantable Lead Implant Date: 20100216
Implantable Lead Implant Date: 20100216
Implantable Lead Location: 753859
Implantable Lead Location: 753860
Implantable Pulse Generator Implant Date: 20211014
Lead Channel Impedance Value: 450 Ohm
Lead Channel Pacing Threshold Amplitude: 0.875 V
Lead Channel Pacing Threshold Pulse Width: 0.5 ms
Lead Channel Sensing Intrinsic Amplitude: 7.4 mV
Lead Channel Setting Pacing Amplitude: 1.125
Lead Channel Setting Pacing Pulse Width: 0.5 ms
Lead Channel Setting Sensing Sensitivity: 2 mV
Pulse Gen Model: 2272
Pulse Gen Serial Number: 3873301

## 2021-09-03 NOTE — Progress Notes (Signed)
Remote pacemaker transmission.   

## 2021-09-09 ENCOUNTER — Other Ambulatory Visit: Payer: Self-pay | Admitting: Dermatology

## 2021-09-09 DIAGNOSIS — L814 Other melanin hyperpigmentation: Secondary | ICD-10-CM | POA: Diagnosis not present

## 2021-09-09 DIAGNOSIS — C44329 Squamous cell carcinoma of skin of other parts of face: Secondary | ICD-10-CM | POA: Diagnosis not present

## 2021-09-09 DIAGNOSIS — D485 Neoplasm of uncertain behavior of skin: Secondary | ICD-10-CM | POA: Diagnosis not present

## 2021-09-09 DIAGNOSIS — Z85828 Personal history of other malignant neoplasm of skin: Secondary | ICD-10-CM | POA: Diagnosis not present

## 2021-09-09 DIAGNOSIS — L570 Actinic keratosis: Secondary | ICD-10-CM | POA: Diagnosis not present

## 2021-09-09 DIAGNOSIS — L578 Other skin changes due to chronic exposure to nonionizing radiation: Secondary | ICD-10-CM | POA: Diagnosis not present

## 2021-09-09 DIAGNOSIS — L821 Other seborrheic keratosis: Secondary | ICD-10-CM | POA: Diagnosis not present

## 2021-09-09 DIAGNOSIS — D225 Melanocytic nevi of trunk: Secondary | ICD-10-CM | POA: Diagnosis not present

## 2021-09-10 LAB — DERMPATH, SPECIMEN A

## 2021-09-10 LAB — DERMATOPATHOLOGY REPORT

## 2021-09-16 ENCOUNTER — Ambulatory Visit (INDEPENDENT_AMBULATORY_CARE_PROVIDER_SITE_OTHER): Payer: PPO

## 2021-09-16 DIAGNOSIS — Z7901 Long term (current) use of anticoagulants: Secondary | ICD-10-CM

## 2021-09-16 LAB — POCT INR: INR: 2.3 (ref 2.0–3.0)

## 2021-09-16 NOTE — Progress Notes (Addendum)
Continue 1/2 tablet daily except 1 tablet on Mondays and Fridays.  Recheck in 6 weeks. 

## 2021-09-16 NOTE — Patient Instructions (Addendum)
Pre visit review using our clinic review tool, if applicable. No additional management support is needed unless otherwise documented below in the visit note.  Continue 1/2 tablet daily except 1 tablet on Mondays and Fridays.  Recheck in 6 weeks. 

## 2021-09-26 ENCOUNTER — Ambulatory Visit (INDEPENDENT_AMBULATORY_CARE_PROVIDER_SITE_OTHER): Payer: PPO

## 2021-09-26 DIAGNOSIS — Z0000 Encounter for general adult medical examination without abnormal findings: Secondary | ICD-10-CM

## 2021-09-26 NOTE — Progress Notes (Signed)
I connected with Albert Hawkins today by telephone and verified that I am speaking with the correct person using two identifiers. Location patient: home Location provider: work Persons participating in the virtual visit: patient, provider.   I discussed the limitations, risks, security and privacy concerns of performing an evaluation and management service by telephone and the availability of in person appointments. I also discussed with the patient that there may be a patient responsible charge related to this service. The patient expressed understanding and verbally consented to this telephonic visit.    Interactive audio and video telecommunications were attempted between this provider and patient, however failed, due to patient having technical difficulties OR patient did not have access to video capability.  We continued and completed visit with audio only.  Some vital signs may be absent or patient reported.   Time Spent with patient on telephone encounter: 30 minutes  Subjective:   Albert Hawkins is a 81 y.o. male who presents for Medicare Annual/Subsequent preventive examination.  Review of Systems     Cardiac Risk Factors include: advanced age (>34mn, >>5women);diabetes mellitus;dyslipidemia;family history of premature cardiovascular disease;hypertension;male gender     Objective:    There were no vitals filed for this visit. There is no height or weight on file to calculate BMI.     09/26/2021    2:37 PM 09/24/2020   11:22 AM 02/08/2020   12:06 PM 08/24/2019   11:48 AM  Advanced Directives  Does Patient Have a Medical Advance Directive? Yes Yes No Yes  Type of Advance Directive Living will Living will;Healthcare Power of Attorney  Living will;Healthcare Power of Attorney  Does patient want to make changes to medical advance directive? No - Patient declined No - Patient declined  No - Patient declined  Copy of [place] Chart?  No - copy requested  No  - copy requested  Would patient like information on creating a medical advance directive?   No - Patient declined     Current Medications (verified) Outpatient Encounter Medications as of 09/26/2021  Medication Sig   warfarin (COUMADIN) 2 MG tablet TAKE 1/2 TABLET DAILY EXCEPT TAKE 1 TABLET ON MONDAYS AND FRIDAYS OR TAKE AS DIRECTED BY ANTICOAGULATION CLINIC   acetaminophen (TYLENOL) 650 MG CR tablet Take 650 mg by mouth at bedtime. arthritis   Ascorbic Acid (VITAMIN C PO) Take 1 tablet by mouth daily.   augmented betamethasone dipropionate (DIPROLENE-AF) 0.05 % ointment 1 application to affected area on legs   cetirizine (ZYRTEC) 10 MG tablet Take 10 mg by mouth at bedtime.   Cholecalciferol (VITAMIN D) 50 MCG (2000 UT) tablet Take 4,000 Units by mouth daily.   fluorouracil (EFUDEX) 5 % cream 1 application to affected areas on face   FLUoxetine (PROZAC) 10 MG capsule Take 1 capsule (10 mg total) by mouth daily.   glimepiride (AMARYL) 4 MG tablet TAKE 1 TABLET EVERY DAY WITH BREAKFAST.   hydrochlorothiazide (MICROZIDE) 12.5 MG capsule TAKE ONE CAPSULE BY MOUTH DAILY   losartan (COZAAR) 100 MG tablet Take 1 tablet (100 mg total) by mouth daily.   Melatonin 5 MG CAPS Take 5 mg by mouth at bedtime as needed (sleep).   metFORMIN (GLUCOPHAGE) 500 MG tablet TAKE TWO TABLETS BY MOUTH TWICE DAILY AFTER MEALS   Multiple Vitamin (MULTIVITAMIN) tablet Take 1 tablet by mouth daily.   Naphazoline-Pheniramine (OPCON-A OP) Place 1 drop into both eyes daily as needed (irritation/redness).   naproxen sodium (ALEVE) 220 MG tablet Take  220 mg by mouth daily as needed (shoulder pain).   ONETOUCH ULTRA test strip CHECK BLOOD SUGAR TWICE DAILY.   pravastatin (PRAVACHOL) 20 MG tablet TAKE ONE TABLET BY MOUTH DAILY   triamcinolone (NASACORT) 55 MCG/ACT AERO nasal inhaler Place 2 sprays into the nose at bedtime as needed (congestion).   [DISCONTINUED] cefdinir (OMNICEF) 300 MG capsule Take 1 capsule (300 mg total) by  mouth 2 (two) times daily.   No facility-administered encounter medications on file as of 09/26/2021.    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 Thompson Grayer   PPM GENERATOR CHANGEOUT N/A 02/08/2020   Procedure: PPM GENERATOR CHANGEOUT;  Surgeon: Thompson Grayer, MD;  Location: [facility];  Service: Cardiovascular;  Laterality: N/A;   SKIN CANCER EXCISION     above nose    Family History  Problem Relation Age of Onset   Heart attack Paternal Grandfather 51   Diabetes Paternal Uncle    Diabetes Maternal Grandfather    Skin cancer Father    Emphysema Father    Coronary artery disease Father        stent   Diabetes Maternal Aunt    Diabetes Maternal Uncle    Stroke Neg Hx    Social History   Socioeconomic History   Marital status: Married    Spouse name: Not on file   Number of children: Not on file   Years of education: Not on file   Highest education level: Not on file  Occupational History   Not on file  Tobacco Use   Smoking status: Former    Packs/day: 2.00    Years: 10.00    Pack  years: 20.00    Types: Cigarettes    Quit date: 04/27/1985    Years since quitting: 36.4   Smokeless tobacco: Never   Tobacco comments:    smoked age [age], maximum regular consumption up to 1 ppd  Vaping Use   Vaping Use: Never used  Substance and Sexual Activity   Alcohol use: No   Drug use: No   Sexual activity: Not on file  Other Topics Concern   Not on file  Social History Narrative   Not on file   Social Determinants of Health   Financial Resource Strain: Low Risk    Difficulty of Paying Living Expenses: Not hard at all  Food Insecurity: No Food Insecurity   Worried About Charity fundraiser  in the Last Year: Never true   [place] in the Last Year: Never true  Transportation Needs: No Transportation Needs   Lack of Transportation (Medical): No   Lack of Transportation (Non-Medical): No  Physical Activity: Inactive   Days of Exercise per Week: 0 days   Minutes of Exercise per Session: 0 min  Stress: No Stress Concern Present   Feeling of Stress : Not at all  Social Connections: Socially Integrated   Frequency of Communication with Friends and Family: More than three times a week   Frequency of Social Gatherings with Friends and Family: More than three times a week   Attends Religious Services: More than 4 times per year   Active Member of [employer] or Organizations: Yes   Attends Music therapist: More than 4 times per year   Marital Status: Married    Tobacco Counseling Counseling given: Not Answered Tobacco comments: smoked age [age], maximum regular consumption up to 1 ppd   Clinical Intake:  Pre-visit preparation completed: Yes  Pain : No/denies pain     BMI - recorded: 27.41 Nutritional Risks: None Diabetes: Yes CBG done?: No Did pt. bring in CBG monitor from home?: No  How often do you need to have someone help you when you read instructions, pamphlets, or other written materials from your doctor or pharmacy?: 1 - Never What is  the last grade level you completed in school?: [employer]  Diabetic? yes  Interpreter Needed?: No  Information entered by :: Lisette Abu, LPN.   Activities of Daily Living    09/26/2021    2:39 PM 09/22/2021    9:11 AM  In your present state of health, do you have any difficulty performing the following activities:  Hearing? 0 0  Vision? 0 0  Difficulty concentrating or making decisions? 0 0  Walking or climbing stairs? 0 0  Dressing or bathing? 0 0  Doing errands, shopping? 0 0  Preparing Food and eating ? N N  Using the Toilet? N N  In the past six months, have you accidently leaked urine? N N  Do you have problems with loss of bowel control? N N  Managing your Medications? N N  Managing your Finances? N N  Housekeeping or managing your Housekeeping? N N    Patient Care Team: Binnie Rail, MD as PCP - General (Internal Medicine) Charlton Haws, [facility] (Pharmacist) Juluis Rainier as Consulting Physician (Optometry)  Indicate any recent Medical Services you may have received from other than [facility] providers in the past year (date may be approximate).     Assessment:   This is a routine wellness examination for [facility].  Hearing/Vision screen Hearing Screening - Comments:: Patient denied any hearing difficulty.   No hearing aids.  Vision Screening - Comments:: Patient does wear corrective lenses/contacts.  Eye exam done by: Dr. Alois Cliche   Dietary issues and exercise activities discussed: Current Exercise Habits: The patient does not participate in regular exercise at present, Exercise limited by: None identified   Goals Addressed   None   Depression Screen    09/26/2021    2:39 PM 09/24/2020   11:23 AM 11/06/2019   11:09 AM 08/24/2019   11:49 AM 05/08/2019    1:09 PM 01/30/2016   11:15 AM 10/06/2012   10:36 AM  PHQ 2/9 Scores  PHQ - 2 Score 0 0 0 0 0 1 0  PHQ- 9 Score   1  2  Fall Risk    09/26/2021    2:39 PM 09/22/2021    9:11 AM  09/24/2020   11:22 AM 08/24/2019   11:48 AM 05/08/2019   11:19 AM  Fall Risk   Falls in the past year? 0 0 0 0 0  Number falls in past yr: 0 0 0 0 0  Injury with Fall? 0 0 0 0   Risk for fall due to : No Fall Risks  No Fall Risks No Fall Risks   Follow up Falls evaluation completed  Falls evaluation completed      FALL RISK PREVENTION PERTAINING TO THE HOME:  Any stairs in or around the home? Yes  If so, are there any without handrails? No  Home free of loose throw rugs in walkways, pet beds, electrical cords, etc? Yes  Adequate lighting in your home to reduce risk of falls? Yes   ASSISTIVE DEVICES UTILIZED TO PREVENT FALLS:  Life alert? No  Use of a cane, walker or w/c? No  Grab bars in the bathroom? Yes  Shower chair or bench in shower? Yes  Elevated toilet seat or a handicapped toilet? Yes   TIMED UP AND GO:  Was the test performed? No .  Length of time to ambulate 10 feet: n/a sec.   Appearance of gait: Patient not evaluated for gait during this visit.  Cognitive Function:        09/26/2021    2:45 PM  6CIT Screen  What Year? 0 points  What month? 0 points  What time? 0 points  Count back from 20 0 points  Months in reverse 0 points  Repeat phrase 0 points  Total Score 0 points    Immunizations Immunization History  Administered Date(s) Administered   Fluad Quad(high Dose 65+) 02/17/2019, 02/06/2020, 01/30/2021   Influenza Split 02/12/2011, 02/02/2012   Influenza Whole 02/25/2006, 04/14/2007, 02/16/2008, 02/18/2009, 02/03/2010   Influenza, High Dose Seasonal PF 02/02/2014, 01/22/2015, 01/24/2016, 01/15/2017, 01/25/2018   Influenza-Unspecified 01/25/2013   PFIZER(Purple Top)SARS-COV-2 Vaccination 05/16/2019, 06/05/2019, 05/03/2020   Pneumococcal Conjugate-13 12/04/2014   Pneumococcal Polysaccharide-23 04/01/2009    TDAP status: Due, Education has been provided regarding the importance of this vaccine. Advised may receive this vaccine at local pharmacy or  Health Dept. Aware to provide a copy of the vaccination record if obtained from local pharmacy or Health Dept. Verbalized acceptance and understanding.  Flu Vaccine status: Up to date  Pneumococcal vaccine status: Up to date  Covid-19 vaccine status: Completed vaccines  Qualifies for Shingles Vaccine? Yes   Zostavax completed No   Shingrix Completed?: No.    Education has been provided regarding the importance of this vaccine. Patient has been advised to call insurance company to determine out of pocket expense if they have not yet received this vaccine. Advised may also receive vaccine at local pharmacy or Health Dept. Verbalized acceptance and understanding.  Screening Tests Health Maintenance  Topic Date Due   Hepatitis C Screening  Never done   TETANUS/TDAP  Never done   Zoster Vaccines- Shingrix (1 of 2) Never done   COVID-19 Vaccine (4 - Booster for Pfizer series) 06/28/2020   FOOT EXAM  11/05/2020   INFLUENZA VACCINE  11/25/2021   HEMOGLOBIN A1C  12/10/2021   OPHTHALMOLOGY EXAM  02/10/2022   Pneumonia Vaccine 66+ Years old  Completed   HPV VACCINES  Aged Out   Fecal DNA (Cologuard)  Discontinued    Health Maintenance  Health Maintenance Due  Topic Date Due  Hepatitis C Screening  Never done   TETANUS/TDAP  Never done   Zoster Vaccines- Shingrix (1 of 2) Never done   COVID-19 Vaccine (4 - Booster for Pfizer series) 06/28/2020   FOOT EXAM  11/05/2020    Colorectal cancer screening: No longer required.   Lung Cancer Screening: (Low Dose CT Chest recommended if Age 50-80 years, 30 pack-year currently smoking OR have quit w/in 15years.) does not qualify.   Lung Cancer Screening Referral: no  Additional Screening:  Hepatitis C Screening: does qualify; Completed no  Vision Screening: Recommended annual ophthalmology exams for early detection of glaucoma and other disorders of the eye. Is the patient up to date with their annual eye exam?  Yes  Who is the provider  or what is the name of the office in which the patient attends annual eye exams? Alois Cliche, OD. If pt is not established with a provider, would they like to be referred to a provider to establish care? No .   Dental Screening: Recommended annual dental exams for proper oral hygiene  Community Resource Referral / Chronic Care Management: CRR required this visit?  No   CCM required this visit?  No      Plan:     I have personally reviewed and noted the following in the patient's chart:   Medical and social history Use of alcohol, tobacco or illicit drugs  Current medications and supplements including opioid prescriptions. Patient is not currently taking opioid prescriptions. Functional ability and status Nutritional status Physical activity Advanced directives List of other physicians Hospitalizations, surgeries, and ER visits in previous 12 months Vitals Screenings to include cognitive, depression, and falls Referrals and appointments  In addition, I have reviewed and discussed with patient certain preventive protocols, quality metrics, and best practice recommendations. A written personalized care plan for preventive services as well as general preventive health recommendations were provided to patient.     Sheral Flow, LPN   12/01/5641   Nurse Notes:  There were no vitals filed for this visit. There is no height or weight on file to calculate BMI.

## 2021-09-26 NOTE — Patient Instructions (Signed)
Albert Hawkins , Thank you for taking time to come for your Medicare Wellness Visit. I appreciate your ongoing commitment to your health goals. Please review the following plan we discussed and let me know if I can assist you in the future.   Screening recommendations/referrals: Colonoscopy: Not a candidate for screening due to age Recommended yearly ophthalmology/optometry visit for glaucoma screening and checkup Recommended yearly dental visit for hygiene and checkup  Vaccinations: Influenza vaccine: 01/30/2021 Pneumococcal vaccine: 04/01/2009, 12/04/2014 Tdap vaccine: due Shingles vaccine: due   Covid-19: 05/16/2019, 06/05/2019, 05/03/2020  Advanced directives: Yes  Conditions/risks identified: Yes  Next appointment: Please schedule your next Medicare Wellness Visit with your Nurse Health Advisor in 1 year by calling [phone].  Preventive Care 26 Years and Older, Male Preventive care refers to lifestyle choices and visits with your health care provider that can promote health and wellness. What does preventive care include? A yearly physical exam. This is also called an annual well check. Dental exams once or twice a year. Routine eye exams. Ask your health care provider how often you should have your eyes checked. Personal lifestyle choices, including: Daily care of your teeth and gums. Regular physical activity. Eating a healthy diet. Avoiding tobacco and drug use. Limiting alcohol use. Practicing safe sex. Taking low doses of aspirin every day. Taking vitamin and mineral supplements as recommended by your health care provider. What happens during an annual well check? The services and screenings done by your health care provider during your annual well check will depend on your age, overall health, lifestyle risk factors, and family history of disease. Counseling  Your health care provider may ask you questions about your: Alcohol use. Tobacco use. Drug use. Emotional  well-being. Home and relationship well-being. Sexual activity. Eating habits. History of falls. Memory and ability to understand (cognition). Work and work Statistician. Screening  You may have the following tests or measurements: Height, weight, and BMI. Blood pressure. Lipid and cholesterol levels. These may be checked every 5 years, or more frequently if you are over 79 years old. Skin check. Lung cancer screening. You may have this screening every year starting at age 79 if you have a 30-pack-year history of smoking and currently smoke or have quit within the past 15 years. Fecal occult blood test (FOBT) of the stool. You may have this test every year starting at age 79. Flexible sigmoidoscopy or colonoscopy. You may have a sigmoidoscopy every 5 years or a colonoscopy every 10 years starting at age 79. Prostate cancer screening. Recommendations will vary depending on your family history and other risks. Hepatitis C blood test. Hepatitis B blood test. Sexually transmitted disease (STD) testing. Diabetes screening. This is done by checking your blood sugar (glucose) after you have not eaten for a while (fasting). You may have this done every 1-3 years. Abdominal aortic aneurysm (AAA) screening. You may need this if you are a current or former smoker. Osteoporosis. You may be screened starting at age 79 if you are at high risk. Talk with your health care provider about your test results, treatment options, and if necessary, the need for more tests. Vaccines  Your health care provider may recommend certain vaccines, such as: Influenza vaccine. This is recommended every year. Tetanus, diphtheria, and acellular pertussis (Tdap, Td) vaccine. You may need a Td booster every 10 years. Zoster vaccine. You may need this after age 79. Pneumococcal 13-valent conjugate (PCV13) vaccine. One dose is recommended after age 79. Pneumococcal polysaccharide (PPSV23) vaccine. One dose  is recommended after  age 79. Talk to your health care provider about which screenings and vaccines you need and how often you need them. This information is not intended to replace advice given to you by your health care provider. Make sure you discuss any questions you have with your health care provider. Document Released: 05/10/2015 Document Revised: 01/01/2016 Document Reviewed: 02/12/2015 Elsevier Interactive Patient Education  2017 [place] Prevention in the Home Falls can cause injuries. They can happen to people of all ages. There are many things you can do to make your home safe and to help prevent falls. What can I do on the outside of my home? Regularly fix the edges of walkways and driveways and fix any cracks. Remove anything that might make you trip as you walk through a door, such as a raised step or threshold. Trim any bushes or trees on the path to your home. Use bright outdoor lighting. Clear any walking paths of anything that might make someone trip, such as rocks or tools. Regularly check to see if handrails are loose or broken. Make sure that both sides of any steps have handrails. Any raised decks and porches should have guardrails on the edges. Have any leaves, snow, or ice cleared regularly. Use sand or salt on walking paths during winter. Clean up any spills in your garage right away. This includes oil or grease spills. What can I do in the bathroom? Use night lights. Install grab bars by the toilet and in the tub and shower. Do not use towel bars as grab bars. Use non-skid mats or decals in the tub or shower. If you need to sit down in the shower, use a plastic, non-slip stool. Keep the floor dry. Clean up any water that spills on the floor as soon as it happens. Remove soap buildup in the tub or shower regularly. Attach bath mats securely with double-sided non-slip rug tape. Do not have throw rugs and other things on the floor that can make you trip. What can I do in the  bedroom? Use night lights. Make sure that you have a light by your bed that is easy to reach. Do not use any sheets or blankets that are too big for your bed. They should not hang down onto the floor. Have a firm chair that has side arms. You can use this for support while you get dressed. Do not have throw rugs and other things on the floor that can make you trip. What can I do in the kitchen? Clean up any spills right away. Avoid walking on wet floors. Keep items that you use a lot in easy-to-reach places. If you need to reach something above you, use a strong step stool that has a grab bar. Keep electrical cords out of the way. Do not use floor polish or wax that makes floors slippery. If you must use wax, use non-skid floor wax. Do not have throw rugs and other things on the floor that can make you trip. What can I do with my stairs? Do not leave any items on the stairs. Make sure that there are handrails on both sides of the stairs and use them. Fix handrails that are broken or loose. Make sure that handrails are as long as the stairways. Check any carpeting to make sure that it is firmly attached to the stairs. Fix any carpet that is loose or worn. Avoid having throw rugs at the top or bottom of the stairs.  If you do have throw rugs, attach them to the floor with carpet tape. Make sure that you have a light switch at the top of the stairs and the bottom of the stairs. If you do not have them, ask someone to add them for you. What else can I do to help prevent falls? Wear shoes that: Do not have high heels. Have rubber bottoms. Are comfortable and fit you well. Are closed at the toe. Do not wear sandals. If you use a stepladder: Make sure that it is fully opened. Do not climb a closed stepladder. Make sure that both sides of the stepladder are locked into place. Ask someone to hold it for you, if possible. Clearly mark and make sure that you can see: Any grab bars or  handrails. First and last steps. Where the edge of each step is. Use tools that help you move around (mobility aids) if they are needed. These include: Canes. Walkers. Scooters. Crutches. Turn on the lights when you go into a dark area. Replace any light bulbs as soon as they burn out. Set up your furniture so you have a clear path. Avoid moving your furniture around. If any of your floors are uneven, fix them. If there are any pets around you, be aware of where they are. Review your medicines with your doctor. Some medicines can make you feel dizzy. This can increase your chance of falling. Ask your doctor what other things that you can do to help prevent falls. This information is not intended to replace advice given to you by your health care provider. Make sure you discuss any questions you have with your health care provider. Document Released: 02/07/2009 Document Revised: 09/19/2015 Document Reviewed: 05/18/2014 Elsevier Interactive Patient Education  2017 [employer].

## 2021-10-08 ENCOUNTER — Encounter: Payer: Self-pay | Admitting: Physician Assistant

## 2021-10-08 ENCOUNTER — Ambulatory Visit (INDEPENDENT_AMBULATORY_CARE_PROVIDER_SITE_OTHER): Payer: PPO | Admitting: Physician Assistant

## 2021-10-08 VITALS — BP 138/70 | HR 63 | Ht 70.0 in | Wt 190.8 lb

## 2021-10-08 DIAGNOSIS — I4821 Permanent atrial fibrillation: Secondary | ICD-10-CM

## 2021-10-08 DIAGNOSIS — I10 Essential (primary) hypertension: Secondary | ICD-10-CM

## 2021-10-08 DIAGNOSIS — I442 Atrioventricular block, complete: Secondary | ICD-10-CM | POA: Diagnosis not present

## 2021-10-08 DIAGNOSIS — J0100 Acute maxillary sinusitis, unspecified: Secondary | ICD-10-CM

## 2021-10-08 DIAGNOSIS — Z950 Presence of cardiac pacemaker: Secondary | ICD-10-CM

## 2021-10-08 LAB — CUP PACEART INCLINIC DEVICE CHECK
Battery Remaining Longevity: 114 mo
Battery Voltage: 3.01 V
Brady Statistic RA Percent Paced: 0 %
Brady Statistic RV Percent Paced: 89 %
Date Time Interrogation Session: 20230614093005
Implantable Lead Implant Date: 20100216
Implantable Lead Implant Date: 20100216
Implantable Lead Location: 753859
Implantable Lead Location: 753860
Implantable Pulse Generator Implant Date: 20211014
Lead Channel Impedance Value: 462.5 Ohm
Lead Channel Pacing Threshold Amplitude: 0.875 V
Lead Channel Pacing Threshold Pulse Width: 0.5 ms
Lead Channel Sensing Intrinsic Amplitude: 6 mV
Lead Channel Setting Pacing Amplitude: 1.125
Lead Channel Setting Pacing Pulse Width: 0.5 ms
Lead Channel Setting Sensing Sensitivity: 2 mV
Pulse Gen Model: 2272
Pulse Gen Serial Number: 3873301

## 2021-10-08 NOTE — Patient Instructions (Signed)
Medication Instructions:   Your physician recommends that you continue on your current medications as directed. Please refer to the Current Medication list given to you today.  *If you need a refill on your cardiac medications before your next appointment, please call your pharmacy*   Lab Work:    If you have labs (blood work) drawn today and your tests are completely normal, you will receive your results only by: [place] (if you have MyChart) OR A paper copy in the mail If you have any lab test that is abnormal or we need to change your treatment, we will call you to review the results.   Testing/Procedures: NONE ORDERED  TODAY    Follow-Up: At [facility], you and your health needs are our priority.  As part of our continuing mission to provide you with exceptional heart care, we have created designated Provider Care Teams.  These Care Teams include your primary Cardiologist (physician) and Advanced Practice Providers (APPs -  Physician Assistants and Nurse Practitioners) who all work together to provide you with the care you need, when you need it.  We recommend signing up for the patient portal called "MyChart".  Sign up information is provided on this After Visit Summary.  MyChart is used to connect with patients for Virtual Visits (Telemedicine).  Patients are able to view lab/test results, encounter notes, upcoming appointments, etc.  Non-urgent messages can be sent to your provider as well.   To learn more about what you can do with MyChart, go to [email or web address].    Your next appointment:   1 year(s)  The format for your next appointment:   In Person  Provider:   Tommye Standard, PA-C    Other Instructions:    Important Information About Sugar

## 2021-10-08 NOTE — Progress Notes (Signed)
Cardiology Office Note Date:  10/08/2021  Patient ID:  Albert Hawkins, Albert Hawkins [birth year], MRN [id number] PCP:  Binnie Rail, MD  Cardiologist:  Dr. Rayann Heman    Chief Complaint:  annual EP clinic/device visit  History of Present Illness: Albert Hawkins is a 81 y.o. male with history of Mobitz II AVBlock w/PPM, HTN, HLD, DM, Permanent AFib,    I saw him Dec 2019, he was doing well.  Had a few months in that fall with GI/UTI issues he thinks stemmed from [place].  This has resolved and "back to himself".  He reported typically being unaware of his AFib, mentioned some days he needs "a second cup of coffee" to get going thought this may be his Afib.  No CP, palpitations or cardiac awareness, no near syncope or syncope.  No bleeding with his warfarin, being managed by his PMD.   He was not doing remotes, planned for 6 mo device clinic visit  I saw him Jan 2021 He is doing well.  Unfortunately since COVID he is not exercising regularly, he and his wife used to go to [employer] and since the shut down they dont do much exercise at all.  He mentions she is having back trouble and since she can't get out and walk he doesn't either.  He denies any CP, SOB with his ADLs, and does their shopping etc without difficulty.  No DOE, no dizzy spells, near syncope or syncope. He makes mention that he usually gets a bronchitis twice in the winter and has not this year.   No bleeding, he follows his warfarin with his PMD We discussed his lipid panel, and trigs particularly, he is working on diet and weight loss.  I encouraged his to continue these efforts though also to get back to exercising, even inside his house.    Most recently saw Dr. Rayann Heman Jan 2022, continued to do quite well, rate controlled AF, pacer functioning well, not dependent.  No changes were made.  TODAY He continues to do very well. Says since 2010 when he had the pacer placed he has never had another heart symptom since. No CP,  palpitations or cardiac awareness of any kind. No SOB, DOE No near syncope or syncope. No bleeding, follows INRs with his PMD.  He does have some intermittent insomnia that he uses melatonin for, about 2 weeks ago, he took extra and woke in the night to use the bathroom, and stumbled out of bed, hit his face on the side table. He has not taken extra again.   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 Thompson Grayer   PPM GENERATOR CHANGEOUT N/A 02/08/2020   Procedure: PPM GENERATOR Tonita Cong;  Surgeon: Thompson Grayer, MD;  Location: [facility];  Service: Cardiovascular;  Laterality: N/A;   SKIN CANCER EXCISION     above nose     Current Outpatient Medications  Medication Sig Dispense Refill   acetaminophen (TYLENOL) 650 MG CR tablet Take 650 mg by mouth at bedtime. arthritis     Ascorbic Acid (VITAMIN C PO) Take 1 tablet by mouth daily.     cetirizine (ZYRTEC) 10 MG tablet Take 10 mg by mouth at bedtime.     Cholecalciferol (VITAMIN D) 50 MCG (2000 UT) tablet Take 4,000 Units by mouth daily.      fluorouracil (EFUDEX) 5 % cream 1 application to affected areas on face     FLUoxetine (PROZAC) 10 MG capsule Take 1 capsule (10 mg total) by mouth daily. 90 capsule 1   glimepiride (AMARYL) 4 MG tablet TAKE 1 TABLET EVERY DAY WITH BREAKFAST. 90 tablet 1   hydrochlorothiazide (MICROZIDE) 12.5 MG capsule TAKE ONE CAPSULE BY MOUTH DAILY 90 capsule 1   losartan (COZAAR) 100 MG tablet Take 1 tablet (100 mg total) by mouth daily. 90 tablet 1   Melatonin 5 MG CAPS Take 5 mg by mouth at bedtime as needed (sleep).     metFORMIN (GLUCOPHAGE) 500 MG tablet TAKE TWO TABLETS BY MOUTH TWICE DAILY AFTER MEALS 360 tablet 1   Multiple Vitamin (MULTIVITAMIN) tablet Take 1 tablet by mouth daily.     Naphazoline-Pheniramine (OPCON-A OP) Place 1 drop into both eyes daily as needed (irritation/redness).     naproxen sodium (ALEVE) 220 MG tablet Take 220 mg by mouth daily as needed (shoulder pain).     ONETOUCH ULTRA test strip CHECK BLOOD SUGAR TWICE DAILY. 100 strip 0   pravastatin (PRAVACHOL) 20 MG tablet TAKE ONE TABLET BY MOUTH DAILY 90 tablet 1   triamcinolone (NASACORT) 55 MCG/ACT AERO nasal inhaler Place 2 sprays into the nose at bedtime as needed (congestion).     warfarin (COUMADIN) 2 MG tablet TAKE 1/2 TABLET DAILY EXCEPT TAKE 1 TABLET ON MONDAYS AND FRIDAYS OR TAKE AS DIRECTED BY ANTICOAGULATION CLINIC 75 tablet 1   No current facility-administered medications for this visit.    Allergies:   Amoxicillin, Ciprofloxacin, Jardiance [empagliflozin], Ramipril, and Tramadol hcl   Social History:  The patient  reports that he quit smoking about 36 years ago. His smoking use included cigarettes. He has a 20.00 pack-year smoking history. He has never used smokeless tobacco. He reports that he does not drink alcohol and does not use drugs.   Family History:  The patient's family history includes Coronary artery disease in his father; Diabetes in his maternal aunt, maternal grandfather, maternal uncle, and  paternal uncle; Emphysema in his father; Heart attack (age of onset: 24) in his paternal grandfather; Skin cancer in his father.  ROS:  Please see the history of present illness.   All other systems are reviewed and otherwise negative.   PHYSICAL EXAM:  VS:  BP 138/70   Pulse 63   Ht '5\' 10"'$  (1.778 m)   Wt 190 lb 12.8 oz (86.5 kg)   SpO2 99%   BMI 27.38 kg/m  BMI: Body mass index is 27.38 kg/m. Well nourished,  well developed, in no acute distress  HEENT: normocephalic, old/healing ecchymosis R maxilla/infra oribit area Neck: no JVD, carotid bruits or masses Cardiac: RRR (paced); soft SM, no rubs, or gallops Lungs:  CTA b/l, no wheezing, rhonchi or rales  Abd: soft, nontender MS: no deformity, age appropriate atrophy Ext: no edema  Skin: warm and dry, no rash Neuro:  No gross deficits appreciated Psych: euthymic mood, full affect  PPM site is stable, no tethering or discomfort   EKG:  Done today and reviewed by myself shows  AFib, V paced 63  PPM interrogation done today and reviewed by myself:  Battery and lead measurements are good  05/12/2018: TTE - Left ventricle: The cavity size was normal. There was mild    concentric hypertrophy with moderate hypertrophy of the basal    septum. Systolic function was normal. The estimated ejection    fraction was in the range of 55% to 60%. Wall motion was normal;    there were no regional wall motion abnormalities.  - Aortic valve: Transvalvular velocity was within the normal range.    There was no stenosis. There was no regurgitation.  - Mitral valve: Transvalvular velocity was within the normal range.    There was no evidence for stenosis. There was trivial    regurgitation.  - Left atrium: The atrium was severely dilated.  - Right ventricle: The cavity size was normal. Wall thickness was    normal. Systolic function was normal.  - Atrial septum: No defect or patent foramen ovale was identified    by color flow Doppler.  -  Tricuspid valve: There was mild regurgitation.  - Pulmonary arteries: Systolic pressure was within the normal    range. PA peak pressure: 31 mm Hg (S).    04/15/16 TTE Study Conclusions - Left ventricle: The cavity size was normal. There was mild   concentric hypertrophy. Systolic function was normal. The   estimated ejection fraction was in the range of 55% to 60%.   Incoordinate (paced) distal septal motion. The study is not   technically sufficient to allow evaluation of LV diastolic   function. - Aortic valve: Trileaflet. Mildly calcified, especially at the   junction of the left and right coronary cusps. There was trivial   regurgitation. - Mitral valve: Mildly thickened leaflets with late systolic   prolapse of the AMVL . There was moderate regurgitation. - Left atrium: Moderately dilated. - Right ventricle: The cavity size was normal. Wall thickness was   normal. Pacer wire or catheter noted in right ventricle. Systolic   function was normal. - Right atrium: The atrium was mildly dilated. Pacer wire or   catheter noted in right atrium. - Tricuspid valve: There was mild regurgitation. - Pulmonary arteries: PA peak pressure: 38 mm Hg (S). - Inferior vena cava: The vessel was normal in size. The   respirophasic diameter changes were in the normal range (= 50%),   consistent with normal central venous pressure. Impressions: - LVEF 55-60%, mild LVH, incoordinate (paced) distal septal motion,   AMVL prolpase with moderate MR, moderate LAE, pacemaker wires   notede, mild RAE, mild TR, RVSP 38 mmHg, normal IVC.   03/21/14, TTE Study Conclusions - Left ventricle: The cavity size was normal. There was mild focal   basal hypertrophy of the septum. Systolic function was normal.   The estimated ejection fraction was in the range of 50% to 55%.   There is akinesis of the apical myocardium. - Left atrium: The atrium  was mildly dilated. - Right ventricle: Systolic pressure was  increased. Impressions: - When compared to prior, apical akinesis is new.  Recent Labs: 12/04/2020: TSH 1.57 06/12/2021: ALT 18; BUN 24; Creatinine, Ser 1.26; Hemoglobin 13.3; Platelets 220.0; Potassium 4.5; Sodium 133  06/12/2021: Cholesterol 127; Direct LDL 65.0; HDL 36.30; Total CHOL/HDL Ratio 4; Triglycerides 218.0; VLDL 43.6   CrCl cannot be calculated (Patient's most recent lab result is older than the maximum 21 days allowed.).   Wt Readings from Last 3 Encounters:  10/08/21 190 lb 12.8 oz (86.5 kg)  06/12/21 191 lb (86.6 kg)  12/04/20 191 lb (86.6 kg)     Other studies reviewed: Additional studies/records reviewed today include: summarized above  ASSESSMENT AND PLAN:  1. CHB w/PPM     intact function, no programming changes made      2. Permanent Afib     No symptoms     CHA2DS2Vasc is at least 3, on Warfarin  3. HTN     ooks OK, no changes today  4. HLD     Followed by his PMD     Not addressed today   Disposition: we will c/w remotes as usual and in clinic in a year, sooner if needed   Current medicines are reviewed at length with the patient today.  The patient did not have any concerns regarding medicines.  Haywood Lasso, PA-C 10/08/2021 9:22 AM     [facility] [address] [place] [address] [phone] (office)  [phone] (fax)

## 2021-10-31 ENCOUNTER — Ambulatory Visit (INDEPENDENT_AMBULATORY_CARE_PROVIDER_SITE_OTHER): Payer: PPO

## 2021-10-31 DIAGNOSIS — Z7901 Long term (current) use of anticoagulants: Secondary | ICD-10-CM | POA: Diagnosis not present

## 2021-10-31 LAB — POCT INR: INR: 2.2 (ref 2.0–3.0)

## 2021-10-31 NOTE — Patient Instructions (Addendum)
Pre visit review using our clinic review tool, if applicable. No additional management support is needed unless otherwise documented below in the visit note.  Continue 1/2 tablet daily except 1 tablet on Mondays and Fridays.  Recheck in 6 weeks. 

## 2021-10-31 NOTE — Progress Notes (Signed)
Continue 1/2 tablet daily except 1 tablet on Mondays and Fridays.  Recheck in 6 weeks. 

## 2021-11-13 DIAGNOSIS — C44329 Squamous cell carcinoma of skin of other parts of face: Secondary | ICD-10-CM | POA: Diagnosis not present

## 2021-11-15 ENCOUNTER — Other Ambulatory Visit: Payer: Self-pay | Admitting: Internal Medicine

## 2021-11-18 ENCOUNTER — Ambulatory Visit (INDEPENDENT_AMBULATORY_CARE_PROVIDER_SITE_OTHER): Payer: PPO

## 2021-11-18 DIAGNOSIS — I442 Atrioventricular block, complete: Secondary | ICD-10-CM | POA: Diagnosis not present

## 2021-11-18 LAB — CUP PACEART REMOTE DEVICE CHECK
Battery Remaining Longevity: 119 mo
Battery Remaining Percentage: 89 %
Battery Voltage: 3.01 V
Brady Statistic RV Percent Paced: 91 %
Date Time Interrogation Session: 20230724020014
Implantable Lead Implant Date: 20100216
Implantable Lead Implant Date: 20100216
Implantable Lead Location: 753859
Implantable Lead Location: 753860
Implantable Pulse Generator Implant Date: 20211014
Lead Channel Impedance Value: 450 Ohm
Lead Channel Pacing Threshold Amplitude: 0.875 V
Lead Channel Pacing Threshold Pulse Width: 0.5 ms
Lead Channel Sensing Intrinsic Amplitude: 6.7 mV
Lead Channel Setting Pacing Amplitude: 1.125
Lead Channel Setting Pacing Pulse Width: 0.5 ms
Lead Channel Setting Sensing Sensitivity: 2 mV
Pulse Gen Model: 2272
Pulse Gen Serial Number: 3873301

## 2021-12-10 ENCOUNTER — Encounter: Payer: Self-pay | Admitting: Internal Medicine

## 2021-12-10 DIAGNOSIS — D6869 Other thrombophilia: Secondary | ICD-10-CM | POA: Insufficient documentation

## 2021-12-10 NOTE — Progress Notes (Signed)
Subjective:    Patient ID: Albert Hawkins, male    DOB: 10/21/1940, 81 y.o.   MRN: [id number]     HPI Albert Hawkins is here for follow up of his chronic medical problems, including htn, CKD, hld, DM, depression, Afib - wafarin  Taking his medications daily.   He got off his cpap a little while ago when he had the bronchitis.  He has not gotten back on it.   He does not exercise at all.  He gets irritable at times.   He does check his sugars at home - on occasion.  Medications and allergies rreviewed with patient and updated if appropriate.  Current Outpatient Medications on File Prior to Visit  Medication Sig Dispense Refill   acetaminophen (TYLENOL) 650 MG CR tablet Take 650 mg by mouth at bedtime. arthritis     Ascorbic Acid (VITAMIN C PO) Take 1 tablet by mouth daily.     cetirizine (ZYRTEC) 10 MG tablet Take 10 mg by mouth at bedtime.     Cholecalciferol (VITAMIN D) 50 MCG (2000 UT) tablet Take 4,000 Units by mouth daily.     fluorouracil (EFUDEX) 5 % cream 1 application to affected areas on face     FLUoxetine (PROZAC) 10 MG capsule Take 1 capsule (10 mg total) by mouth daily. 90 capsule 1   glimepiride (AMARYL) 4 MG tablet TAKE 1 TABLET EVERY DAY WITH BREAKFAST. 90 tablet 1   hydrochlorothiazide (MICROZIDE) 12.5 MG capsule TAKE ONE CAPSULE BY MOUTH DAILY 90 capsule 1   losartan (COZAAR) 100 MG tablet Take 1 tablet (100 mg total) by mouth daily. 90 tablet 1   Melatonin 10 MG CAPS Take by mouth.     metFORMIN (GLUCOPHAGE) 500 MG tablet TAKE TWO TABLETS BY MOUTH TWICE DAILY AFTER MEALS 360 tablet 1   Multiple Vitamin (MULTIVITAMIN) tablet Take 1 tablet by mouth daily.     Naphazoline-Pheniramine (OPCON-A OP) Place 1 drop into both eyes daily as needed (irritation/redness).     naproxen sodium (ALEVE) 220 MG tablet Take 220 mg by mouth daily as needed (shoulder pain).     ONETOUCH ULTRA test strip CHECK BLOOD SUGAR TWICE DAILY. 100 strip 0   pravastatin (PRAVACHOL) 20 MG  tablet TAKE ONE TABLET BY MOUTH DAILY 90 tablet 1   triamcinolone (NASACORT) 55 MCG/ACT AERO nasal inhaler Place 2 sprays into the nose at bedtime as needed (congestion).     warfarin (COUMADIN) 2 MG tablet TAKE 1/2 TABLET DAILY EXCEPT TAKE 1 TABLET ON MONDAYS AND FRIDAYS OR TAKE AS DIRECTED BY ANTICOAGULATION CLINIC 75 tablet 1   No current facility-administered medications on file prior to visit.     Review of Systems  Constitutional:  Negative for fever.  Respiratory:  Negative for cough, shortness of breath and wheezing.   Cardiovascular:  Negative for chest pain, palpitations and leg swelling.  Musculoskeletal:  Positive for arthralgias.  Neurological:  Positive for headaches (seldom). Negative for light-headedness.       Objective:   Vitals:   12/15/21 1259  BP: 128/70  Pulse: 80  Temp: 98 F (36.7 C)  SpO2: 98%   BP Readings from Last 3 Encounters:  12/15/21 128/70  10/08/21 138/70  06/12/21 110/70   Wt Readings from Last 3 Encounters:  12/15/21 189 lb (85.7 kg)  10/08/21 190 lb 12.8 oz (86.5 kg)  06/12/21 191 lb (86.6 kg)   Body mass index is 27.12 kg/m.    Physical Exam Constitutional:  General: He is not in acute distress.    Appearance: Normal appearance. He is not ill-appearing.  HENT:     Head: Normocephalic and atraumatic.  Eyes:     Conjunctiva/sclera: Conjunctivae normal.  Cardiovascular:     Rate and Rhythm: Normal rate and regular rhythm.     Heart sounds: Normal heart sounds. No murmur heard. Pulmonary:     Effort: Pulmonary effort is normal. No respiratory distress.     Breath sounds: Normal breath sounds. No wheezing or rales.  Musculoskeletal:     Right lower leg: No edema.     Left lower leg: No edema.  Skin:    General: Skin is warm and dry.     Findings: No rash.  Neurological:     Mental Status: He is alert. Mental status is at baseline.  Psychiatric:        Mood and Affect: Mood normal.        Lab Results  Component  Value Date   WBC 9.4 06/12/2021   HGB 13.3 06/12/2021   HCT 40.3 06/12/2021   PLT 220.0 06/12/2021   GLUCOSE 222 (H) 06/12/2021   CHOL 127 06/12/2021   TRIG 218.0 (H) 06/12/2021   HDL 36.30 (L) 06/12/2021   LDLDIRECT 65.0 06/12/2021   LDLCALC 84 11/06/2019   ALT 18 06/12/2021   AST 23 06/12/2021   NA 133 (L) 06/12/2021   K 4.5 06/12/2021   CL 95 (L) 06/12/2021   CREATININE 1.26 06/12/2021   BUN 24 (H) 06/12/2021   CO2 31 06/12/2021   TSH 1.57 12/04/2020   PSA 2.36 03/17/2007   INR 2.3 12/12/2021   HGBA1C 7.7 (H) 06/12/2021   MICROALBUR 2.4 (H) 01/30/2016     Assessment & Plan:    See Problem List for Assessment and Plan of chronic medical problems.

## 2021-12-10 NOTE — Patient Instructions (Addendum)
     Blood work was ordered.      Medications changes include :   none      Return in about 6 months (around 06/17/2022) for Physical Exam.

## 2021-12-12 ENCOUNTER — Ambulatory Visit (INDEPENDENT_AMBULATORY_CARE_PROVIDER_SITE_OTHER): Payer: PPO

## 2021-12-12 DIAGNOSIS — Z7901 Long term (current) use of anticoagulants: Secondary | ICD-10-CM | POA: Diagnosis not present

## 2021-12-12 LAB — POCT INR: INR: 2.3 (ref 2.0–3.0)

## 2021-12-12 NOTE — Patient Instructions (Addendum)
Pre visit review using our clinic review tool, if applicable. No additional management support is needed unless otherwise documented below in the visit note.  Continue 1/2 tablet daily except 1 tablet on Mondays and Fridays.  Recheck in 6 weeks. 

## 2021-12-12 NOTE — Progress Notes (Signed)
Continue 1/2 tablet daily except 1 tablet on Mondays and Fridays.  Recheck in 6 weeks. 

## 2021-12-15 ENCOUNTER — Ambulatory Visit (INDEPENDENT_AMBULATORY_CARE_PROVIDER_SITE_OTHER): Payer: PPO | Admitting: Internal Medicine

## 2021-12-15 VITALS — BP 128/70 | HR 80 | Temp 98.0°F | Ht 70.0 in | Wt 189.0 lb

## 2021-12-15 DIAGNOSIS — E1159 Type 2 diabetes mellitus with other circulatory complications: Secondary | ICD-10-CM

## 2021-12-15 DIAGNOSIS — I4891 Unspecified atrial fibrillation: Secondary | ICD-10-CM | POA: Diagnosis not present

## 2021-12-15 DIAGNOSIS — F3289 Other specified depressive episodes: Secondary | ICD-10-CM

## 2021-12-15 DIAGNOSIS — N1831 Chronic kidney disease, stage 3a: Secondary | ICD-10-CM

## 2021-12-15 DIAGNOSIS — I10 Essential (primary) hypertension: Secondary | ICD-10-CM

## 2021-12-15 DIAGNOSIS — E7849 Other hyperlipidemia: Secondary | ICD-10-CM

## 2021-12-15 DIAGNOSIS — D6869 Other thrombophilia: Secondary | ICD-10-CM | POA: Diagnosis not present

## 2021-12-15 LAB — CBC WITH DIFFERENTIAL/PLATELET
Basophils Absolute: 0.1 10*3/uL (ref 0.0–0.1)
Basophils Relative: 0.6 % (ref 0.0–3.0)
Eosinophils Absolute: 0.3 10*3/uL (ref 0.0–0.7)
Eosinophils Relative: 3.5 % (ref 0.0–5.0)
HCT: 44.5 % (ref 39.0–52.0)
Hemoglobin: 14.9 g/dL (ref 13.0–17.0)
Lymphocytes Relative: 24.8 % (ref 12.0–46.0)
Lymphs Abs: 2.1 10*3/uL (ref 0.7–4.0)
MCHC: 33.4 g/dL (ref 30.0–36.0)
MCV: 88.6 fl (ref 78.0–100.0)
Monocytes Absolute: 0.8 10*3/uL (ref 0.1–1.0)
Monocytes Relative: 9.2 % (ref 3.0–12.0)
Neutro Abs: 5.3 10*3/uL (ref 1.4–7.7)
Neutrophils Relative %: 61.9 % (ref 43.0–77.0)
Platelets: 201 10*3/uL (ref 150.0–400.0)
RBC: 5.02 Mil/uL (ref 4.22–5.81)
RDW: 13.5 % (ref 11.5–15.5)
WBC: 8.6 10*3/uL (ref 4.0–10.5)

## 2021-12-15 LAB — HEMOGLOBIN A1C: Hgb A1c MFr Bld: 8.2 % — ABNORMAL HIGH (ref 4.6–6.5)

## 2021-12-15 LAB — COMPREHENSIVE METABOLIC PANEL
ALT: 28 U/L (ref 0–53)
AST: 32 U/L (ref 0–37)
Albumin: 4.5 g/dL (ref 3.5–5.2)
Alkaline Phosphatase: 57 U/L (ref 39–117)
BUN: 18 mg/dL (ref 6–23)
CO2: 29 mEq/L (ref 19–32)
Calcium: 9.7 mg/dL (ref 8.4–10.5)
Chloride: 95 mEq/L — ABNORMAL LOW (ref 96–112)
Creatinine, Ser: 1.33 mg/dL (ref 0.40–1.50)
GFR: 51.09 mL/min — ABNORMAL LOW (ref >60.00)
Glucose, Bld: 155 mg/dL — ABNORMAL HIGH (ref 70–99)
Potassium: 4.7 mEq/L (ref 3.5–5.1)
Sodium: 134 mEq/L — ABNORMAL LOW (ref 135–145)
Total Bilirubin: 1.1 mg/dL (ref 0.2–1.2)
Total Protein: 7.3 g/dL (ref 6.0–8.3)

## 2021-12-15 LAB — MICROALBUMIN / CREATININE URINE RATIO
Creatinine,U: 178.2 mg/dL
Microalb Creat Ratio: 4.5 mg/g (ref 0.0–30.0)
Microalb, Ur: 8 mg/dL — ABNORMAL HIGH (ref 0.0–1.9)

## 2021-12-15 LAB — LIPID PANEL
Cholesterol: 166 mg/dL (ref 0–200)
HDL: 46.3 mg/dL (ref >39.00)
NonHDL: 119.26
Total CHOL/HDL Ratio: 4
Triglycerides: 275 mg/dL — ABNORMAL HIGH (ref 0.0–149.0)
VLDL: 55 mg/dL — ABNORMAL HIGH (ref 0.0–40.0)

## 2021-12-15 LAB — LDL CHOLESTEROL, DIRECT: Direct LDL: 77 mg/dL

## 2021-12-15 NOTE — Assessment & Plan Note (Signed)
Chronic Blood pressure well controlled CMP Continue losartan 100 mg daily, HCTZ 12.5 mg daily 

## 2021-12-15 NOTE — Assessment & Plan Note (Signed)
Chronic Hypercoagulable secondary to atrial fibrillation on warfarin

## 2021-12-15 NOTE — Assessment & Plan Note (Signed)
Chronic Controlled, Stable Continue fluoxetine 10 mg daily 

## 2021-12-15 NOTE — Assessment & Plan Note (Signed)
Chronic Following with cardiology on warfarin which is managed by our Coumadin nurse Denies palpitations CBC, CMP

## 2021-12-15 NOTE — Assessment & Plan Note (Addendum)
Chronic Regular exercise and healthy diet encouraged Check lipid panel  Continue pravastatin 20 mg daily 

## 2021-12-15 NOTE — Progress Notes (Signed)
Remote pacemaker transmission.   

## 2021-12-15 NOTE — Assessment & Plan Note (Addendum)
Chronic Stable Advised limiting nsaids ckd, also on nsaids CMP

## 2021-12-15 NOTE — Assessment & Plan Note (Addendum)
Chronic  Lab Results  Component Value Date   HGBA1C 7.7 (H) 06/12/2021   Sugars not ideally controlled Testing sugars 1 times a day when he checks them Check A1c, urine microalbumin today Continue metformin 1000 mg twice daily, glimepiride 4 mg daily Stressed regular exercise, diabetic diet

## 2021-12-31 ENCOUNTER — Encounter: Payer: Self-pay | Admitting: Family Medicine

## 2021-12-31 ENCOUNTER — Ambulatory Visit (INDEPENDENT_AMBULATORY_CARE_PROVIDER_SITE_OTHER): Payer: PPO | Admitting: Family Medicine

## 2021-12-31 VITALS — BP 124/72 | HR 64 | Temp 97.6°F | Ht 70.0 in | Wt 186.0 lb

## 2021-12-31 DIAGNOSIS — R051 Acute cough: Secondary | ICD-10-CM | POA: Diagnosis not present

## 2021-12-31 DIAGNOSIS — R0981 Nasal congestion: Secondary | ICD-10-CM

## 2021-12-31 DIAGNOSIS — J029 Acute pharyngitis, unspecified: Secondary | ICD-10-CM | POA: Diagnosis not present

## 2021-12-31 DIAGNOSIS — J22 Unspecified acute lower respiratory infection: Secondary | ICD-10-CM

## 2021-12-31 LAB — POC COVID19 BINAXNOW: SARS Coronavirus 2 Ag: NEGATIVE

## 2021-12-31 MED ORDER — HYDROCODONE BIT-HOMATROP MBR 5-1.5 MG/5ML PO SOLN: 5.0000 mL | Freq: Three times a day (TID) | ORAL | 0 refills | Status: DC | PRN

## 2021-12-31 MED ORDER — BENZONATATE 200 MG PO CAPS: 200.0000 mg | ORAL_CAPSULE | Freq: Two times a day (BID) | ORAL | 0 refills | Status: DC | PRN

## 2021-12-31 NOTE — Patient Instructions (Signed)
I prescribed Tessalon Perles for cough and Hycodan to take at bedtime for cough as well.  This medication is sedating and should not be used in addition to NyQuil or any other sedating medications.  Make sure you are staying well-hydrated.  Follow-up if you are worsening or not feeling back to baseline in the next week or two.

## 2021-12-31 NOTE — Progress Notes (Signed)
Subjective: Chief Complaint  Patient presents with   Cough    Has been going on for about a week to 10 days.    Sore Throat   Fatigue   Nasal Congestion    Sunday was blowing nose and right nostril had blood coming from it.     Albert Hawkins is a 81 y.o. male who presents for a one week hx of nasal congestion, sore throat, fatigue and cough. States he is improving and feels better today.   Denies fever, chills, dizziness, chest pain, shortness of breath, abdominal pain, N/V/D.   Requests Hycodan and Tessalon.   Treatment to date: Nyquil, chloraseptic, Zyrtec and Nasocort.  Denies sick contacts.  No other aggravating or relieving factors.  No other c/o.  ROS as in subjective.   Objective: Vitals:   12/31/21 1412  BP: 124/72  Pulse: 64  Temp: 97.6 F (36.4 C)  SpO2: 97%    General appearance: Alert, WD/WN, no distress, well appearing                             Skin: warm, no rash                           Head: no sinus tenderness                            Eyes: conjunctiva normal, corneas clear, PERRLA                            Ears: pearly TMs, external ear canals normal                          Nose: septum midline, turbinates swollen, with erythema and clear discharge             Mouth/throat: MMM, tongue normal, mild pharyngeal erythema                           Neck: supple, no adenopathy, no thyromegaly, nontender                          Heart: RRR                         Lungs: CTA bilaterally, no wheezes, rales, or rhonchi      Assessment: Lower respiratory infection - Plan: benzonatate (TESSALON) 200 MG capsule, HYDROcodone bit-homatropine (HYCODAN) 5-1.5 MG/5ML syrup  Acute pharyngitis, unspecified etiology  Nasal congestion - Plan: POC COVID-19  Acute cough - Plan: POC COVID-19, benzonatate (TESSALON) 200 MG capsule, HYDROcodone bit-homatropine (HYCODAN) 5-1.5 MG/5ML syrup   Plan: Covid negative.  Discussed diagnosis and treatment of improving  URI.  Tessalon and Hycodan prescribed per patient request. Suggested symptomatic OTC remedies. Antibiotic therapy not prescribed since he is improving and he is in agreement.  Nasal saline spray and Nasocort for congestion.  Tylenol OTC prn. Call/return in 2-3 days if symptoms aren't resolving or if worsening. I will have a low threshold to send in antibiotics for him.

## 2022-01-20 ENCOUNTER — Ambulatory Visit (INDEPENDENT_AMBULATORY_CARE_PROVIDER_SITE_OTHER): Payer: PPO

## 2022-01-20 DIAGNOSIS — Z7901 Long term (current) use of anticoagulants: Secondary | ICD-10-CM

## 2022-01-20 LAB — POCT INR: INR: 3.3 — AB (ref 2.0–3.0)

## 2022-01-20 NOTE — Patient Instructions (Addendum)
Stop taking Aleve. When experiencing joint pain, consider taking Tylenol instead. Consider eating a serving of greens within the next 48 hours.   Continue 1/2 tablet daily except 1 tablet on Mondays and Fridays.  Re-check in 3 weeks.

## 2022-01-20 NOTE — Progress Notes (Signed)
Pt reports taking Aleve daily for the past 3-4 days for joint pain. INR 3.3 today.   Pt instructed to stop taking Aleve. When experiencing joint pain, consider taking Tylenol instead. Consider eating a serving of greens within the next 48 hours.   Continue 1/2 tablet daily except 1 tablet on Mondays and Fridays.  Re-check in 3 weeks.

## 2022-01-23 ENCOUNTER — Ambulatory Visit: Payer: PPO

## 2022-02-10 ENCOUNTER — Ambulatory Visit (INDEPENDENT_AMBULATORY_CARE_PROVIDER_SITE_OTHER): Payer: PPO

## 2022-02-10 DIAGNOSIS — Z23 Encounter for immunization: Secondary | ICD-10-CM | POA: Diagnosis not present

## 2022-02-10 DIAGNOSIS — Z7901 Long term (current) use of anticoagulants: Secondary | ICD-10-CM

## 2022-02-10 LAB — POCT INR: INR: 3 (ref 2.0–3.0)

## 2022-02-10 NOTE — Patient Instructions (Signed)
Continue 1/2 tablet daily except 1 tablet on Mondays and Fridays.  Recheck in 4 weeks.

## 2022-02-10 NOTE — Progress Notes (Signed)
Continue 1/2 tablet daily except 1 tablet on Mondays and Fridays.  Recheck in 4 weeks.

## 2022-02-13 ENCOUNTER — Other Ambulatory Visit: Payer: Self-pay | Admitting: Internal Medicine

## 2022-02-13 ENCOUNTER — Other Ambulatory Visit: Payer: Self-pay

## 2022-02-13 DIAGNOSIS — Z7901 Long term (current) use of anticoagulants: Secondary | ICD-10-CM

## 2022-02-13 MED ORDER — WARFARIN SODIUM 2 MG PO TABS: ORAL_TABLET | ORAL | 1 refills | Status: DC

## 2022-02-17 ENCOUNTER — Ambulatory Visit (INDEPENDENT_AMBULATORY_CARE_PROVIDER_SITE_OTHER): Payer: PPO

## 2022-02-17 DIAGNOSIS — I442 Atrioventricular block, complete: Secondary | ICD-10-CM | POA: Diagnosis not present

## 2022-02-17 LAB — CUP PACEART REMOTE DEVICE CHECK
Battery Remaining Longevity: 112 mo
Battery Remaining Percentage: 87 %
Battery Voltage: 3.01 V
Brady Statistic RV Percent Paced: 90 %
Date Time Interrogation Session: 20231023035903
Implantable Lead Connection Status: 753985
Implantable Lead Connection Status: 753985
Implantable Lead Implant Date: 20100216
Implantable Lead Implant Date: 20100216
Implantable Lead Location: 753859
Implantable Lead Location: 753860
Implantable Pulse Generator Implant Date: 20211014
Lead Channel Impedance Value: 440 Ohm
Lead Channel Pacing Threshold Amplitude: 1.125 V
Lead Channel Pacing Threshold Pulse Width: 0.5 ms
Lead Channel Sensing Intrinsic Amplitude: 6 mV
Lead Channel Setting Pacing Amplitude: 1.375
Lead Channel Setting Pacing Pulse Width: 0.5 ms
Lead Channel Setting Sensing Sensitivity: 2 mV
Pulse Gen Model: 2272
Pulse Gen Serial Number: 3873301

## 2022-03-03 NOTE — Progress Notes (Signed)
Remote pacemaker transmission.   

## 2022-03-10 ENCOUNTER — Ambulatory Visit (INDEPENDENT_AMBULATORY_CARE_PROVIDER_SITE_OTHER): Payer: PPO

## 2022-03-10 DIAGNOSIS — Z7901 Long term (current) use of anticoagulants: Secondary | ICD-10-CM

## 2022-03-10 LAB — POCT INR: INR: 3 (ref 2.0–3.0)

## 2022-03-10 NOTE — Patient Instructions (Signed)
Continue 1/2 tablet daily except 1 tablet on Mondays and Fridays.  Recheck on 04/28/22 at 11:30.

## 2022-03-10 NOTE — Progress Notes (Signed)
Continue 1/2 tablet daily except 1 tablet on Mondays and Fridays.  Recheck in 6 weeks.

## 2022-03-24 ENCOUNTER — Ambulatory Visit: Payer: Self-pay | Admitting: Licensed Clinical Social Worker

## 2022-03-24 NOTE — Patient Outreach (Signed)
  Care Coordination   Initial Visit Note   03/24/2022 Name: Albert Hawkins MRN: [id number] DOB: 07/08/1940  Albert Hawkins is a 81 y.o. year old male who sees Burns, Claudina Lick, MD for primary care. I spoke with  [name] by phone today.  What matters to the patients health and wellness today?    Patient reports no concerns or needs from Care Coordination team with health and wellness related to physical or mental heath. .    Goals Addressed             This Visit's Progress    COMPLETED: Care Coordination Activities No Follow up Required       Care Coordination Interventions: Reviewed Care Coordination Services:Declined Medicare Annual Wellness Visit: completed Concerns with obtaining required medications: No Assessed Social Determinants of Health no needs identified or changes from previous assessment            SDOH assessments and interventions completed:  Yes  SDOH Interventions Today    Flowsheet Row Most Recent Value  SDOH Interventions   Housing Interventions Intervention Not Indicated  Utilities Interventions Intervention Not Indicated        Care Coordination Interventions:  Yes, provided   Follow up plan: No further intervention required.   Encounter Outcome:  Pt. Visit Completed   Casimer Lanius, [place] [phone]

## 2022-03-24 NOTE — Patient Instructions (Signed)
Visit Information  Thank you for taking time to visit with me today. Please don't hesitate to contact me if I can be of assistance to you.   Following are the goals we discussed today:   Goals Addressed             This Visit's Progress    COMPLETED: Care Coordination Activities No Follow up Required       Care Coordination Interventions: Reviewed Care Coordination Services:Declined Medicare Annual Wellness Visit: completed Concerns with obtaining required medications: No Assessed Social Determinants of Health no needs identified or changes from previous assessment             Patient verbalizes understanding of instructions and care plan provided today and agrees to view in [place]. Active MyChart status and patient understanding of how to access instructions and care plan via MyChart confirmed with patient.     No further follow up required: By Care Coordination at this time  Care Coordination provides support specific to your health needs that extend beyond exceptional routine office care you already receive from your primary care doctor.    If you are eligible for standard Care Coordination, there is no cost to you.  The Care Coordination team is made up of the following team members: Registered Nurse Care Guide: disease management, health education, care coordination and complex case management Clinical Social Work: Complex Care Coordination including coordination of level of care needs, mental and behavioral health assessment and recommendations, and connection to long-term mental health support Clinical Pharmacist: medication management, assistance and disease management Community Resource Care Guides: Forensic psychologist Team: dedicated team of scheduling professionals to support patient and clinical team scheduling needs  Please call [phone] if you would like to schedule a phone appointment with one of the team members.    Casimer Lanius,  [place] [phone]

## 2022-04-08 ENCOUNTER — Other Ambulatory Visit: Payer: Self-pay | Admitting: Internal Medicine

## 2022-04-28 ENCOUNTER — Ambulatory Visit (INDEPENDENT_AMBULATORY_CARE_PROVIDER_SITE_OTHER): Payer: PPO

## 2022-04-28 DIAGNOSIS — Z7901 Long term (current) use of anticoagulants: Secondary | ICD-10-CM

## 2022-04-28 LAB — POCT INR: INR: 2 (ref 2.0–3.0)

## 2022-04-28 NOTE — Patient Instructions (Addendum)
Pre visit review using our clinic review tool, if applicable. No additional management support is needed unless otherwise documented below in the visit note.  Continue 1/2 tablet daily except 1 tablet on Mondays and Fridays.  Recheck in 6 weeks.

## 2022-04-28 NOTE — Progress Notes (Addendum)
Continue 1/2 tablet daily except 1 tablet on Mondays and Fridays.  Recheck in 6 weeks.  Pt brought in Rybelsus that he reports his wife was taking at one time. He reported she had to stop taking it due to stomach upset. Pt requested this nurse dispose of medication or make available for other pts. Can no give medication to any other pts due to dosing. Pt authorized to go into wife's chart to check on documentation for the medication. It has been documented in the past that pt was having side effects from the medication and has stopped taking it. Brought medication to management and medication has been disposed of.

## 2022-05-15 ENCOUNTER — Other Ambulatory Visit: Payer: Self-pay

## 2022-05-15 ENCOUNTER — Other Ambulatory Visit: Payer: Self-pay | Admitting: Internal Medicine

## 2022-05-18 ENCOUNTER — Other Ambulatory Visit: Payer: Self-pay

## 2022-05-19 ENCOUNTER — Ambulatory Visit: Payer: PPO | Attending: Cardiology

## 2022-05-19 DIAGNOSIS — I442 Atrioventricular block, complete: Secondary | ICD-10-CM

## 2022-05-20 LAB — CUP PACEART REMOTE DEVICE CHECK
Battery Remaining Longevity: 111 mo
Battery Remaining Percentage: 85 %
Battery Voltage: 3.01 V
Brady Statistic RV Percent Paced: 89 %
Date Time Interrogation Session: 20240122020013
Implantable Lead Connection Status: 753985
Implantable Lead Connection Status: 753985
Implantable Lead Implant Date: 20100216
Implantable Lead Implant Date: 20100216
Implantable Lead Location: 753859
Implantable Lead Location: 753860
Implantable Pulse Generator Implant Date: 20211014
Lead Channel Impedance Value: 430 Ohm
Lead Channel Pacing Threshold Amplitude: 0.875 V
Lead Channel Pacing Threshold Pulse Width: 0.5 ms
Lead Channel Sensing Intrinsic Amplitude: 6.7 mV
Lead Channel Setting Pacing Amplitude: 1.125
Lead Channel Setting Pacing Pulse Width: 0.5 ms
Lead Channel Setting Sensing Sensitivity: 2 mV
Pulse Gen Model: 2272
Pulse Gen Serial Number: 3873301

## 2022-06-05 DIAGNOSIS — E119 Type 2 diabetes mellitus without complications: Secondary | ICD-10-CM | POA: Diagnosis not present

## 2022-06-05 LAB — HM DIABETES EYE EXAM

## 2022-06-09 ENCOUNTER — Ambulatory Visit (INDEPENDENT_AMBULATORY_CARE_PROVIDER_SITE_OTHER): Payer: PPO

## 2022-06-09 DIAGNOSIS — Z7901 Long term (current) use of anticoagulants: Secondary | ICD-10-CM

## 2022-06-09 LAB — POCT INR: INR: 3.7 — AB (ref 2.0–3.0)

## 2022-06-09 NOTE — Patient Instructions (Addendum)
Pre visit review using our clinic review tool, if applicable. No additional management support is needed unless otherwise documented below in the visit note.  Hold dose today and the continue 1/2 tablet daily except 1 tablet on Mondays and Fridays.  Recheck in 3 weeks.

## 2022-06-09 NOTE — Progress Notes (Signed)
Pt taking Aleve for toe pain. But has stopped taking it now. Pt was stable on current dose for a while so no long term change will be made to weekly dosing.  Hold dose today and the continue 1/2 tablet daily except 1 tablet on Mondays and Fridays.  Recheck in 3 weeks.

## 2022-06-12 NOTE — Progress Notes (Signed)
Remote pacemaker transmission.   

## 2022-06-17 ENCOUNTER — Encounter: Payer: PPO | Admitting: Internal Medicine

## 2022-06-18 ENCOUNTER — Encounter: Payer: Self-pay | Admitting: Internal Medicine

## 2022-06-18 NOTE — Patient Instructions (Addendum)
Blood work was ordered.   The lab is on the first floor.    Medications changes include :   none     Return in about 6 months (around 12/18/2022) for follow up.    Health Maintenance, Male Adopting a healthy lifestyle and getting preventive care are important in promoting health and wellness. Ask your health care provider about: The right schedule for you to have regular tests and exams. Things you can do on your own to prevent diseases and keep yourself healthy. What should I know about diet, weight, and exercise? Eat a healthy diet  Eat a diet that includes plenty of vegetables, fruits, low-fat dairy products, and lean protein. Do not eat a lot of foods that are high in solid fats, added sugars, or sodium. Maintain a healthy weight Body mass index (BMI) is a measurement that can be used to identify possible weight problems. It estimates body fat based on height and weight. Your health care provider can help determine your BMI and help you achieve or maintain a healthy weight. Get regular exercise Get regular exercise. This is one of the most important things you can do for your health. Most adults should: Exercise for at least 150 minutes each week. The exercise should increase your heart rate and make you sweat (moderate-intensity exercise). Do strengthening exercises at least twice a week. This is in addition to the moderate-intensity exercise. Spend less time sitting. Even light physical activity can be beneficial. Watch cholesterol and blood lipids Have your blood tested for lipids and cholesterol at 80 years of age, then have this test every 5 years. You may need to have your cholesterol levels checked more often if: Your lipid or cholesterol levels are high. You are older than 80 years of age. You are at high risk for heart disease. What should I know about cancer screening? Many types of cancers can be detected early and may often be prevented. Depending on your  health history and family history, you may need to have cancer screening at various ages. This may include screening for: Colorectal cancer. Prostate cancer. Skin cancer. Lung cancer. What should I know about heart disease, diabetes, and high blood pressure? Blood pressure and heart disease High blood pressure causes heart disease and increases the risk of stroke. This is more likely to develop in people who have high blood pressure readings or are overweight. Talk with your health care provider about your target blood pressure readings. Have your blood pressure checked: Every 3-5 years if you are 32-59 years of age. Every year if you are 73 years old or older. If you are between the ages of 76 and 71 and are a current or former smoker, ask your health care provider if you should have a one-time screening for abdominal aortic aneurysm (AAA). Diabetes Have regular diabetes screenings. This checks your fasting blood sugar level. Have the screening done: Once every three years after age 62 if you are at a normal weight and have a low risk for diabetes. More often and at a younger age if you are overweight or have a high risk for diabetes. What should I know about preventing infection? Hepatitis B If you have a higher risk for hepatitis B, you should be screened for this virus. Talk with your health care provider to find out if you are at risk for hepatitis B infection. Hepatitis C Blood testing is recommended for: Everyone born from [birth year] through 1963. Anyone with known  risk factors for hepatitis C. Sexually transmitted infections (STIs) You should be screened each year for STIs, including gonorrhea and chlamydia, if: You are sexually active and are younger than 80 years of age. You are older than 80 years of age and your health care provider tells you that you are at risk for this type of infection. Your sexual activity has changed since you were last screened, and you are at increased risk  for chlamydia or gonorrhea. Ask your health care provider if you are at risk. Ask your health care provider about whether you are at high risk for HIV. Your health care provider may recommend a prescription medicine to help prevent HIV infection. If you choose to take medicine to prevent HIV, you should first get tested for HIV. You should then be tested every 3 months for as long as you are taking the medicine. Follow these instructions at home: Alcohol use Do not drink alcohol if your health care provider tells you not to drink. If you drink alcohol: Limit how much you have to 0-2 drinks a day. Know how much alcohol is in your drink. In the [place] one drink equals one 12 oz bottle of beer (355 mL), one 5 oz glass of wine (148 mL), or one 1 oz glass of hard liquor (44 mL). Lifestyle Do not use any products that contain nicotine or tobacco. These products include cigarettes, chewing tobacco, and vaping devices, such as e-cigarettes. If you need help quitting, ask your health care provider. Do not use street drugs. Do not share needles. Ask your health care provider for help if you need support or information about quitting drugs. General instructions Schedule regular health, dental, and eye exams. Stay current with your vaccines. Tell your health care provider if: You often feel depressed. You have ever been abused or do not feel safe at home. Summary Adopting a healthy lifestyle and getting preventive care are important in promoting health and wellness. Follow your health care provider's instructions about healthy diet, exercising, and getting tested or screened for diseases. Follow your health care provider's instructions on monitoring your cholesterol and blood pressure. This information is not intended to replace advice given to you by your health care provider. Make sure you discuss any questions you have with your health care provider. Document Revised: 09/02/2020 Document Reviewed:  09/02/2020 Elsevier Patient Education  [place].

## 2022-06-18 NOTE — Progress Notes (Signed)
Subjective:    Patient ID: Albert Hawkins, male    DOB: 05/15/1940, 82 y.o.   MRN: [id number]     HPI Jarmall is here for a physical exam and follow up of his chronic medical problems including htn, hld, CKD, depression, afib.   He has lost some weight.  He has lost some of his appetite.  He was doing well with compliance with a diabetic diet until there was a few family birthdays.  Recently he has been eating more cake, but knows he needs to stop that.  He takes aleve - only 1 a day.    Medications and allergies reviewed with patient and updated if appropriate.  Current Outpatient Medications on File Prior to Visit  Medication Sig Dispense Refill   acetaminophen (TYLENOL) 650 MG CR tablet Take 650 mg by mouth at bedtime. arthritis     Ascorbic Acid (VITAMIN C PO) Take 1 tablet by mouth daily.     cetirizine (ZYRTEC) 10 MG tablet Take 10 mg by mouth at bedtime.     Cholecalciferol (VITAMIN D) 50 MCG (2000 UT) tablet Take 4,000 Units by mouth daily.     fluorouracil (EFUDEX) 5 % cream 1 application to affected areas on face     FLUoxetine (PROZAC) 10 MG capsule Take 1 capsule (10 mg total) by mouth daily. 90 capsule 1   glimepiride (AMARYL) 4 MG tablet TAKE 1 TABLET EVERY DAY WITH BREAKFAST. 90 tablet 1   hydrochlorothiazide (MICROZIDE) 12.5 MG capsule TAKE ONE CAPSULE BY MOUTH DAILY 90 capsule 1   losartan (COZAAR) 100 MG tablet Take 1 tablet (100 mg total) by mouth daily. 90 tablet 1   Melatonin 10 MG CAPS Take by mouth.     metFORMIN (GLUCOPHAGE) 500 MG tablet TAKE TWO TABLETS BY MOUTH TWICE DAILY AFTER MEALS 360 tablet 1   Multiple Vitamin (MULTIVITAMIN) tablet Take 1 tablet by mouth daily.     Naphazoline-Pheniramine (OPCON-A OP) Place 1 drop into both eyes daily as needed (irritation/redness).     naproxen sodium (ALEVE) 220 MG tablet Take 220 mg by mouth daily as needed (shoulder pain).     ONETOUCH ULTRA test strip CHECK BLOOD SUGAR TWICE DAILY 100 strip 0   pravastatin  (PRAVACHOL) 20 MG tablet TAKE ONE TABLET BY MOUTH DAILY 90 tablet 1   triamcinolone (NASACORT) 55 MCG/ACT AERO nasal inhaler Place 2 sprays into the nose at bedtime as needed (congestion).     warfarin (COUMADIN) 2 MG tablet TAKE 1/2 TABLET DAILY EXCEPT TAKE 1 TABLET ON MONDAYS AND FRIDAYS OR TAKE AS DIRECTED BY ANTICOAGULATION CLINIC 75 tablet 1   No current facility-administered medications on file prior to visit.    Review of Systems  Constitutional:  Negative for fever.  Eyes:  Negative for visual disturbance.  Respiratory:  Negative for cough, shortness of breath and wheezing.   Cardiovascular:  Negative for chest pain, palpitations and leg swelling.  Gastrointestinal:  Negative for abdominal pain, blood in stool, constipation and diarrhea.       Seldom gerd  Genitourinary:  Negative for difficulty urinating and dysuria.  Musculoskeletal:  Positive for arthralgias. Negative for back pain.  Skin:  Negative for rash.  Neurological:  Positive for light-headedness (occ if gets up too quick). Negative for headaches.  Psychiatric/Behavioral:  Positive for dysphoric mood (controlled). The patient is nervous/anxious.        Objective:   Vitals:   06/19/22 0948  BP: 130/78  Pulse: (!) 57  Temp:  97.6 F (36.4 C)  SpO2: 97%   Filed Weights   06/19/22 0948  Weight: 183 lb (83 kg)   Body mass index is 26.26 kg/m.  BP Readings from Last 3 Encounters:  06/19/22 130/78  12/31/21 124/72  12/15/21 128/70    Wt Readings from Last 3 Encounters:  06/19/22 183 lb (83 kg)  12/31/21 186 lb (84.4 kg)  12/15/21 189 lb (85.7 kg)      Physical Exam Constitutional: He appears well-developed and well-nourished. No distress.  HENT:  Head: Normocephalic and atraumatic.  Right Ear: External ear normal.  Left Ear: External ear normal.  Mouth/Throat: Oropharynx is clear and moist.  Normal ear canals and TM b/l  Eyes: Conjunctivae and EOM are normal.  Neck: Neck supple. No tracheal  deviation present. No thyromegaly present.  No carotid bruit  Cardiovascular: Normal rate, regular rhythm, normal heart sounds and intact distal pulses.   No murmur heard. Pulmonary/Chest: Effort normal and breath sounds normal. No respiratory distress. He has no wheezes. He has no rales.  Abdominal: Soft. He exhibits no distension. There is no tenderness.  Genitourinary: deferred  Musculoskeletal: He exhibits no edema.  Lymphadenopathy:   He has no cervical adenopathy.  Skin: Skin is warm and dry. He is not diaphoretic.  Psychiatric: He has a normal mood and affect. His behavior is normal.         Assessment & Plan:   Physical exam: Screening blood work  ordered Exercise   - started doing foot peddler machine-encouraged him to continue this.  Discussed the importance of maintaining muscle strength Weight  has lost weight -- good for age  Discussed importance of not overdoing sugars and carbohydrates, which would mean needing to increase or add medication for his diabetes Substance abuse   none   Reviewed recommended immunizations.  Discussed vaccines he can get at the pharmacy.   Health Maintenance  Topic Date Due   DTaP/Tdap/Td (1 - Tdap) Never done   Zoster Vaccines- Shingrix (1 of 2) Never done   FOOT EXAM  11/05/2020   OPHTHALMOLOGY EXAM  02/10/2022   HEMOGLOBIN A1C  06/17/2022   COVID-19 Vaccine (4 - 2023-24 season) 07/05/2022 (Originally 12/26/2021)   Medicare Annual Wellness (AWV)  09/27/2022   Diabetic kidney evaluation - eGFR measurement  12/16/2022   Diabetic kidney evaluation - Urine ACR  12/16/2022   Pneumonia Vaccine 76+ Years old  Completed   INFLUENZA VACCINE  Completed   HPV VACCINES  Aged Out   Hepatitis C Screening  Discontinued   Fecal DNA (Cologuard)  Discontinued     See Problem List for Assessment and Plan of chronic medical problems.

## 2022-06-19 ENCOUNTER — Ambulatory Visit (INDEPENDENT_AMBULATORY_CARE_PROVIDER_SITE_OTHER): Payer: PPO | Admitting: Internal Medicine

## 2022-06-19 ENCOUNTER — Encounter: Payer: Self-pay | Admitting: Internal Medicine

## 2022-06-19 VITALS — BP 130/78 | HR 57 | Temp 97.6°F | Ht 70.0 in | Wt 183.0 lb

## 2022-06-19 DIAGNOSIS — D6869 Other thrombophilia: Secondary | ICD-10-CM

## 2022-06-19 DIAGNOSIS — I4891 Unspecified atrial fibrillation: Secondary | ICD-10-CM | POA: Diagnosis not present

## 2022-06-19 DIAGNOSIS — Z0000 Encounter for general adult medical examination without abnormal findings: Secondary | ICD-10-CM | POA: Diagnosis not present

## 2022-06-19 DIAGNOSIS — E1159 Type 2 diabetes mellitus with other circulatory complications: Secondary | ICD-10-CM

## 2022-06-19 DIAGNOSIS — E7849 Other hyperlipidemia: Secondary | ICD-10-CM | POA: Diagnosis not present

## 2022-06-19 DIAGNOSIS — I10 Essential (primary) hypertension: Secondary | ICD-10-CM

## 2022-06-19 DIAGNOSIS — N1831 Chronic kidney disease, stage 3a: Secondary | ICD-10-CM

## 2022-06-19 DIAGNOSIS — F3289 Other specified depressive episodes: Secondary | ICD-10-CM | POA: Diagnosis not present

## 2022-06-19 LAB — COMPREHENSIVE METABOLIC PANEL
ALT: 18 U/L (ref 0–53)
AST: 24 U/L (ref 0–37)
Albumin: 4.2 g/dL (ref 3.5–5.2)
Alkaline Phosphatase: 63 U/L (ref 39–117)
BUN: 18 mg/dL (ref 6–23)
CO2: 28 mEq/L (ref 19–32)
Calcium: 9.8 mg/dL (ref 8.4–10.5)
Chloride: 97 mEq/L (ref 96–112)
Creatinine, Ser: 1.23 mg/dL (ref 0.40–1.50)
GFR: 55.91 mL/min — ABNORMAL LOW (ref >60.00)
Glucose, Bld: 145 mg/dL — ABNORMAL HIGH (ref 70–99)
Potassium: 4.5 mEq/L (ref 3.5–5.1)
Sodium: 135 mEq/L (ref 135–145)
Total Bilirubin: 0.9 mg/dL (ref 0.2–1.2)
Total Protein: 7 g/dL (ref 6.0–8.3)

## 2022-06-19 LAB — CBC WITH DIFFERENTIAL/PLATELET
Basophils Absolute: 0 10*3/uL (ref 0.0–0.1)
Basophils Relative: 0.7 % (ref 0.0–3.0)
Eosinophils Absolute: 0.3 10*3/uL (ref 0.0–0.7)
Eosinophils Relative: 4.2 % (ref 0.0–5.0)
HCT: 43.2 % (ref 39.0–52.0)
Hemoglobin: 14.1 g/dL (ref 13.0–17.0)
Lymphocytes Relative: 23.8 % (ref 12.0–46.0)
Lymphs Abs: 1.6 10*3/uL (ref 0.7–4.0)
MCHC: 32.7 g/dL (ref 30.0–36.0)
MCV: 87.2 fl (ref 78.0–100.0)
Monocytes Absolute: 0.6 10*3/uL (ref 0.1–1.0)
Monocytes Relative: 9.2 % (ref 3.0–12.0)
Neutro Abs: 4.2 10*3/uL (ref 1.4–7.7)
Neutrophils Relative %: 62.1 % (ref 43.0–77.0)
Platelets: 234 10*3/uL (ref 150.0–400.0)
RBC: 4.95 Mil/uL (ref 4.22–5.81)
RDW: 14.3 % (ref 11.5–15.5)
WBC: 6.8 10*3/uL (ref 4.0–10.5)

## 2022-06-19 LAB — LIPID PANEL
Cholesterol: 151 mg/dL (ref 0–200)
HDL: 45.9 mg/dL (ref >39.00)
LDL Cholesterol: 73 mg/dL (ref 0–99)
NonHDL: 105.25
Total CHOL/HDL Ratio: 3
Triglycerides: 162 mg/dL — ABNORMAL HIGH (ref 0.0–149.0)
VLDL: 32.4 mg/dL (ref 0.0–40.0)

## 2022-06-19 LAB — HEMOGLOBIN A1C: Hgb A1c MFr Bld: 8.6 % — ABNORMAL HIGH (ref 4.6–6.5)

## 2022-06-19 NOTE — Assessment & Plan Note (Addendum)
Chronic   Lab Results  Component Value Date   HGBA1C 8.2 (H) 12/15/2021   Sugars not ideally controlled Check A1c Continue glimepiride 4 mg daily, metformin 1000 mg twice daily Stressed regular exercise, diabetic diet Continue regular exercise-increase swelling

## 2022-06-19 NOTE — Assessment & Plan Note (Addendum)
Chronic Stable Taking 1 Aleve a day-encouraged not to take more than this and not take it daily if he does not need it Stressed good water intake Stressed good blood pressure and sugar control CMP

## 2022-06-19 NOTE — Assessment & Plan Note (Signed)
Chronic Secondary to atrial fibrillation On warfarin therapy

## 2022-06-19 NOTE — Assessment & Plan Note (Signed)
Chronic Controlled, Stable Continue fluoxetine 10 mg daily

## 2022-06-19 NOTE — Assessment & Plan Note (Signed)
Chronic Blood pressure well controlled CMP Continue losartan 100 mg daily, HCTZ 12.5 mg daily

## 2022-06-19 NOTE — Assessment & Plan Note (Signed)
Chronic Regular exercise and healthy diet encouraged Check lipid panel  Continue pravastatin 20 mg daily

## 2022-06-19 NOTE — Assessment & Plan Note (Signed)
Chronic Following with cardiology On warfarin-managed by our Coumadin nurse Asymptomatic CBC, CMP

## 2022-06-30 ENCOUNTER — Ambulatory Visit: Payer: PPO

## 2022-07-03 ENCOUNTER — Ambulatory Visit (INDEPENDENT_AMBULATORY_CARE_PROVIDER_SITE_OTHER): Payer: PPO

## 2022-07-03 DIAGNOSIS — Z7901 Long term (current) use of anticoagulants: Secondary | ICD-10-CM | POA: Diagnosis not present

## 2022-07-03 LAB — POCT INR: INR: 3.7 — AB (ref 2.0–3.0)

## 2022-07-03 NOTE — Patient Instructions (Addendum)
Pre visit review using our clinic review tool, if applicable. No additional management support is needed unless otherwise documented below in the visit note.  Hold dose today and then change weekly dose to take 1/2 tablet daily except 1 on Fridays.  Recheck in 3 weeks.

## 2022-07-03 NOTE — Progress Notes (Signed)
Pt has stopped taking Aleve but is taking a new sleeping supplement, Relaxium Sleep. Ingredients in sleep supplement do not interact with warfarin.  Hold dose today and then change weekly dose to take 1/2 tablet daily except 1 on Fridays.  Recheck in 2 weeks.

## 2022-07-14 ENCOUNTER — Ambulatory Visit (INDEPENDENT_AMBULATORY_CARE_PROVIDER_SITE_OTHER): Payer: PPO

## 2022-07-14 DIAGNOSIS — Z7901 Long term (current) use of anticoagulants: Secondary | ICD-10-CM

## 2022-07-14 LAB — POCT INR: INR: 2.8 (ref 2.0–3.0)

## 2022-07-14 NOTE — Progress Notes (Addendum)
Continue 1/2 tablet daily except take 1 tablet on Fridays. Recheck in 6 weeks.

## 2022-07-14 NOTE — Patient Instructions (Addendum)
Pre visit review using our clinic review tool, if applicable. No additional management support is needed unless otherwise documented below in the visit note.  Continue 1/2 tablet daily except take 1 tablet on Fridays.  Recheck in 6 weeks. 

## 2022-08-11 ENCOUNTER — Other Ambulatory Visit: Payer: Self-pay | Admitting: Internal Medicine

## 2022-08-11 DIAGNOSIS — Z7901 Long term (current) use of anticoagulants: Secondary | ICD-10-CM

## 2022-08-11 NOTE — Telephone Encounter (Signed)
Pt is compliant with warfarin management and PCP apts.  Sent in refill of warfarin to requested pharmacy.      

## 2022-08-18 ENCOUNTER — Ambulatory Visit (INDEPENDENT_AMBULATORY_CARE_PROVIDER_SITE_OTHER): Payer: PPO

## 2022-08-18 DIAGNOSIS — I442 Atrioventricular block, complete: Secondary | ICD-10-CM

## 2022-08-18 LAB — CUP PACEART REMOTE DEVICE CHECK
Battery Remaining Longevity: 107 mo
Battery Remaining Percentage: 83 %
Battery Voltage: 3.01 V
Brady Statistic RV Percent Paced: 88 %
Date Time Interrogation Session: 20240423032118
Implantable Lead Connection Status: 753985
Implantable Lead Connection Status: 753985
Implantable Lead Implant Date: 20100216
Implantable Lead Implant Date: 20100216
Implantable Lead Location: 753859
Implantable Lead Location: 753860
Implantable Pulse Generator Implant Date: 20211014
Lead Channel Impedance Value: 460 Ohm
Lead Channel Pacing Threshold Amplitude: 1 V
Lead Channel Pacing Threshold Pulse Width: 0.5 ms
Lead Channel Sensing Intrinsic Amplitude: 12 mV
Lead Channel Setting Pacing Amplitude: 1.25 V
Lead Channel Setting Pacing Pulse Width: 0.5 ms
Lead Channel Setting Sensing Sensitivity: 2 mV
Pulse Gen Model: 2272
Pulse Gen Serial Number: 3873301

## 2022-08-25 ENCOUNTER — Telehealth: Payer: Self-pay | Admitting: Internal Medicine

## 2022-08-25 ENCOUNTER — Ambulatory Visit (INDEPENDENT_AMBULATORY_CARE_PROVIDER_SITE_OTHER): Payer: PPO

## 2022-08-25 DIAGNOSIS — Z7901 Long term (current) use of anticoagulants: Secondary | ICD-10-CM | POA: Diagnosis not present

## 2022-08-25 LAB — POCT INR: INR: 2.1 (ref 2.0–3.0)

## 2022-08-25 NOTE — Progress Notes (Signed)
Continue 1/2 tablet daily except take 1 on tablet Fridays. Recheck in 6 weeks.

## 2022-08-25 NOTE — Patient Instructions (Addendum)
Pre visit review using our clinic review tool, if applicable. No additional management support is needed unless otherwise documented below in the visit note.  Continue 1/2 tablet daily except take 1 on tablet Fridays. Recheck in 6 weeks.

## 2022-08-25 NOTE — Telephone Encounter (Signed)
Contacted Albert Hawkins to schedule their annual wellness visit. Call back at later date: 08/25/2022  [facility] Care Guide [facility] AWV TEAM Direct Dial: [phone]

## 2022-08-26 ENCOUNTER — Other Ambulatory Visit: Payer: Self-pay | Admitting: Internal Medicine

## 2022-09-02 ENCOUNTER — Telehealth: Payer: Self-pay | Admitting: Internal Medicine

## 2022-09-02 NOTE — Telephone Encounter (Signed)
Contacted Albert Hawkins to schedule their annual wellness visit. Appointment made for 09/14/2022.  [facility] Care Guide [facility] AWV TEAM Direct Dial: [phone]

## 2022-09-15 NOTE — Progress Notes (Signed)
Remote pacemaker transmission.   

## 2022-09-23 DIAGNOSIS — D485 Neoplasm of uncertain behavior of skin: Secondary | ICD-10-CM | POA: Diagnosis not present

## 2022-09-23 DIAGNOSIS — L570 Actinic keratosis: Secondary | ICD-10-CM | POA: Diagnosis not present

## 2022-09-23 DIAGNOSIS — D225 Melanocytic nevi of trunk: Secondary | ICD-10-CM | POA: Diagnosis not present

## 2022-09-23 DIAGNOSIS — Z85828 Personal history of other malignant neoplasm of skin: Secondary | ICD-10-CM | POA: Diagnosis not present

## 2022-09-23 DIAGNOSIS — L814 Other melanin hyperpigmentation: Secondary | ICD-10-CM | POA: Diagnosis not present

## 2022-09-23 DIAGNOSIS — L821 Other seborrheic keratosis: Secondary | ICD-10-CM | POA: Diagnosis not present

## 2022-09-23 DIAGNOSIS — L820 Inflamed seborrheic keratosis: Secondary | ICD-10-CM | POA: Diagnosis not present

## 2022-09-23 DIAGNOSIS — C44329 Squamous cell carcinoma of skin of other parts of face: Secondary | ICD-10-CM | POA: Diagnosis not present

## 2022-09-23 DIAGNOSIS — L578 Other skin changes due to chronic exposure to nonionizing radiation: Secondary | ICD-10-CM | POA: Diagnosis not present

## 2022-09-28 NOTE — Progress Notes (Unsigned)
Subjective:   Albert Hawkins is a 82 y.o. male who presents for Medicare Annual/Subsequent preventive examination.  Review of Systems    ***       Objective:    There were no vitals filed for this visit. There is no height or weight on file to calculate BMI.     09/26/2021    2:37 PM 09/24/2020   11:22 AM 02/08/2020   12:06 PM 08/24/2019   11:48 AM  Advanced Directives  Does Patient Have a Medical Advance Directive? Yes Yes No Yes  Type of Advance Directive Living will Living will;Healthcare Power of Attorney  Living will;Healthcare Power of Attorney  Does patient want to make changes to medical advance directive? No - Patient declined No - Patient declined  No - Patient declined  Copy of Healthcare Power of Attorney in Chart?  No - copy requested  No - copy requested  Would patient like information on creating a medical advance directive?   No - Patient declined     Current Medications (verified) Outpatient Encounter Medications as of 09/29/2022  Medication Sig   acetaminophen (TYLENOL) 650 MG CR tablet Take 650 mg by mouth at bedtime. arthritis   Ascorbic Acid (VITAMIN C PO) Take 1 tablet by mouth daily.   cetirizine (ZYRTEC) 10 MG tablet Take 10 mg by mouth at bedtime.   Cholecalciferol (VITAMIN D) 50 MCG (2000 UT) tablet Take 4,000 Units by mouth daily.   fluorouracil (EFUDEX) 5 % cream 1 application to affected areas on face   FLUoxetine (PROZAC) 10 MG capsule Take 1 capsule (10 mg total) by mouth daily.   glimepiride (AMARYL) 4 MG tablet TAKE 1 TABLET EVERY DAY WITH BREAKFAST.   hydrochlorothiazide (MICROZIDE) 12.5 MG capsule TAKE ONE CAPSULE BY MOUTH DAILY   losartan (COZAAR) 100 MG tablet Take 1 tablet (100 mg total) by mouth daily.   Melatonin 10 MG CAPS Take by mouth.   metFORMIN (GLUCOPHAGE) 500 MG tablet TAKE TWO TABLETS BY MOUTH TWICE DAILY AFTER MEALS   Multiple Vitamin (MULTIVITAMIN) tablet Take 1 tablet by mouth daily.   Naphazoline-Pheniramine (OPCON-A OP)  Place 1 drop into both eyes daily as needed (irritation/redness).   naproxen sodium (ALEVE) 220 MG tablet Take 220 mg by mouth daily as needed (shoulder pain).   ONETOUCH ULTRA test strip CHECK BLOOD SUGAR TWICE DAILY   pravastatin (PRAVACHOL) 20 MG tablet TAKE ONE TABLET BY MOUTH DAILY   triamcinolone (NASACORT) 55 MCG/ACT AERO nasal inhaler Place 2 sprays into the nose at bedtime as needed (congestion).   warfarin (COUMADIN) 2 MG tablet TAKE 1/2 TABLET BY MOUTH DAILY EXCEPT TAKE 1 TABLET ON FRIDAYS OR AS DIRECTED BY ANTICOAGULATION CLINIC   No facility-administered encounter medications on file as of 09/29/2022.    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 Hillis Range   PPM GENERATOR CHANGEOUT N/A 02/08/2020   Procedure: PPM GENERATOR CHANGEOUT;  Surgeon: Hillis Range, MD;  Location: [facility];  Service: Cardiovascular;  Laterality: N/A;   SKIN CANCER EXCISION     above nose    Family History  Problem Relation Age of Onset   Heart attack Paternal Grandfather 69   Diabetes Paternal Uncle    Diabetes  Maternal Grandfather    Skin cancer Father    Emphysema Father    Coronary artery disease Father        stent   Diabetes Maternal Aunt    Diabetes Maternal Uncle    Stroke Neg Hx    Social History   Socioeconomic History   Marital status: Married    Spouse name: Not on file   Number of children: Not on file   Years of education: Not on file   Highest education level: Not on file  Occupational History   Not on file  Tobacco Use   Smoking status: Former    Packs/day: 2.00    Years: 10.00    Additional pack years: 0.00    Total pack years: 20.00    Types: Cigarettes    Quit date: 04/27/1985    Years since quitting: 37.4   Smokeless tobacco: Never   Tobacco comments:    smoked age [age], maximum regular consumption up to 1 ppd  Vaping Use   Vaping Use: Never used  Substance and Sexual Activity   Alcohol use: No   Drug use: No   Sexual activity: Not on file  Other Topics Concern   Not on file  Social History Narrative   Not on file   Social Determinants of Health   Financial Resource Strain: Low Risk  (09/26/2021)   Overall Financial Resource Strain (CARDIA)    Difficulty of Paying Living Expenses: Not hard at all  Food Insecurity: No Food Insecurity (09/26/2021)   Hunger Vital Sign    Worried About Running Out of Food in the Last Year: Never true    Ran Out of Food in the Last Year: Never true  Transportation Needs: No Transportation Needs (09/26/2021)   PRAPARE - Administrator, Civil Service (Medical): No    Lack of Transportation (Non-Medical): No  Physical Activity: Inactive (09/26/2021)   Exercise Vital Sign    Days of Exercise per Week: 0 days    Minutes of Exercise per Session: 0 min  Stress: No Stress Concern Present (09/26/2021)   [employer] of Occupational Health - Occupational Stress Questionnaire    Feeling of Stress : Not at all  Social Connections: Socially Integrated (09/26/2021)   Social Connection and Isolation Panel [NHANES]     Frequency of Communication with Friends and Family: More than three times a week    Frequency of Social Gatherings with Friends and Family: More than three times a week    Attends Religious Services: More than 4 times per year    Active Member of [employer] or Organizations: Yes    Attends Banker Meetings: More than 4 times per  year    Marital Status: Married    Tobacco Counseling Counseling given: Not Answered Tobacco comments: smoked age [age], maximum regular consumption up to 1 ppd   Clinical Intake:              How often do you need to have someone help you when you read instructions, pamphlets, or other written materials from your doctor or pharmacy?: (P) 1 - Never  Diabetic?Yes   Nutrition Risk Assessment:  Has the patient had any N/V/D within the last 2 months?  {YES/NO:21197} Does the patient have any non-healing wounds?  {YES/NO:21197} Has the patient had any unintentional weight loss or weight gain?  {YES/NO:21197}  Diabetes:  Is the patient diabetic?  {YES/NO:21197} If diabetic, was a CBG obtained today?  {YES/NO:21197} Did the patient bring in their glucometer from home?  {YES/NO:21197} How often do you monitor your CBG's? ***.   Financial Strains and Diabetes Management:  Are you having any financial strains with the device, your supplies or your medication? {YES/NO:21197}.  Does the patient want to be seen by Chronic Care Management for management of their diabetes?  {YES/NO:21197} Would the patient like to be referred to a Nutritionist or for Diabetic Management?  {YES/NO:21197}  Diabetic Exams:  {Diabetic Eye Exam:2101801} {Diabetic Foot Exam:2101802}          Activities of Daily Living    09/25/2022    4:02 PM 09/10/2022    9:30 AM  In your present state of health, do you have any difficulty performing the following activities:  Hearing? 0 0  Vision? 0 0  Difficulty concentrating or making decisions? 0 0  Walking or climbing  stairs? 0 0  Dressing or bathing? 0 0  Doing errands, shopping? 0 0  Preparing Food and eating ? N N  Using the Toilet? N N  In the past six months, have you accidently leaked urine? N N  Do you have problems with loss of bowel control? N N  Managing your Medications? N N  Managing your Finances? N N  Housekeeping or managing your Housekeeping? N N    Patient Care Team: Pincus Sanes, MD as PCP - General (Internal Medicine) Kathyrn Sheriff, [facility] (Pharmacist) Davina Poke as Consulting Physician (Optometry)  Indicate any recent Medical Services you may have received from other than [facility] providers in the past year (date may be approximate).     Assessment:   This is a routine wellness examination for Albert Hawkins.  Hearing/Vision screen No results found.  Dietary issues and exercise activities discussed:     Goals Addressed   None    Depression Screen    06/19/2022    9:48 AM 12/15/2021    1:09 PM 09/26/2021    2:39 PM 09/24/2020   11:23 AM 11/06/2019   11:09 AM 08/24/2019   11:49 AM 05/08/2019    1:09 PM  PHQ 2/9 Scores  PHQ - 2 Score 0 1 0 0 0 0 0  PHQ- 9 Score 5 4   1  2     Fall Risk    09/25/2022    4:02 PM 09/10/2022    9:30 AM 06/19/2022    9:48 AM 12/15/2021    1:09 PM 09/26/2021    2:39 PM  Fall Risk   Falls in the past year? 0 0 0 0 0  Number falls in past yr: 0 0 0 0 0  Injury with Fall? 0 0 0 0 0  Risk for fall due  to :   No Fall Risks No Fall Risks No Fall Risks  Follow up   Falls evaluation completed Falls evaluation completed Falls evaluation completed    FALL RISK PREVENTION PERTAINING TO THE HOME:  Any stairs in or around the home? {YES/NO:21197} If so, are there any without handrails? {YES/NO:21197} Home free of loose throw rugs in walkways, pet beds, electrical cords, etc? {YES/NO:21197} Adequate lighting in your home to reduce risk of falls? {YES/NO:21197}  ASSISTIVE DEVICES UTILIZED TO PREVENT FALLS:  Life alert? {YES/NO:21197} Use of a  cane, walker or w/c? {YES/NO:21197} Grab bars in the bathroom? {YES/NO:21197} Shower chair or bench in shower? {YES/NO:21197} Elevated toilet seat or a handicapped toilet? {YES/NO:21197}  TIMED UP AND GO:  Was the test performed? No . Telephonic visit   Cognitive Function:        09/26/2021    2:45 PM  6CIT Screen  What Year? 0 points  What month? 0 points  What time? 0 points  Count back from 20 0 points  Months in reverse 0 points  Repeat phrase 0 points  Total Score 0 points    Immunizations Immunization History  Administered Date(s) Administered   Fluad Quad(high Dose 65+) 02/17/2019, 02/06/2020, 01/30/2021, 02/10/2022   Influenza Split 02/12/2011, 02/02/2012   Influenza Whole 02/25/2006, 04/14/2007, 02/16/2008, 02/18/2009, 02/03/2010   Influenza, High Dose Seasonal PF 02/02/2014, 01/22/2015, 01/24/2016, 01/15/2017, 01/25/2018   Influenza-Unspecified 01/25/2013   PFIZER(Purple Top)SARS-COV-2 Vaccination 05/16/2019, 06/05/2019, 05/03/2020   Pneumococcal Conjugate-13 12/04/2014   Pneumococcal Polysaccharide-23 04/01/2009    {TDAP status:2101805}  Pneumococcal vaccine status: Up to date  Covid-19 vaccine status: Information provided on how to obtain vaccines.   Qualifies for Shingles Vaccine? Yes   Zostavax completed No   Shingrix Completed?: No.    Education has been provided regarding the importance of this vaccine. Patient has been advised to call insurance company to determine out of pocket expense if they have not yet received this vaccine. Advised may also receive vaccine at local pharmacy or Health Dept. Verbalized acceptance and understanding.  Screening Tests Health Maintenance  Topic Date Due   DTaP/Tdap/Td (1 - Tdap) Never done   Zoster Vaccines- Shingrix (1 of 2) Never done   FOOT EXAM  11/05/2020   COVID-19 Vaccine (4 - 2023-24 season) 12/26/2021   OPHTHALMOLOGY EXAM  02/10/2022   Medicare Annual Wellness (AWV)  09/27/2022   INFLUENZA VACCINE   11/26/2022   Diabetic kidney evaluation - Urine ACR  12/16/2022   HEMOGLOBIN A1C  12/18/2022   Diabetic kidney evaluation - eGFR measurement  06/20/2023   Pneumonia Vaccine 57+ Years old  Completed   HPV VACCINES  Aged Out   Hepatitis C Screening  Discontinued   Fecal DNA (Cologuard)  Discontinued    Health Maintenance  Health Maintenance Due  Topic Date Due   DTaP/Tdap/Td (1 - Tdap) Never done   Zoster Vaccines- Shingrix (1 of 2) Never done   FOOT EXAM  11/05/2020   COVID-19 Vaccine (4 - 2023-24 season) 12/26/2021   OPHTHALMOLOGY EXAM  02/10/2022   Medicare Annual Wellness (AWV)  09/27/2022    Colorectal cancer screening: No longer required.   Lung Cancer Screening: (Low Dose CT Chest recommended if Age 26-80 years, 30 pack-year currently smoking OR have quit w/in 15years.) does not qualify.   Lung Cancer Screening Referral: n/a  Additional Screening:  Hepatitis C Screening: does qualify; Patient declines   Vision Screening: Recommended annual ophthalmology exams for early detection of glaucoma and other disorders of  the eye. Is the patient up to date with their annual eye exam?  {YES/NO:21197} Who is the provider or what is the name of the office in which the patient attends annual eye exams? *** If pt is not established with a provider, would they like to be referred to a provider to establish care? {YES/NO:21197}.   Dental Screening: Recommended annual dental exams for proper oral hygiene  Community Resource Referral / Chronic Care Management: CRR required this visit?  {YES/NO:21197}  CCM required this visit?  {YES/NO:21197}     Plan:     I have personally reviewed and noted the following in the patient's chart:   Medical and social history Use of alcohol, tobacco or illicit drugs  Current medications and supplements including opioid prescriptions. {Opioid [phone]} Functional ability and status Nutritional status Physical activity Advanced  directives List of other physicians Hospitalizations, surgeries, and ER visits in previous 12 months Vitals Screenings to include cognitive, depression, and falls Referrals and appointments  In addition, I have reviewed and discussed with patient certain preventive protocols, quality metrics, and best practice recommendations. A written personalized care plan for preventive services as well as general preventive health recommendations were provided to patient.     Durwin Nora, [place]   05/02/1094   Due to this being a virtual visit, the after visit summary with patients personalized plan was offered to patient via mail or my-chart. ***Patient declined at this time./ Patient would like to access on my-chart/ per request, patient was mailed a copy of AVS./ Patient preferred to pick up at office at next visit  Nurse Notes: ***

## 2022-09-28 NOTE — Patient Instructions (Incomplete)
Albert Hawkins , Thank you for taking time to come for your Medicare Wellness Visit. I appreciate your ongoing commitment to your health goals. Please review the following plan we discussed and let me know if I can assist you in the future.   These are the goals we discussed:  Goals       Cut out extra servings (pt-stated)      To lose a couple of pounds; would like to be at 190.      Remain active and independent        This is a list of the screening recommended for you and due dates:  Health Maintenance  Topic Date Due   DTaP/Tdap/Td vaccine (1 - Tdap) Never done   Zoster (Shingles) Vaccine (1 of 2) Never done   Complete foot exam   11/05/2020   COVID-19 Vaccine (4 - 2023-24 season) 12/26/2021   Eye exam for diabetics  02/10/2022   Flu Shot  11/26/2022   Yearly kidney health urinalysis for diabetes  12/16/2022   Hemoglobin A1C  12/18/2022   Yearly kidney function blood test for diabetes  06/20/2023   Medicare Annual Wellness Visit  09/29/2023   Pneumonia Vaccine  Completed   HPV Vaccine  Aged Out   Hepatitis C Screening  Discontinued   Cologuard (Stool DNA test)  Discontinued    Advanced directives: Information on Advanced Care Planning can be found at [facility] Advance Health Care Directives Advance Health Care Directives ([email or web address]  Please bring a copy of your health care power of attorney and living will to the office to be added to your chart at your convenience.   Conditions/risks identified: Aim for 30 minutes of exercise or brisk walking, 6-8 glasses of water, and 5 servings of fruits and vegetables each day.   Next appointment: Follow up in one year for your annual wellness visit.   Preventive Care 59 Years and Older, Male  Preventive care refers to lifestyle choices and visits with your health care provider that can promote health and wellness. What does preventive care include? A yearly physical exam. This is also called an annual well  check. Dental exams once or twice a year. Routine eye exams. Ask your health care provider how often you should have your eyes checked. Personal lifestyle choices, including: Daily care of your teeth and gums. Regular physical activity. Eating a healthy diet. Avoiding tobacco and drug use. Limiting alcohol use. Practicing safe sex. Taking low doses of aspirin every day. Taking vitamin and mineral supplements as recommended by your health care provider. What happens during an annual well check? The services and screenings done by your health care provider during your annual well check will depend on your age, overall health, lifestyle risk factors, and family history of disease. Counseling  Your health care provider may ask you questions about your: Alcohol use. Tobacco use. Drug use. Emotional well-being. Home and relationship well-being. Sexual activity. Eating habits. History of falls. Memory and ability to understand (cognition). Work and work Astronomer. Screening  You may have the following tests or measurements: Height, weight, and BMI. Blood pressure. Lipid and cholesterol levels. These may be checked every 5 years, or more frequently if you are over 41 years old. Skin check. Lung cancer screening. You may have this screening every year starting at age 63 if you have a 30-pack-year history of smoking and currently smoke or have quit within the past 15 years. Fecal occult blood test (FOBT) of the  stool. You may have this test every year starting at age 55. Flexible sigmoidoscopy or colonoscopy. You may have a sigmoidoscopy every 5 years or a colonoscopy every 10 years starting at age 48. Prostate cancer screening. Recommendations will vary depending on your family history and other risks. Hepatitis C blood test. Hepatitis B blood test. Sexually transmitted disease (STD) testing. Diabetes screening. This is done by checking your blood sugar (glucose) after you have not  eaten for a while (fasting). You may have this done every 1-3 years. Abdominal aortic aneurysm (AAA) screening. You may need this if you are a current or former smoker. Osteoporosis. You may be screened starting at age 95 if you are at high risk. Talk with your health care provider about your test results, treatment options, and if necessary, the need for more tests. Vaccines  Your health care provider may recommend certain vaccines, such as: Influenza vaccine. This is recommended every year. Tetanus, diphtheria, and acellular pertussis (Tdap, Td) vaccine. You may need a Td booster every 10 years. Zoster vaccine. You may need this after age 11. Pneumococcal 13-valent conjugate (PCV13) vaccine. One dose is recommended after age 66. Pneumococcal polysaccharide (PPSV23) vaccine. One dose is recommended after age 37. Talk to your health care provider about which screenings and vaccines you need and how often you need them. This information is not intended to replace advice given to you by your health care provider. Make sure you discuss any questions you have with your health care provider. Document Released: 05/10/2015 Document Revised: 01/01/2016 Document Reviewed: 02/12/2015 Elsevier Interactive Patient Education  2017 [employer].  Fall Prevention in the Home Falls can cause injuries. They can happen to people of all ages. There are many things you can do to make your home safe and to help prevent falls. What can I do on the outside of my home? Regularly fix the edges of walkways and driveways and fix any cracks. Remove anything that might make you trip as you walk through a door, such as a raised step or threshold. Trim any bushes or trees on the path to your home. Use bright outdoor lighting. Clear any walking paths of anything that might make someone trip, such as rocks or tools. Regularly check to see if handrails are loose or broken. Make sure that both sides of any steps have  handrails. Any raised decks and porches should have guardrails on the edges. Have any leaves, snow, or ice cleared regularly. Use sand or salt on walking paths during winter. Clean up any spills in your garage right away. This includes oil or grease spills. What can I do in the bathroom? Use night lights. Install grab bars by the toilet and in the tub and shower. Do not use towel bars as grab bars. Use non-skid mats or decals in the tub or shower. If you need to sit down in the shower, use a plastic, non-slip stool. Keep the floor dry. Clean up any water that spills on the floor as soon as it happens. Remove soap buildup in the tub or shower regularly. Attach bath mats securely with double-sided non-slip rug tape. Do not have throw rugs and other things on the floor that can make you trip. What can I do in the bedroom? Use night lights. Make sure that you have a light by your bed that is easy to reach. Do not use any sheets or blankets that are too big for your bed. They should not hang down onto the floor.  Have a firm chair that has side arms. You can use this for support while you get dressed. Do not have throw rugs and other things on the floor that can make you trip. What can I do in the kitchen? Clean up any spills right away. Avoid walking on wet floors. Keep items that you use a lot in easy-to-reach places. If you need to reach something above you, use a strong step stool that has a grab bar. Keep electrical cords out of the way. Do not use floor polish or wax that makes floors slippery. If you must use wax, use non-skid floor wax. Do not have throw rugs and other things on the floor that can make you trip. What can I do with my stairs? Do not leave any items on the stairs. Make sure that there are handrails on both sides of the stairs and use them. Fix handrails that are broken or loose. Make sure that handrails are as long as the stairways. Check any carpeting to make sure  that it is firmly attached to the stairs. Fix any carpet that is loose or worn. Avoid having throw rugs at the top or bottom of the stairs. If you do have throw rugs, attach them to the floor with carpet tape. Make sure that you have a light switch at the top of the stairs and the bottom of the stairs. If you do not have them, ask someone to add them for you. What else can I do to help prevent falls? Wear shoes that: Do not have high heels. Have rubber bottoms. Are comfortable and fit you well. Are closed at the toe. Do not wear sandals. If you use a stepladder: Make sure that it is fully opened. Do not climb a closed stepladder. Make sure that both sides of the stepladder are locked into place. Ask someone to hold it for you, if possible. Clearly mark and make sure that you can see: Any grab bars or handrails. First and last steps. Where the edge of each step is. Use tools that help you move around (mobility aids) if they are needed. These include: Canes. Walkers. Scooters. Crutches. Turn on the lights when you go into a dark area. Replace any light bulbs as soon as they burn out. Set up your furniture so you have a clear path. Avoid moving your furniture around. If any of your floors are uneven, fix them. If there are any pets around you, be aware of where they are. Review your medicines with your doctor. Some medicines can make you feel dizzy. This can increase your chance of falling. Ask your doctor what other things that you can do to help prevent falls. This information is not intended to replace advice given to you by your health care provider. Make sure you discuss any questions you have with your health care provider. Document Released: 02/07/2009 Document Revised: 09/19/2015 Document Reviewed: 05/18/2014 Elsevier Interactive Patient Education  2017 [employer].

## 2022-09-29 ENCOUNTER — Ambulatory Visit (INDEPENDENT_AMBULATORY_CARE_PROVIDER_SITE_OTHER): Payer: PPO

## 2022-09-29 VITALS — Ht 70.0 in | Wt 183.0 lb

## 2022-09-29 DIAGNOSIS — Z0000 Encounter for general adult medical examination without abnormal findings: Secondary | ICD-10-CM | POA: Diagnosis not present

## 2022-10-05 ENCOUNTER — Other Ambulatory Visit: Payer: Self-pay | Admitting: Internal Medicine

## 2022-10-06 ENCOUNTER — Ambulatory Visit (INDEPENDENT_AMBULATORY_CARE_PROVIDER_SITE_OTHER): Payer: PPO

## 2022-10-06 DIAGNOSIS — Z7901 Long term (current) use of anticoagulants: Secondary | ICD-10-CM

## 2022-10-06 LAB — POCT INR: INR: 2 (ref 2.0–3.0)

## 2022-10-06 NOTE — Progress Notes (Signed)
Continue 1/2 tablet daily except take 1 tablet on Fridays. Recheck in 6 weeks. 

## 2022-10-06 NOTE — Patient Instructions (Addendum)
Pre visit review using our clinic review tool, if applicable. No additional management support is needed unless otherwise documented below in the visit note.  Continue 1/2 tablet daily except take 1 tablet on Fridays.  Recheck in 6 weeks. 

## 2022-10-11 NOTE — Progress Notes (Unsigned)
  Cardiology Office Note:  .   Date:  10/12/2022  ID:  Albert Hawkins, DOB 1940/09/30, MRN [id number] PCP: Pincus Sanes, MD   [facility]Care Providers Cardiologist:  None Electrophysiologist:  Will Jorja Loa, MD     History of Present Illness: .   Albert Hawkins is a 82 y.o. male with PMH of DM, HTN, HLD, Permanent AF, Mobitz II AVB s/p PPM.    He was previously followed by Dr. Johney Frame, transitioned to Dr. Elberta Fortis. PPM placed in 2010, largely asymptomatic since. He is on coumadin for anticoagulation. He typically is unaware of his AFib. Had COVID in 2021 which was followed by a decline in his regular exercise. Routine follow up in 2023 with EP found him doing well.    Routine OV 6/17 finds him well. He reports he is stable on his medications & no acute events recently. He relays "I am in better shape than my wife".  Denies lightheadedness, dizziness. No events of chest pain, heart racing, syncope.   ROS: Denies shortness of breath, chest pain, dizziness, lightheadedness. Negative otherwise unless mentioned in HPI.   EP Info / Studies Reviewed: .    Studies ECHO 04/2018 > LVEF 55-60%, no RWMA, LA severely dilated, PASP 31 EKG 09/2021 AF, VP rate 60 EKG 10/12/2022 >AF, Vpaced 66  Device Information: SJM dual chamber PPM, implanted 06/12/2008, gen change 02/08/2020. Programmed VVIR, unipolar (chronically) PaceArt 07/2022 > normal device function, mode: VVIR, RV 88% pacing Pacemaker Interrogation 10/12/22 > Presenting Vpaced, battery & lead measurements stable, no high vent rate episodes, VPacing 87%, dependent at 40  AAD/Anticoagulation:  Coumadin, followed by Dr. Lawerance Bach  Risk Assessment/Calculations:    CHA2DS2-VASc Score = 4   This indicates a 4.8% annual risk of stroke. The patient's score is based upon: CHF History: 0 HTN History: 1 Diabetes History: 1 Stroke History: 0 Vascular Disease History: 0 Age Score: 2 Gender Score: 0            Physical Exam:   VS:  BP  128/70   Pulse 66   Ht 5\' 10"  (1.778 m)   Wt 178 lb (80.7 kg)   SpO2 98%   BMI 25.54 kg/m    Wt Readings from Last 3 Encounters:  10/12/22 178 lb (80.7 kg)  09/29/22 183 lb (83 kg)  06/19/22 183 lb (83 kg)    GEN: Well nourished, well developed elderly male in no acute distress.  NECK: No JVD; No carotid bruits CARDIAC: S1S2 RRR, no murmurs, rubs, gallops, no carotid bruits. Pacemaker site well healed without erythema, edema or tethering RESPIRATORY:  Clear to auscultation without rales, wheezing or rhonchi  ABDOMEN: Soft, non-tender, non-distended EXTREMITIES:  No edema; No deformity   ASSESSMENT AND PLAN: .    CHB with PPM  -stable, no changes to device in clinic  -EKG stable, Vpaced 66  Permanent Atrial Fibrillation  On Coumadin, CHA2DS2VASC at least 4  -continue coumadin per primary   Secondary Hypercoagulable State -coumadin as above, followed by primary, Dr. Lawerance Bach   HTN -stable on hydrochlorothiazide, losartan  HLD  -per primary        Dispo: Follow up in 1 year with Dr. Elberta Fortis   Signed, Canary Brim, MSN, APRN, NP-C, AGACNP-BC [facility] - Electrophysiology  10/12/2022, 1:04 PM

## 2022-10-12 ENCOUNTER — Ambulatory Visit: Payer: PPO | Attending: Physician Assistant | Admitting: Pulmonary Disease

## 2022-10-12 ENCOUNTER — Encounter: Payer: Self-pay | Admitting: Pulmonary Disease

## 2022-10-12 VITALS — BP 128/70 | HR 66 | Ht 70.0 in | Wt 178.0 lb

## 2022-10-12 DIAGNOSIS — I4821 Permanent atrial fibrillation: Secondary | ICD-10-CM

## 2022-10-12 DIAGNOSIS — Z950 Presence of cardiac pacemaker: Secondary | ICD-10-CM

## 2022-10-12 DIAGNOSIS — I10 Essential (primary) hypertension: Secondary | ICD-10-CM | POA: Diagnosis not present

## 2022-10-12 DIAGNOSIS — I442 Atrioventricular block, complete: Secondary | ICD-10-CM

## 2022-10-12 LAB — CUP PACEART INCLINIC DEVICE CHECK
Battery Remaining Longevity: 100 mo
Battery Voltage: 3.01 V
Brady Statistic RA Percent Paced: 0 %
Brady Statistic RV Percent Paced: 87 %
Date Time Interrogation Session: 20240617140410
Implantable Lead Connection Status: 753985
Implantable Lead Connection Status: 753985
Implantable Lead Implant Date: 20100216
Implantable Lead Implant Date: 20100216
Implantable Lead Location: 753859
Implantable Lead Location: 753860
Implantable Pulse Generator Implant Date: 20211014
Lead Channel Impedance Value: 475 Ohm
Lead Channel Pacing Threshold Amplitude: 1 V
Lead Channel Pacing Threshold Amplitude: 1 V
Lead Channel Pacing Threshold Pulse Width: 0.5 ms
Lead Channel Pacing Threshold Pulse Width: 0.5 ms
Lead Channel Sensing Intrinsic Amplitude: 5.6 mV
Lead Channel Setting Pacing Amplitude: 1.125
Lead Channel Setting Pacing Pulse Width: 0.5 ms
Lead Channel Setting Sensing Sensitivity: 2 mV
Pulse Gen Model: 2272
Pulse Gen Serial Number: 3873301

## 2022-10-12 NOTE — Patient Instructions (Signed)
Medication Instructions:   Your physician recommends that you continue on your current medications as directed. Please refer to the Current Medication list given to you today.  *If you need a refill on your cardiac medications before your next appointment, please call your pharmacy*   Lab Work:  NONE ORDERED  TODAY   If you have labs (blood work) drawn today and your tests are completely normal, you will receive your results only by: MyChart Message (if you have MyChart) OR A paper copy in the mail If you have any lab test that is abnormal or we need to change your treatment, we will call you to review the results.   Testing/Procedures: NONE ORDERED  TODAY    Follow-Up: At [facility], you and your health needs are our priority.  As part of our continuing mission to provide you with exceptional heart care, we have created designated Provider Care Teams.  These Care Teams include your primary Cardiologist (physician) and Advanced Practice Providers (APPs -  Physician Assistants and Nurse Practitioners) who all work together to provide you with the care you need, when you need it.  We recommend signing up for the patient portal called "MyChart".  Sign up information is provided on this After Visit Summary.  MyChart is used to connect with patients for Virtual Visits (Telemedicine).  Patients are able to view lab/test results, encounter notes, upcoming appointments, etc.  Non-urgent messages can be sent to your provider as well.   To learn more about what you can do with MyChart, go to [email or web address].    Your next appointment:   1 year(s)  Provider:   Will Camnitz, MD    Other Instructions  

## 2022-11-10 DIAGNOSIS — D485 Neoplasm of uncertain behavior of skin: Secondary | ICD-10-CM | POA: Diagnosis not present

## 2022-11-10 DIAGNOSIS — C44329 Squamous cell carcinoma of skin of other parts of face: Secondary | ICD-10-CM | POA: Diagnosis not present

## 2022-11-10 DIAGNOSIS — C4442 Squamous cell carcinoma of skin of scalp and neck: Secondary | ICD-10-CM | POA: Diagnosis not present

## 2022-11-17 ENCOUNTER — Ambulatory Visit (INDEPENDENT_AMBULATORY_CARE_PROVIDER_SITE_OTHER): Payer: PPO

## 2022-11-17 ENCOUNTER — Ambulatory Visit: Payer: PPO

## 2022-11-17 DIAGNOSIS — Z7901 Long term (current) use of anticoagulants: Secondary | ICD-10-CM | POA: Diagnosis not present

## 2022-11-17 DIAGNOSIS — I442 Atrioventricular block, complete: Secondary | ICD-10-CM | POA: Diagnosis not present

## 2022-11-17 LAB — CUP PACEART REMOTE DEVICE CHECK
Battery Remaining Longevity: 103 mo
Battery Remaining Percentage: 80 %
Battery Voltage: 3.01 V
Brady Statistic RV Percent Paced: 85 %
Date Time Interrogation Session: 20240723020554
Implantable Lead Connection Status: 753985
Implantable Lead Connection Status: 753985
Implantable Lead Implant Date: 20100216
Implantable Lead Implant Date: 20100216
Implantable Lead Location: 753859
Implantable Lead Location: 753860
Implantable Pulse Generator Implant Date: 20211014
Lead Channel Impedance Value: 430 Ohm
Lead Channel Pacing Threshold Amplitude: 1.125 V
Lead Channel Pacing Threshold Pulse Width: 0.5 ms
Lead Channel Sensing Intrinsic Amplitude: 5.7 mV
Lead Channel Setting Pacing Amplitude: 1.375
Lead Channel Setting Pacing Pulse Width: 0.5 ms
Lead Channel Setting Sensing Sensitivity: 2 mV
Pulse Gen Model: 2272
Pulse Gen Serial Number: 3873301

## 2022-11-17 LAB — POCT INR: INR: 1.8 — AB (ref 2.0–3.0)

## 2022-11-17 NOTE — Patient Instructions (Addendum)
Pre visit review using our clinic review tool, if applicable. No additional management support is needed unless otherwise documented below in the visit note.  Increase dose today to take 1 tablet and the continue 1/2 tablet daily except take 1 tablet on Fridays. Recheck in 3 weeks.

## 2022-11-17 NOTE — Progress Notes (Signed)
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 Pt is doing well after the surgeries.  Increase dose today to take 1 tablet and the continue 1/2 tablet daily except take 1 tablet on Fridays. Recheck in 3 weeks.

## 2022-11-19 ENCOUNTER — Telehealth: Payer: Self-pay | Admitting: Internal Medicine

## 2022-11-19 ENCOUNTER — Other Ambulatory Visit: Payer: Self-pay | Admitting: Internal Medicine

## 2022-11-19 ENCOUNTER — Other Ambulatory Visit: Payer: Self-pay

## 2022-11-19 MED ORDER — HYDROCHLOROTHIAZIDE 12.5 MG PO CAPS: 12.5000 mg | ORAL_CAPSULE | Freq: Every day | ORAL | 1 refills | Status: DC

## 2022-11-19 MED ORDER — PRAVASTATIN SODIUM 20 MG PO TABS: 20.0000 mg | ORAL_TABLET | Freq: Every day | ORAL | 1 refills | Status: DC

## 2022-11-19 NOTE — Telephone Encounter (Signed)
Sent refill to [facility] 

## 2022-11-19 NOTE — Telephone Encounter (Signed)
Prescription Request  11/19/2022  LOV: 06/19/2022  What is the name of the medication or equipment? Hydrochlorothiazide   Have you contacted your pharmacy to request a refill? Yes   Which pharmacy would you like this sent to?  [facility] [place], [place] - [address] [place] [address] Phone: [phone] Fax: [phone]    Patient notified that their request is being sent to the clinical staff for review and that they should receive a response within 2 business days.   Please advise at Mobile [phone] (mobile)

## 2022-11-19 NOTE — Telephone Encounter (Signed)
Next OV is 12/18/2022.   Prescription Request  11/19/2022  LOV: 06/19/2022  What is the name of the medication or equipment? pravastatin (PRAVACHOL) 20 MG tablet   Have you contacted your pharmacy to request a refill? Yes   Which pharmacy would you like this sent to?  [facility] [place], [place] - [address] [place] [address] Phone: [phone] Fax: [phone]    Patient notified that their request is being sent to the clinical staff for review and that they should receive a response within 2 business days.   Please advise at [facility] [phone]

## 2022-11-19 NOTE — Telephone Encounter (Signed)
Sent in today for patient. 

## 2022-11-25 ENCOUNTER — Encounter (INDEPENDENT_AMBULATORY_CARE_PROVIDER_SITE_OTHER): Payer: Self-pay

## 2022-11-25 DIAGNOSIS — C44329 Squamous cell carcinoma of skin of other parts of face: Secondary | ICD-10-CM | POA: Diagnosis not present

## 2022-12-01 NOTE — Progress Notes (Signed)
Remote pacemaker transmission.   

## 2022-12-08 ENCOUNTER — Ambulatory Visit: Payer: PPO

## 2022-12-08 ENCOUNTER — Ambulatory Visit (INDEPENDENT_AMBULATORY_CARE_PROVIDER_SITE_OTHER): Payer: PPO

## 2022-12-08 DIAGNOSIS — C4442 Squamous cell carcinoma of skin of scalp and neck: Secondary | ICD-10-CM | POA: Diagnosis not present

## 2022-12-08 DIAGNOSIS — Z7901 Long term (current) use of anticoagulants: Secondary | ICD-10-CM | POA: Diagnosis not present

## 2022-12-08 LAB — POCT INR: INR: 2.3 (ref 2.0–3.0)

## 2022-12-08 NOTE — Patient Instructions (Addendum)
Pre visit review using our clinic review tool, if applicable. No additional management support is needed unless otherwise documented below in the visit note.  Continue 1/2 tablet daily except take 1 tablet on Fridays.  Recheck in 6 weeks. 

## 2022-12-08 NOTE — Progress Notes (Signed)
Continue 1/2 tablet daily except take 1 tablet on Fridays. Recheck in 6 weeks.

## 2022-12-10 ENCOUNTER — Encounter (INDEPENDENT_AMBULATORY_CARE_PROVIDER_SITE_OTHER): Payer: Self-pay

## 2022-12-17 NOTE — Progress Notes (Signed)
Subjective:    Patient ID: Albert Hawkins, male    DOB: Jan 24, 1941, 82 y.o.   MRN: [id number]     HPI [name] is here for follow up of his chronic medical problems.  Feels good.    Has had skin cancer removed.  Has another place to be removed.   Exercising very little.  Could do better with his diet.    Medications and allergies reviewed with patient and updated if appropriate.  Current Outpatient Medications on File Prior to Visit  Medication Sig Dispense Refill   acetaminophen (TYLENOL) 650 MG CR tablet Take 650 mg by mouth at bedtime. arthritis     Ascorbic Acid (VITAMIN C PO) Take 1 tablet by mouth daily.     cetirizine (ZYRTEC) 10 MG tablet Take 10 mg by mouth at bedtime.     Cholecalciferol (VITAMIN D) 50 MCG (2000 UT) tablet Take 4,000 Units by mouth daily.     fluorouracil (EFUDEX) 5 % cream 1 application to affected areas on face     FLUoxetine (PROZAC) 10 MG capsule Take 1 capsule (10 mg total) by mouth daily. 90 capsule 1   glimepiride (AMARYL) 4 MG tablet TAKE 1 TABLET EVERY DAY WITH BREAKFAST. 90 tablet 1   hydrochlorothiazide (MICROZIDE) 12.5 MG capsule Take 1 capsule (12.5 mg total) by mouth daily. 90 capsule 1   losartan (COZAAR) 100 MG tablet Take 1 tablet (100 mg total) by mouth daily. 90 tablet 1   Melatonin 10 MG CAPS Take by mouth.     metFORMIN (GLUCOPHAGE) 500 MG tablet TAKE TWO TABLETS BY MOUTH TWICE DAILY AFTER MEALS 360 tablet 1   Multiple Vitamin (MULTIVITAMIN) tablet Take 1 tablet by mouth daily.     Naphazoline-Pheniramine (OPCON-A OP) Place 1 drop into both eyes daily as needed (irritation/redness).     naproxen sodium (ALEVE) 220 MG tablet Take 220 mg by mouth daily as needed (shoulder pain).     ONETOUCH ULTRA test strip CHECK BLOOD SUGAR TWICE DAILY 100 strip 0   pravastatin (PRAVACHOL) 20 MG tablet Take 1 tablet (20 mg total) by mouth daily. 90 tablet 1   triamcinolone (NASACORT) 55 MCG/ACT AERO nasal inhaler Place 2 sprays into the nose  at bedtime as needed (congestion).     warfarin (COUMADIN) 2 MG tablet TAKE 1/2 TABLET BY MOUTH DAILY EXCEPT TAKE 1 TABLET ON FRIDAYS OR AS DIRECTED BY ANTICOAGULATION CLINIC 75 tablet 1   No current facility-administered medications on file prior to visit.     Review of Systems  Constitutional:  Negative for fever.  Respiratory:  Negative for cough, shortness of breath and wheezing.   Cardiovascular:  Negative for chest pain, palpitations and leg swelling.  Neurological:  Negative for light-headedness and headaches.  Psychiatric/Behavioral:  Positive for sleep disturbance (takes 2 hrs to get asleep).        Objective:   Vitals:   12/18/22 1259  BP: 124/78  Pulse: 99  Temp: 98 F (36.7 C)  SpO2: 97%   BP Readings from Last 3 Encounters:  12/18/22 124/78  10/12/22 128/70  06/19/22 130/78   Wt Readings from Last 3 Encounters:  12/18/22 182 lb (82.6 kg)  10/12/22 178 lb (80.7 kg)  09/29/22 183 lb (83 kg)   Body mass index is 26.11 kg/m.    Physical Exam Constitutional:      General: He is not in acute distress.    Appearance: Normal appearance. He is not ill-appearing.  HENT:  Head: Normocephalic and atraumatic.  Eyes:     Conjunctiva/sclera: Conjunctivae normal.  Cardiovascular:     Rate and Rhythm: Normal rate and regular rhythm.     Heart sounds: Murmur (2/6 systolic) heard.  Pulmonary:     Effort: Pulmonary effort is normal. No respiratory distress.     Breath sounds: Normal breath sounds. No wheezing or rales.  Musculoskeletal:     Right lower leg: No edema.     Left lower leg: No edema.  Skin:    General: Skin is warm and dry.     Findings: No rash.  Neurological:     Mental Status: He is alert. Mental status is at baseline.  Psychiatric:        Mood and Affect: Mood normal.        Lab Results  Component Value Date   WBC 6.8 06/19/2022   HGB 14.1 06/19/2022   HCT 43.2 06/19/2022   PLT 234.0 06/19/2022   GLUCOSE 145 (H) 06/19/2022   CHOL  151 06/19/2022   TRIG 162.0 (H) 06/19/2022   HDL 45.90 06/19/2022   LDLDIRECT 77.0 12/15/2021   LDLCALC 73 06/19/2022   ALT 18 06/19/2022   AST 24 06/19/2022   NA 135 06/19/2022   K 4.5 06/19/2022   CL 97 06/19/2022   CREATININE 1.23 06/19/2022   BUN 18 06/19/2022   CO2 28 06/19/2022   TSH 1.57 12/04/2020   PSA 2.36 03/17/2007   INR 2.3 12/08/2022   HGBA1C 8.6 (H) 06/19/2022   MICROALBUR 8.0 (H) 12/15/2021     Assessment & Plan:    See Problem List for Assessment and Plan of chronic medical problems.

## 2022-12-17 NOTE — Patient Instructions (Addendum)
      Blood work was ordered.   The lab is on the first floor.    Medications changes include :   none     Return in about 6 months (around 06/20/2023) for Physical Exam.

## 2022-12-18 ENCOUNTER — Ambulatory Visit: Payer: PPO | Admitting: Internal Medicine

## 2022-12-18 ENCOUNTER — Encounter: Payer: Self-pay | Admitting: Internal Medicine

## 2022-12-18 ENCOUNTER — Telehealth: Payer: Self-pay

## 2022-12-18 VITALS — BP 124/78 | HR 99 | Temp 98.0°F | Ht 70.0 in | Wt 182.0 lb

## 2022-12-18 DIAGNOSIS — I4891 Unspecified atrial fibrillation: Secondary | ICD-10-CM | POA: Diagnosis not present

## 2022-12-18 DIAGNOSIS — F3289 Other specified depressive episodes: Secondary | ICD-10-CM

## 2022-12-18 DIAGNOSIS — E1159 Type 2 diabetes mellitus with other circulatory complications: Secondary | ICD-10-CM | POA: Diagnosis not present

## 2022-12-18 DIAGNOSIS — I10 Essential (primary) hypertension: Secondary | ICD-10-CM

## 2022-12-18 DIAGNOSIS — N1831 Chronic kidney disease, stage 3a: Secondary | ICD-10-CM

## 2022-12-18 DIAGNOSIS — E7849 Other hyperlipidemia: Secondary | ICD-10-CM | POA: Diagnosis not present

## 2022-12-18 LAB — COMPREHENSIVE METABOLIC PANEL
ALT: 23 U/L (ref 0–53)
AST: 29 U/L (ref 0–37)
Albumin: 4.1 g/dL (ref 3.5–5.2)
Alkaline Phosphatase: 46 U/L (ref 39–117)
BUN: 24 mg/dL — ABNORMAL HIGH (ref 6–23)
CO2: 28 mEq/L (ref 19–32)
Calcium: 9.6 mg/dL (ref 8.4–10.5)
Chloride: 97 mEq/L (ref 96–112)
Creatinine, Ser: 1.39 mg/dL (ref 0.40–1.50)
GFR: 48.11 mL/min — ABNORMAL LOW (ref >60.00)
Glucose, Bld: 238 mg/dL — ABNORMAL HIGH (ref 70–99)
Potassium: 4.6 mEq/L (ref 3.5–5.1)
Sodium: 134 mEq/L — ABNORMAL LOW (ref 135–145)
Total Bilirubin: 0.8 mg/dL (ref 0.2–1.2)
Total Protein: 6.8 g/dL (ref 6.0–8.3)

## 2022-12-18 LAB — MICROALBUMIN / CREATININE URINE RATIO
Creatinine,U: 52.8 mg/dL
Microalb Creat Ratio: 1.6 mg/g (ref 0.0–30.0)
Microalb, Ur: 0.8 mg/dL (ref 0.0–1.9)

## 2022-12-18 LAB — LDL CHOLESTEROL, DIRECT: Direct LDL: 104 mg/dL

## 2022-12-18 LAB — CBC WITH DIFFERENTIAL/PLATELET
Basophils Absolute: 0 10*3/uL (ref 0.0–0.1)
Basophils Relative: 0.6 % (ref 0.0–3.0)
Eosinophils Absolute: 0.2 10*3/uL (ref 0.0–0.7)
Eosinophils Relative: 3.3 % (ref 0.0–5.0)
HCT: 42.8 % (ref 39.0–52.0)
Hemoglobin: 13.9 g/dL (ref 13.0–17.0)
Lymphocytes Relative: 27.8 % (ref 12.0–46.0)
Lymphs Abs: 2 10*3/uL (ref 0.7–4.0)
MCHC: 32.5 g/dL (ref 30.0–36.0)
MCV: 89 fl (ref 78.0–100.0)
Monocytes Absolute: 0.6 10*3/uL (ref 0.1–1.0)
Monocytes Relative: 8.7 % (ref 3.0–12.0)
Neutro Abs: 4.4 10*3/uL (ref 1.4–7.7)
Neutrophils Relative %: 59.6 % (ref 43.0–77.0)
Platelets: 204 10*3/uL (ref 150.0–400.0)
RBC: 4.81 Mil/uL (ref 4.22–5.81)
RDW: 13.6 % (ref 11.5–15.5)
WBC: 7.3 10*3/uL (ref 4.0–10.5)

## 2022-12-18 LAB — LIPID PANEL
Cholesterol: 167 mg/dL (ref 0–200)
HDL: 45.1 mg/dL (ref >39.00)
NonHDL: 121.91
Total CHOL/HDL Ratio: 4
Triglycerides: 219 mg/dL — ABNORMAL HIGH (ref 0.0–149.0)
VLDL: 43.8 mg/dL — ABNORMAL HIGH (ref 0.0–40.0)

## 2022-12-18 LAB — HEMOGLOBIN A1C: Hgb A1c MFr Bld: 7.6 % — ABNORMAL HIGH (ref 4.6–6.5)

## 2022-12-18 LAB — VITAMIN D 25 HYDROXY (VIT D DEFICIENCY, FRACTURES): VITD: 108.13 ng/mL (ref 30.00–100.00)

## 2022-12-18 NOTE — Assessment & Plan Note (Signed)
Chronic   Lab Results  Component Value Date   HGBA1C 8.6 (H) 06/19/2022   Sugars not ideally controlled Check A1c Continue glimepiride 4 mg daily, metformin 1000 mg twice daily Stressed regular exercise, diabetic diet

## 2022-12-18 NOTE — Assessment & Plan Note (Signed)
Chronic Following with cardiology On warfarin-managed by our Coumadin nurse Asymptomatic CBC, CMP

## 2022-12-18 NOTE — Telephone Encounter (Signed)
CRITICAL VALUE STICKER  CRITICAL VALUE: Vit D 108.13  RECEIVER (on-site recipient of call): Byrd Hesselbach  DATE & TIME NOTIFIED: 12/18/2022 @ 4:45 pm  MESSENGER (representative from lab): Hope  MD NOTIFIED: Yes  TIME OF NOTIFICATION: 4:46 pm  RESPONSE:  Will inform pt.

## 2022-12-18 NOTE — Telephone Encounter (Signed)
Noted  

## 2022-12-18 NOTE — Assessment & Plan Note (Signed)
Chronic Controlled, Stable Continue fluoxetine 10 mg daily 

## 2022-12-18 NOTE — Assessment & Plan Note (Addendum)
Chronic Stable Taking 1 Aleve a day- had recent skin cancer removed and has had increased pain -- does not take it daily usually Stressed good water intake Stressed good blood pressure and sugar control CMP

## 2022-12-18 NOTE — Assessment & Plan Note (Signed)
Chronic ?Regular exercise and healthy diet encouraged ?Check lipid panel  ?Continue pravastatin 20 mg daily ?

## 2022-12-18 NOTE — Assessment & Plan Note (Signed)
Chronic Blood pressure well controlled CMP Continue losartan 100 mg daily, HCTZ 12.5 mg daily

## 2022-12-23 DIAGNOSIS — C44319 Basal cell carcinoma of skin of other parts of face: Secondary | ICD-10-CM | POA: Diagnosis not present

## 2023-01-03 ENCOUNTER — Other Ambulatory Visit: Payer: Self-pay | Admitting: Internal Medicine

## 2023-01-18 ENCOUNTER — Telehealth: Payer: Self-pay | Admitting: Internal Medicine

## 2023-01-18 NOTE — Telephone Encounter (Signed)
Patient called to speak with Albert Hawkins from the coumadin clinic. He said it was about his appointment tomorrow and declined to give further information. Patient would like a call back at [phone].

## 2023-01-18 NOTE — Telephone Encounter (Signed)
[facility] coumadin clinic apt for 9/27. Pt requested flu vaccine and also apt for his wife for flu vaccine. Pt's wife, Darel Hong, gave verbal authorization to enter her chart and schedule her for a flu vaccine. They prefer the coumadin nurse give them both the vaccine.   Scheduled pt's wife for vaccine.

## 2023-01-19 ENCOUNTER — Ambulatory Visit: Payer: PPO

## 2023-01-22 ENCOUNTER — Ambulatory Visit (INDEPENDENT_AMBULATORY_CARE_PROVIDER_SITE_OTHER): Payer: PPO

## 2023-01-22 DIAGNOSIS — Z7901 Long term (current) use of anticoagulants: Secondary | ICD-10-CM

## 2023-01-22 DIAGNOSIS — Z23 Encounter for immunization: Secondary | ICD-10-CM | POA: Diagnosis not present

## 2023-01-22 LAB — POCT INR: INR: 1.9 — AB (ref 2.0–3.0)

## 2023-01-22 NOTE — Progress Notes (Signed)
Increase dose today to take  1 1/2 tablets and the continue 1/2 tablet daily except take 1 tablet on Fridays. Recheck in 3 weeks.   Pt requested high dose flu vaccine. Administered flu vaccine in L deltoid. Pt tolerated well.

## 2023-01-22 NOTE — Patient Instructions (Addendum)
Pre visit review using our clinic review tool, if applicable. No additional management support is needed unless otherwise documented below in the visit note.  Increase dose today to take  1 1/2 tablets and the continue 1/2 tablet daily except take 1 tablet on Fridays. Recheck in 3 weeks.

## 2023-02-09 ENCOUNTER — Ambulatory Visit (INDEPENDENT_AMBULATORY_CARE_PROVIDER_SITE_OTHER): Payer: PPO

## 2023-02-09 DIAGNOSIS — Z7901 Long term (current) use of anticoagulants: Secondary | ICD-10-CM | POA: Diagnosis not present

## 2023-02-09 LAB — POCT INR: INR: 2.5 (ref 2.0–3.0)

## 2023-02-09 NOTE — Patient Instructions (Addendum)
Pre visit review using our clinic review tool, if applicable. No additional management support is needed unless otherwise documented below in the visit note.  Continue 1/2 tablet daily except take 1 tablet on Fridays.  Recheck in 6 weeks.

## 2023-02-09 NOTE — Progress Notes (Signed)
Continue 1/2 tablet daily except take 1 tablet on Fridays. Recheck in 6 weeks.

## 2023-02-12 ENCOUNTER — Other Ambulatory Visit: Payer: Self-pay | Admitting: Internal Medicine

## 2023-02-13 ENCOUNTER — Other Ambulatory Visit: Payer: Self-pay | Admitting: Internal Medicine

## 2023-02-16 ENCOUNTER — Ambulatory Visit: Payer: PPO

## 2023-02-16 DIAGNOSIS — I442 Atrioventricular block, complete: Secondary | ICD-10-CM | POA: Diagnosis not present

## 2023-02-17 LAB — CUP PACEART REMOTE DEVICE CHECK
Battery Remaining Longevity: 101 mo
Battery Remaining Percentage: 78 %
Battery Voltage: 3.01 V
Brady Statistic RV Percent Paced: 86 %
Date Time Interrogation Session: 20241022024737
Implantable Lead Connection Status: 753985
Implantable Lead Connection Status: 753985
Implantable Lead Implant Date: 20100216
Implantable Lead Implant Date: 20100216
Implantable Lead Location: 753859
Implantable Lead Location: 753860
Implantable Pulse Generator Implant Date: 20211014
Lead Channel Impedance Value: 430 Ohm
Lead Channel Pacing Threshold Amplitude: 1 V
Lead Channel Pacing Threshold Pulse Width: 0.5 ms
Lead Channel Sensing Intrinsic Amplitude: 5.2 mV
Lead Channel Setting Pacing Amplitude: 1.25 V
Lead Channel Setting Pacing Pulse Width: 0.5 ms
Lead Channel Setting Sensing Sensitivity: 2 mV
Pulse Gen Model: 2272
Pulse Gen Serial Number: 3873301

## 2023-03-04 NOTE — Progress Notes (Signed)
Remote pacemaker transmission.   

## 2023-03-23 ENCOUNTER — Ambulatory Visit (INDEPENDENT_AMBULATORY_CARE_PROVIDER_SITE_OTHER): Payer: PPO

## 2023-03-23 DIAGNOSIS — Z7901 Long term (current) use of anticoagulants: Secondary | ICD-10-CM

## 2023-03-23 LAB — POCT INR: INR: 2.6 (ref 2.0–3.0)

## 2023-03-23 NOTE — Patient Instructions (Addendum)
Pre visit review using our clinic review tool, if applicable. No additional management support is needed unless otherwise documented below in the visit note.  Continue 1/2 tablet daily except take 1 tablet on Fridays.  Recheck in 6 weeks.

## 2023-03-23 NOTE — Progress Notes (Signed)
Continue 1/2 tablet daily except take 1 tablet on Fridays. Recheck in 6 weeks.

## 2023-05-04 ENCOUNTER — Ambulatory Visit (INDEPENDENT_AMBULATORY_CARE_PROVIDER_SITE_OTHER): Payer: PPO

## 2023-05-04 DIAGNOSIS — Z7901 Long term (current) use of anticoagulants: Secondary | ICD-10-CM

## 2023-05-04 LAB — POCT INR: INR: 1.6 — AB (ref 2.0–3.0)

## 2023-05-04 NOTE — Patient Instructions (Addendum)
 Pre visit review using our clinic review tool, if applicable. No additional management support is needed unless otherwise documented below in the visit note.  Increase dose today to take 1 tablet and then continue 1/2 tablet daily except take 1 tablet on Fridays. Recheck in 3 weeks.

## 2023-05-04 NOTE — Progress Notes (Signed)
 Pt taking Relaxium gummies to help sleep. Unsure of exact ingredients of these gummies. Increase dose today to take 1 tablet and then continue 1/2 tablet daily except take 1 tablet on Fridays. Recheck in 3 weeks.

## 2023-05-13 ENCOUNTER — Other Ambulatory Visit: Payer: Self-pay | Admitting: Internal Medicine

## 2023-05-13 DIAGNOSIS — Z7901 Long term (current) use of anticoagulants: Secondary | ICD-10-CM

## 2023-05-18 ENCOUNTER — Ambulatory Visit (INDEPENDENT_AMBULATORY_CARE_PROVIDER_SITE_OTHER): Payer: PPO

## 2023-05-18 DIAGNOSIS — I442 Atrioventricular block, complete: Secondary | ICD-10-CM | POA: Diagnosis not present

## 2023-05-18 LAB — CUP PACEART REMOTE DEVICE CHECK
Battery Remaining Longevity: 98 mo
Battery Remaining Percentage: 76 %
Battery Voltage: 3.01 V
Brady Statistic RV Percent Paced: 82 %
Date Time Interrogation Session: 20250121022933
Implantable Lead Connection Status: 753985
Implantable Lead Connection Status: 753985
Implantable Lead Implant Date: 20100216
Implantable Lead Implant Date: 20100216
Implantable Lead Location: 753859
Implantable Lead Location: 753860
Implantable Pulse Generator Implant Date: 20211014
Lead Channel Impedance Value: 430 Ohm
Lead Channel Pacing Threshold Amplitude: 1 V
Lead Channel Pacing Threshold Pulse Width: 0.5 ms
Lead Channel Sensing Intrinsic Amplitude: 5.4 mV
Lead Channel Setting Pacing Amplitude: 1.25 V
Lead Channel Setting Pacing Pulse Width: 0.5 ms
Lead Channel Setting Sensing Sensitivity: 2 mV
Pulse Gen Model: 2272
Pulse Gen Serial Number: 3873301

## 2023-05-25 ENCOUNTER — Ambulatory Visit (INDEPENDENT_AMBULATORY_CARE_PROVIDER_SITE_OTHER): Payer: PPO

## 2023-05-25 DIAGNOSIS — Z7901 Long term (current) use of anticoagulants: Secondary | ICD-10-CM | POA: Diagnosis not present

## 2023-05-25 LAB — POCT INR: INR: 3.2 — AB (ref 2.0–3.0)

## 2023-05-25 NOTE — Patient Instructions (Addendum)
Pre visit review using our clinic review tool, if applicable. No additional management support is needed unless otherwise documented below in the visit note.  Hold dose today and then continue 1/2 tablet daily except take 1 tablet on Fridays. Recheck in 4 weeks.

## 2023-05-25 NOTE — Progress Notes (Signed)
Pt taking Relaxium gummies to help sleep. Unsure of exact ingredients of these gummies. Hold dose today and then continue 1/2 tablet daily except take 1 tablet on Fridays. Recheck in 4 weeks.

## 2023-05-27 ENCOUNTER — Telehealth: Payer: Self-pay

## 2023-05-27 NOTE — Telephone Encounter (Signed)
Copied from CRM [id number]. Topic: General - Other >> May 26, 2023  4:08 PM Irine Seal wrote: Reason for CRM:  Albert Hawkins with [employer] checking on the status of a chronic condition verification form that was faxed over multiple times this month, confirmed fax #  was correct please call Albert Hawkins back at  [phone], she states she has resent it a few times and have not heard back

## 2023-05-27 NOTE — Telephone Encounter (Signed)
Copied from CRM [id number]. Topic: General - Other >> May 26, 2023  4:08 PM Irine Seal wrote: Reason for CRM:  Albert Hawkins with [employer] checking on the status of a chronic condition verification form that was faxed over multiple times this month, confirmed fax #  was correct please call glenna back at  [phone], she states she has resent it a few times and have not heard back

## 2023-05-27 NOTE — Telephone Encounter (Signed)
Spoke with Corrie Dandy today and info given over the phone.

## 2023-06-22 ENCOUNTER — Ambulatory Visit (INDEPENDENT_AMBULATORY_CARE_PROVIDER_SITE_OTHER): Payer: PPO

## 2023-06-22 DIAGNOSIS — Z7901 Long term (current) use of anticoagulants: Secondary | ICD-10-CM

## 2023-06-22 LAB — POCT INR: INR: 2.3 (ref 2.0–3.0)

## 2023-06-22 NOTE — Progress Notes (Signed)
 Pt taking Relaxium gummies to help sleep. Unsure of exact ingredients of these gummies. Continue 1/2 tablet daily except take 1 tablet on Fridays. Recheck in 6 weeks.

## 2023-06-22 NOTE — Patient Instructions (Addendum)
 Pre visit review using our clinic review tool, if applicable. No additional management support is needed unless otherwise documented below in the visit note.  Continue 1/2 tablet daily except take 1 tablet on Fridays.  Recheck in 6 weeks.

## 2023-06-23 ENCOUNTER — Ambulatory Visit: Payer: PPO | Admitting: Internal Medicine

## 2023-06-28 ENCOUNTER — Encounter: Payer: Self-pay | Admitting: Internal Medicine

## 2023-06-28 NOTE — Progress Notes (Signed)
 Remote pacemaker transmission.

## 2023-06-28 NOTE — Patient Instructions (Signed)
      Blood work was ordered.       Medications changes include :   None    A referral was ordered and someone will call you to schedule an appointment.     No follow-ups on file.

## 2023-06-28 NOTE — Progress Notes (Unsigned)
 Subjective:    Patient ID: Albert Hawkins, male    DOB: 1941/01/03, 83 y.o.   MRN: [id number]     HPI Albert Hawkins is here for follow up of his chronic medical problems.  For DM - metformin 1000 mg bid, glimepiride 4 mg daily  Medications and allergies reviewed with patient and updated if appropriate.  Current Outpatient Medications on File Prior to Visit  Medication Sig Dispense Refill   acetaminophen (TYLENOL) 650 MG CR tablet Take 650 mg by mouth at bedtime. arthritis     Ascorbic Acid (VITAMIN C PO) Take 1 tablet by mouth daily.     cetirizine (ZYRTEC) 10 MG tablet Take 10 mg by mouth at bedtime.     Cholecalciferol (VITAMIN D) 50 MCG (2000 UT) tablet Take 4,000 Units by mouth daily.     fluorouracil (EFUDEX) 5 % cream 1 application to affected areas on face     FLUoxetine (PROZAC) 10 MG capsule Take 1 capsule (10 mg total) by mouth daily. 90 capsule 1   glimepiride (AMARYL) 4 MG tablet TAKE 1 TABLET EVERY DAY WITH BREAKFAST. 90 tablet 1   hydrochlorothiazide (MICROZIDE) 12.5 MG capsule Take 1 capsule (12.5 mg total) by mouth daily. 90 capsule 1   losartan (COZAAR) 100 MG tablet Take 1 tablet (100 mg total) by mouth daily. 90 tablet 1   Melatonin 10 MG CAPS Take by mouth.     metFORMIN (GLUCOPHAGE) 500 MG tablet TAKE TWO TABLETS BY MOUTH TWICE DAILY AFTER MEALS 360 tablet 1   Multiple Vitamin (MULTIVITAMIN) tablet Take 1 tablet by mouth daily.     Naphazoline-Pheniramine (OPCON-A OP) Place 1 drop into both eyes daily as needed (irritation/redness).     naproxen sodium (ALEVE) 220 MG tablet Take 220 mg by mouth daily as needed (shoulder pain).     ONETOUCH ULTRA test strip CHECK BLOOD SUGAR TWICE DAILY 100 strip 0   pravastatin (PRAVACHOL) 20 MG tablet TAKE ONE TABLET BY MOUTH DAILY 90 tablet 1   triamcinolone (NASACORT) 55 MCG/ACT AERO nasal inhaler Place 2 sprays into the nose at bedtime as needed (congestion).     warfarin (COUMADIN) 2 MG tablet TAKE 1/2 TABLET BY MOUTH  DAILY EXCEPT TAKE 1 TABLET ON FRIDAYS OR AS DIRECTED BY ANTICOAGULATION CLINIC 75 tablet 1   No current facility-administered medications on file prior to visit.     Review of Systems     Objective:  There were no vitals filed for this visit. BP Readings from Last 3 Encounters:  12/18/22 124/78  10/12/22 128/70  06/19/22 130/78   Wt Readings from Last 3 Encounters:  12/18/22 182 lb (82.6 kg)  10/12/22 178 lb (80.7 kg)  09/29/22 183 lb (83 kg)   There is no height or weight on file to calculate BMI.    Physical Exam     Lab Results  Component Value Date   WBC 7.3 12/18/2022   HGB 13.9 12/18/2022   HCT 42.8 12/18/2022   PLT 204.0 12/18/2022   GLUCOSE 238 (H) 12/18/2022   CHOL 167 12/18/2022   TRIG 219.0 (H) 12/18/2022   HDL 45.10 12/18/2022   LDLDIRECT 104.0 12/18/2022   LDLCALC 73 06/19/2022   ALT 23 12/18/2022   AST 29 12/18/2022   NA 134 (L) 12/18/2022   K 4.6 12/18/2022   CL 97 12/18/2022   CREATININE 1.39 12/18/2022   BUN 24 (H) 12/18/2022   CO2 28 12/18/2022   TSH 1.57 12/04/2020   PSA  2.36 03/17/2007   INR 2.3 06/22/2023   HGBA1C 7.6 (H) 12/18/2022   MICROALBUR 0.8 12/18/2022     Assessment & Plan:    See Problem List for Assessment and Plan of chronic medical problems.

## 2023-06-29 ENCOUNTER — Encounter: Payer: Self-pay | Admitting: Internal Medicine

## 2023-06-29 ENCOUNTER — Ambulatory Visit: Payer: PPO | Admitting: Internal Medicine

## 2023-06-29 ENCOUNTER — Ambulatory Visit (INDEPENDENT_AMBULATORY_CARE_PROVIDER_SITE_OTHER): Payer: PPO | Admitting: Internal Medicine

## 2023-06-29 VITALS — BP 122/74 | HR 70 | Temp 97.6°F | Ht 70.0 in | Wt 183.0 lb

## 2023-06-29 DIAGNOSIS — E7849 Other hyperlipidemia: Secondary | ICD-10-CM

## 2023-06-29 DIAGNOSIS — I4891 Unspecified atrial fibrillation: Secondary | ICD-10-CM | POA: Diagnosis not present

## 2023-06-29 DIAGNOSIS — N1831 Chronic kidney disease, stage 3a: Secondary | ICD-10-CM

## 2023-06-29 DIAGNOSIS — I10 Essential (primary) hypertension: Secondary | ICD-10-CM

## 2023-06-29 DIAGNOSIS — G4733 Obstructive sleep apnea (adult) (pediatric): Secondary | ICD-10-CM | POA: Diagnosis not present

## 2023-06-29 DIAGNOSIS — G479 Sleep disorder, unspecified: Secondary | ICD-10-CM | POA: Diagnosis not present

## 2023-06-29 DIAGNOSIS — E1159 Type 2 diabetes mellitus with other circulatory complications: Secondary | ICD-10-CM | POA: Diagnosis not present

## 2023-06-29 DIAGNOSIS — Z7984 Long term (current) use of oral hypoglycemic drugs: Secondary | ICD-10-CM | POA: Diagnosis not present

## 2023-06-29 DIAGNOSIS — F3289 Other specified depressive episodes: Secondary | ICD-10-CM

## 2023-06-29 LAB — COMPREHENSIVE METABOLIC PANEL
ALT: 23 U/L (ref 0–53)
AST: 26 U/L (ref 0–37)
Albumin: 4.1 g/dL (ref 3.5–5.2)
Alkaline Phosphatase: 49 U/L (ref 39–117)
BUN: 17 mg/dL (ref 6–23)
CO2: 28 meq/L (ref 19–32)
Calcium: 9.4 mg/dL (ref 8.4–10.5)
Chloride: 98 meq/L (ref 96–112)
Creatinine, Ser: 1.21 mg/dL (ref 0.40–1.50)
GFR: 56.61 mL/min — ABNORMAL LOW (ref >60.00)
Glucose, Bld: 238 mg/dL — ABNORMAL HIGH (ref 70–99)
Potassium: 4.4 meq/L (ref 3.5–5.1)
Sodium: 135 meq/L (ref 135–145)
Total Bilirubin: 0.7 mg/dL (ref 0.2–1.2)
Total Protein: 6.7 g/dL (ref 6.0–8.3)

## 2023-06-29 LAB — LIPID PANEL
Cholesterol: 147 mg/dL (ref 0–200)
HDL: 47.1 mg/dL (ref >39.00)
LDL Cholesterol: 56 mg/dL (ref 0–99)
NonHDL: 99.51
Total CHOL/HDL Ratio: 3
Triglycerides: 216 mg/dL — ABNORMAL HIGH (ref 0.0–149.0)
VLDL: 43.2 mg/dL — ABNORMAL HIGH (ref 0.0–40.0)

## 2023-06-29 LAB — TSH: TSH: 1.31 u[IU]/mL (ref 0.35–5.50)

## 2023-06-29 LAB — CBC WITH DIFFERENTIAL/PLATELET
Basophils Absolute: 0 10*3/uL (ref 0.0–0.1)
Basophils Relative: 0.5 % (ref 0.0–3.0)
Eosinophils Absolute: 0.2 10*3/uL (ref 0.0–0.7)
Eosinophils Relative: 2.6 % (ref 0.0–5.0)
HCT: 43.1 % (ref 39.0–52.0)
Hemoglobin: 14.2 g/dL (ref 13.0–17.0)
Lymphocytes Relative: 24.2 % (ref 12.0–46.0)
Lymphs Abs: 1.7 10*3/uL (ref 0.7–4.0)
MCHC: 33 g/dL (ref 30.0–36.0)
MCV: 89.8 fl (ref 78.0–100.0)
Monocytes Absolute: 0.7 10*3/uL (ref 0.1–1.0)
Monocytes Relative: 9.4 % (ref 3.0–12.0)
Neutro Abs: 4.5 10*3/uL (ref 1.4–7.7)
Neutrophils Relative %: 63.3 % (ref 43.0–77.0)
Platelets: 184 10*3/uL (ref 150.0–400.0)
RBC: 4.8 Mil/uL (ref 4.22–5.81)
RDW: 14 % (ref 11.5–15.5)
WBC: 7.1 10*3/uL (ref 4.0–10.5)

## 2023-06-29 LAB — HEMOGLOBIN A1C: Hgb A1c MFr Bld: 7.9 % — ABNORMAL HIGH (ref 4.6–6.5)

## 2023-06-29 NOTE — Assessment & Plan Note (Signed)
 Chronic Controlled, Stable Continue fluoxetine 10 mg daily

## 2023-06-29 NOTE — Assessment & Plan Note (Addendum)
 Chronic  Lab Results  Component Value Date   HGBA1C 7.6 (H) 12/18/2022   Sugars not ideally controlled Check A1c Continue glimepiride 4 mg daily, metformin 1000 mg twice daily May need to consider insulin or trulicity Rybelsus too expensive, did not tolerate Jardiance Stressed regular exercise, diabetic diet

## 2023-06-29 NOTE — Assessment & Plan Note (Signed)
Chronic Blood pressure well controlled CMP Continue losartan 100 mg daily, HCTZ 12.5 mg daily

## 2023-06-29 NOTE — Assessment & Plan Note (Addendum)
 Chronic Stable Stressed good water intake Avoid NSAIDs Stressed good blood pressure and sugar control CMP

## 2023-06-29 NOTE — Assessment & Plan Note (Addendum)
 Chronic Has not been using CPAP nightly Stressed the importance of compliance and the potential side effects of not using CPAP He knows he needs to get back to using it nightly

## 2023-06-29 NOTE — Assessment & Plan Note (Signed)
Chronic ?Regular exercise and healthy diet encouraged ?Check lipid panel  ?Continue pravastatin 20 mg daily ?

## 2023-06-29 NOTE — Assessment & Plan Note (Signed)
 Chronic Using relaxium which has been helping - not working as well now [employer] about trazodone - can consider in the future if needed Will continue relaxium for now

## 2023-06-29 NOTE — Assessment & Plan Note (Addendum)
 Chronic Following with cardiology On warfarin-managed by our Coumadin nurse Asymptomatic CBC, CMP, tsh

## 2023-07-01 ENCOUNTER — Encounter: Payer: Self-pay | Admitting: Internal Medicine

## 2023-08-03 ENCOUNTER — Ambulatory Visit (INDEPENDENT_AMBULATORY_CARE_PROVIDER_SITE_OTHER): Payer: PPO

## 2023-08-03 DIAGNOSIS — Z7901 Long term (current) use of anticoagulants: Secondary | ICD-10-CM | POA: Diagnosis not present

## 2023-08-03 LAB — POCT INR: INR: 3 (ref 2.0–3.0)

## 2023-08-03 NOTE — Patient Instructions (Addendum)
 Pre visit review using our clinic review tool, if applicable. No additional management support is needed unless otherwise documented below in the visit note.  Continue 1/2 tablet daily except take 1 tablet on Fridays.  Recheck in 6 weeks.

## 2023-08-03 NOTE — Progress Notes (Signed)
 Pt taking Relaxium gummies to help sleep. Unsure of exact ingredients of these gummies. Pt took Aleve last night to help with with pain so he could sleep. Advised this has a major interaction with warfarin and will increase bleeding risk. Pt denies any s/s of abnormal bruising or bleeding. Advised if any s/s to go to ER.  Continue 1/2 tablet daily except take 1 tablet on Fridays. Recheck in 6 weeks.

## 2023-08-10 ENCOUNTER — Other Ambulatory Visit: Payer: Self-pay | Admitting: Internal Medicine

## 2023-08-17 ENCOUNTER — Ambulatory Visit: Payer: PPO

## 2023-08-17 DIAGNOSIS — I442 Atrioventricular block, complete: Secondary | ICD-10-CM | POA: Diagnosis not present

## 2023-08-18 LAB — CUP PACEART REMOTE DEVICE CHECK
Battery Remaining Longevity: 97 mo
Battery Remaining Percentage: 74 %
Battery Voltage: 3.01 V
Brady Statistic RV Percent Paced: 82 %
Date Time Interrogation Session: 20250422020019
Implantable Lead Connection Status: 753985
Implantable Lead Connection Status: 753985
Implantable Lead Implant Date: 20100216
Implantable Lead Implant Date: 20100216
Implantable Lead Location: 753859
Implantable Lead Location: 753860
Implantable Pulse Generator Implant Date: 20211014
Lead Channel Impedance Value: 450 Ohm
Lead Channel Pacing Threshold Amplitude: 1 V
Lead Channel Pacing Threshold Pulse Width: 0.5 ms
Lead Channel Sensing Intrinsic Amplitude: 5.6 mV
Lead Channel Setting Pacing Amplitude: 1.25 V
Lead Channel Setting Pacing Pulse Width: 0.5 ms
Lead Channel Setting Sensing Sensitivity: 2 mV
Pulse Gen Model: 2272
Pulse Gen Serial Number: 3873301

## 2023-09-14 ENCOUNTER — Ambulatory Visit (INDEPENDENT_AMBULATORY_CARE_PROVIDER_SITE_OTHER)

## 2023-09-14 DIAGNOSIS — Z7901 Long term (current) use of anticoagulants: Secondary | ICD-10-CM | POA: Diagnosis not present

## 2023-09-14 LAB — POCT INR: INR: 3 (ref 2.0–3.0)

## 2023-09-14 NOTE — Progress Notes (Signed)
 Pt took an Aleve last night for back pain. He reports Tylenol  does not work for him. Advised Aleve interacts with warfarin and will increase INR and bleeding risk. Pt denies any s/s of bleeding or abnormal bruising. Advised if any s/s to go to ER.  Continue 1/2 tablet daily except take 1 tablet on Fridays. Recheck in 6 weeks.

## 2023-09-14 NOTE — Patient Instructions (Addendum)
 Pre visit review using our clinic review tool, if applicable. No additional management support is needed unless otherwise documented below in the visit note.  Continue 1/2 tablet daily except take 1 tablet on Fridays.  Recheck in 6 weeks.

## 2023-09-30 ENCOUNTER — Ambulatory Visit: Payer: PPO

## 2023-09-30 VITALS — Ht 71.0 in | Wt 183.0 lb

## 2023-09-30 DIAGNOSIS — Z0000 Encounter for general adult medical examination without abnormal findings: Secondary | ICD-10-CM

## 2023-09-30 NOTE — Addendum Note (Signed)
 Addended by: Lott Rouleau A on: 09/30/2023 01:14 PM   Modules accepted: Orders

## 2023-09-30 NOTE — Progress Notes (Signed)
 Subjective:   Albert Hawkins is a 83 y.o. who presents for a Medicare Wellness preventive visit.  As a reminder, Annual Wellness Visits don't include a physical exam, and some assessments may be limited, especially if this visit is performed virtually. We may recommend an in-person follow-up visit with your provider if needed.  Visit Complete: Virtual I connected with  Albert Hawkins on 09/30/23 by a audio enabled telemedicine application and verified that I am speaking with the correct person using two identifiers.  Patient Location: Home  Provider Location: Office/Clinic  I discussed the limitations of evaluation and management by telemedicine. The patient expressed understanding and agreed to proceed.  Vital Signs: Because this visit was a virtual/telehealth visit, some criteria may be missing or patient reported. Any vitals not documented were not able to be obtained and vitals that have been documented are patient reported.  VideoDeclined- This patient declined Librarian, academic. Therefore the visit was completed with audio only.  Persons Participating in Visit: Patient.  AWV Questionnaire: Yes: Patient Medicare AWV questionnaire was completed by the patient on 09/28/2023; I have confirmed that all information answered by patient is correct and no changes since this date.  Cardiac Risk Factors include: advanced age (>11men, >26 women);hypertension;male gender;diabetes mellitus;Other (see comment);dyslipidemia, Risk factor comments: OSA, Atrial fibrillation, CKD stage3     Objective:     Today's Vitals   09/30/23 1359  Weight: 183 lb (83 kg)  Height: 5\' 11"  (1.803 m)   Body mass index is 25.52 kg/m.     09/30/2023    2:05 PM 09/29/2022    2:30 PM 09/26/2021    2:37 PM 09/24/2020   11:22 AM 02/08/2020   12:06 PM 08/24/2019   11:48 AM  Advanced Directives  Does Patient Have a Medical Advance Directive? Yes No Yes Yes No Yes  Type of Sports coach of [place];Living will  Living will Living will;Healthcare Power of Attorney  Living will;Healthcare Power of Attorney  Does patient want to make changes to medical advance directive?   No - Patient declined No - Patient declined  No - Patient declined  Copy of Healthcare Power of Attorney in Chart? No - copy requested   No - copy requested  No - copy requested  Would patient like information on creating a medical advance directive?  Yes (MAU/Ambulatory/Procedural Areas - Information given)   No - Patient declined     Current Medications (verified) Outpatient Encounter Medications as of 09/30/2023  Medication Sig   acetaminophen  (TYLENOL ) 650 MG CR tablet Take 650 mg by mouth at bedtime. arthritis   Ascorbic Acid (VITAMIN C PO) Take 1 tablet by mouth daily.   cetirizine (ZYRTEC) 10 MG tablet Take 10 mg by mouth at bedtime.   Cholecalciferol (VITAMIN D ) 50 MCG (2000 UT) tablet Take 4,000 Units by mouth daily.   fluorouracil (EFUDEX) 5 % cream 1 application to affected areas on face   FLUoxetine  (PROZAC ) 10 MG capsule TAKE ONE CAPSULE BY MOUTH DAILY   glimepiride  (AMARYL ) 4 MG tablet TAKE 1 TABLET EVERY DAY WITH BREAKFAST.   hydrochlorothiazide  (MICROZIDE ) 12.5 MG capsule Take 1 capsule (12.5 mg total) by mouth daily.   losartan  (COZAAR ) 100 MG tablet TAKE ONE TABLET BY MOUTH DAILY   Melatonin 10 MG CAPS Take by mouth.   metFORMIN  (GLUCOPHAGE ) 500 MG tablet TAKE TWO TABLETS BY MOUTH TWICE DAILY AFTER MEALS   Multiple Vitamin (MULTIVITAMIN) tablet Take 1 tablet by mouth daily.  Naphazoline-Pheniramine (OPCON-A OP) Place 1 drop into both eyes daily as needed (irritation/redness).   naproxen sodium (ALEVE) 220 MG tablet Take 220 mg by mouth daily as needed (shoulder pain).   ONETOUCH ULTRA test strip CHECK BLOOD SUGAR TWICE DAILY   pravastatin  (PRAVACHOL ) 20 MG tablet TAKE ONE TABLET BY MOUTH DAILY   triamcinolone (NASACORT) 55 MCG/ACT AERO nasal inhaler Place 2 sprays  into the nose at bedtime as needed (congestion).   warfarin (COUMADIN ) 2 MG tablet TAKE 1/2 TABLET BY MOUTH DAILY EXCEPT TAKE 1 TABLET ON FRIDAYS OR AS DIRECTED BY ANTICOAGULATION CLINIC   No facility-administered encounter medications on file as of 09/30/2023.    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 Jolly Needle   PPM GENERATOR CHANGEOUT N/A 02/08/2020   Procedure: PPM GENERATOR CHANGEOUT;  Surgeon: Jolly Needle, MD;  Location: [facility];  Service: Cardiovascular;  Laterality: N/A;   SKIN CANCER EXCISION     above nose    Family History  Problem Relation Age of Onset   Heart attack Paternal Grandfather 11   Diabetes Paternal Uncle    Diabetes Maternal Grandfather    Skin cancer Father    Emphysema Father    Coronary artery disease Father        stent   Diabetes Maternal Aunt    Diabetes Maternal Uncle    Stroke Neg Hx    Social History   Socioeconomic History   Marital status: Married    Spouse name: Marily Shows   Number of children: Not on file   Years of education: Not on file   Highest  education level: Not on file  Occupational History   Occupation: RETIRED  Tobacco Use   Smoking status: Former    Current packs/day: 0.00    Average packs/day: 2.0 packs/day for 10.0 years (20.0 ttl pk-yrs)    Types: Cigarettes    Start date: 04/28/1975    Quit date: 04/27/1985    Years since quitting: 38.4   Smokeless tobacco: Never   Tobacco comments:    smoked age [age], maximum regular consumption up to 1 ppd  Vaping Use   Vaping status: Never Used  Substance and Sexual Activity   Alcohol use: No   Drug use: No   Sexual activity: Not on file  Other Topics Concern   Not on file  Social History Narrative   Lives with wife/2025   Social Drivers of Health  Financial Resource Strain: Low Risk  (09/30/2023)   Overall Financial Resource Strain (CARDIA)    Difficulty of Paying Living Expenses: Not hard at all  Food Insecurity: No Food Insecurity (09/30/2023)   Hunger Vital Sign    Worried About Running Out of Food in the Last Year: Never true    Ran Out of Food in the Last Year: Never true  Transportation Needs: No Transportation Needs (09/30/2023)   PRAPARE - Administrator, Civil Service (Medical): No    Lack of Transportation (Non-Medical): No  Physical Activity: Inactive (09/30/2023)   Exercise Vital Sign    Days of Exercise per Week: 0 days    Minutes of Exercise per Session: 0 min  Stress: No Stress Concern Present (09/30/2023)   [employer] of Occupational Health - Occupational Stress Questionnaire    Feeling of Stress : Not at all  Social Connections: Moderately Isolated (09/30/2023)   Social Connection and Isolation Panel [NHANES]    Frequency of Communication with Friends and Family: More than three times a week    Frequency of Social Gatherings with Friends and Family: More than three times a week    Attends Religious Services: Never    Database administrator or Organizations: No    Attends Engineer, structural: Never    Marital Status: Married     Tobacco Counseling Counseling given: Not Answered Tobacco comments: smoked age [age], maximum regular consumption up to 1 ppd    Clinical Intake:  Pre-visit preparation completed: Yes  Pain : No/denies pain     BMI - recorded: 25.52 Nutritional Status: BMI 25 -29 Overweight Nutritional Risks: None Diabetes: Yes Did pt. bring in CBG monitor from home?: No  Lab Results  Component Value Date   HGBA1C 7.9 (H) 06/29/2023   HGBA1C 7.6 (H) 12/18/2022   HGBA1C 8.6 (H) 06/19/2022     How often do you need to have someone help you when you read instructions, pamphlets, or other written materials from your doctor or pharmacy?: 1 - Never  Interpreter Needed?: No  Information entered by :: Zaila Crew, RMA   Activities of Daily Living     09/28/2023    6:20 AM  In your present state of health, do you have any difficulty performing the following activities:  Hearing? 0  Vision? 0  Difficulty concentrating or making decisions? 0  Walking or climbing stairs? 0  Dressing or bathing? 0  Doing errands, shopping? 0  Preparing Food and eating ? N  Using the Toilet? N  In the past six months, have you accidently leaked urine? N  Do you have problems with loss of bowel control? N  Managing your Medications? N  Managing your Finances? N  Housekeeping or managing your Housekeeping? N    Patient Care Team: Colene Dauphin, MD as PCP - General (Internal Medicine) Lei Pump, MD as PCP - Electrophysiology (Cardiology) Jonathan Neighbor, [facility] (Inactive) (Pharmacist) Garfield Jungling as Consulting Physician (Optometry) Lei Pump, MD as Consulting Physician (Cardiology) [place], Thornell Flirt, MD as Referring Physician (Dermatology)  I have updated your Care Teams any recent Medical Services you may have received from other providers in the past year.     Assessment:    This is a routine wellness examination for Albert Hawkins.  Hearing/Vision screen Hearing  Screening - Comments:: Denies hearing difficulties   Vision Screening - Comments:: Wears eyeglasses/   Goals Addressed  This Visit's Progress     Cut out extra servings (pt-stated)   Improving     To lose a couple of pounds; would like to be at 190.       Depression Screen     09/30/2023    2:09 PM 12/18/2022    1:04 PM 09/29/2022    2:29 PM 06/19/2022    9:48 AM 12/15/2021    1:09 PM 09/26/2021    2:39 PM 09/24/2020   11:23 AM  PHQ 2/9 Scores  PHQ - 2 Score 0 0 0 0 1 0 0  PHQ- 9 Score 0 6  5 4       Fall Risk     09/28/2023    6:20 AM 12/18/2022    1:03 PM 09/29/2022    2:28 PM 09/25/2022    4:02 PM 09/10/2022    9:30 AM  Fall Risk   Falls in the past year? 0 0 0 0 0  Number falls in past yr: 0 0 0 0 0  Injury with Fall? 0 0 0 0 0  Risk for fall due to :  No Fall Risks No Fall Risks    Follow up Falls evaluation completed;Falls prevention discussed Falls evaluation completed Falls prevention discussed;Education provided;Falls evaluation completed      MEDICARE RISK AT HOME:  Medicare Risk at Home Any stairs in or around the home?: (Patient-Rptd) Yes If so, are there any without handrails?: (Patient-Rptd) No Home free of loose throw rugs in walkways, pet beds, electrical cords, etc?: (Patient-Rptd) Yes Adequate lighting in your home to reduce risk of falls?: (Patient-Rptd) Yes Life alert?: (Patient-Rptd) No Use of a cane, walker or w/c?: (Patient-Rptd) No Grab bars in the bathroom?: (Patient-Rptd) Yes Shower chair or bench in shower?: (Patient-Rptd) Yes Elevated toilet seat or a handicapped toilet?: (Patient-Rptd) No  TIMED UP AND GO:  Was the test performed?  No  Cognitive Function: 6CIT completed        09/30/2023    2:06 PM 09/29/2022    2:32 PM 09/26/2021    2:45 PM  6CIT Screen  What Year? 0 points 0 points 0 points  What month? 0 points 0 points 0 points  What time? 0 points 0 points 0 points  Count back from 20 0 points 0 points 0 points   Months in reverse 0 points 0 points 0 points  Repeat phrase 0 points 0 points 0 points  Total Score 0 points 0 points 0 points    Immunizations Immunization History  Administered Date(s) Administered   Fluad Quad(high Dose 65+) 02/17/2019, 02/06/2020, 01/30/2021, 02/10/2022   Fluad Trivalent(High Dose 65+) 01/22/2023   Influenza Split 02/12/2011, 02/02/2012   Influenza Whole 02/25/2006, 04/14/2007, 02/16/2008, 02/18/2009, 02/03/2010   Influenza, High Dose Seasonal PF 02/02/2014, 01/22/2015, 01/24/2016, 01/15/2017, 01/25/2018   Influenza-Unspecified 01/25/2013   PFIZER(Purple Top)SARS-COV-2 Vaccination 05/16/2019, 06/05/2019, 05/03/2020   Pneumococcal Conjugate-13 12/04/2014   Pneumococcal Polysaccharide-23 04/01/2009    Screening Tests Health Maintenance  Topic Date Due   DTaP/Tdap/Td (1 - Tdap) Never done   Zoster Vaccines- Shingrix (1 of 2) Never done   FOOT EXAM  11/05/2020   COVID-19 Vaccine (4 - 2024-25 season) 12/27/2022   OPHTHALMOLOGY EXAM  06/06/2023   INFLUENZA VACCINE  11/26/2023   Diabetic kidney evaluation - Urine ACR  12/18/2023   HEMOGLOBIN A1C  12/30/2023   Diabetic kidney evaluation - eGFR measurement  06/28/2024   Medicare Annual Wellness (AWV)  09/29/2024   Pneumonia Vaccine 64+ Years old  Completed   HPV VACCINES  Aged Out   Meningococcal B Vaccine  Aged Out   Fecal DNA (Cologuard)  Discontinued    Health Maintenance  Health Maintenance Due  Topic Date Due   DTaP/Tdap/Td (1 - Tdap) Never done   Zoster Vaccines- Shingrix (1 of 2) Never done   FOOT EXAM  11/05/2020   COVID-19 Vaccine (4 - 2024-25 season) 12/27/2022   OPHTHALMOLOGY EXAM  06/06/2023   Health Maintenance Items Addressed: Diabetic Foot Exam recommended, See Nurse Notes at the end of this note  Additional Screening:  Vision Screening: Recommended annual ophthalmology exams for early detection of glaucoma and other disorders of the eye. Would you like a referral to an eye doctor? No     Dental Screening: Recommended annual dental exams for proper oral hygiene  Community Resource Referral / Chronic Care Management: CRR required this visit?  No   CCM required this visit?  No   Plan:    I have personally reviewed and noted the following in the patient's chart:   Medical and social history Use of alcohol, tobacco or illicit drugs  Current medications and supplements including opioid prescriptions. Patient is not currently taking opioid prescriptions. Functional ability and status Nutritional status Physical activity Advanced directives List of other physicians Hospitalizations, surgeries, and ER visits in previous 12 months Vitals Screenings to include cognitive, depression, and falls Referrals and appointments  In addition, I have reviewed and discussed with patient certain preventive protocols, quality metrics, and best practice recommendations. A written personalized care plan for preventive services as well as general preventive health recommendations were provided to patient.   Albert Hawkins Albert Hawkins, CMA   09/30/2023   After Visit Summary: (MyChart) Due to this being a telephonic visit, the after visit summary with patients personalized plan was offered to patient via MyChart   Notes: Please refer to Routing Comments.

## 2023-09-30 NOTE — Patient Instructions (Addendum)
 Mr. Albert Hawkins , Thank you for taking time out of your busy schedule to complete your Annual Wellness Visit with me. I enjoyed our conversation and look forward to speaking with you again next year. I, as well as your care team,  appreciate your ongoing commitment to your health goals. Please review the following plan we discussed and let me know if I can assist you in the future. Your Game plan/ To Do List    Follow up Visits: Next Medicare AWV with our clinical staff: 10/05/2024.   Have you seen your provider in the last 6 months (3 months if uncontrolled diabetes)? Yes Next Office Visit with your provider: 11/29/2023.  Clinician Recommendations:  Aim for 30 minutes of exercise or brisk walking, 6-8 glasses of water, and 5 servings of fruits and vegetables each day. You are due for a tetanus vaccine and a shingles vaccine.  These can be given at your local pharmacy.  You are also due for a foot exam during your next office visit.      This is a list of the screening recommended for you and due dates:  Health Maintenance  Topic Date Due   DTaP/Tdap/Td vaccine (1 - Tdap) Never done   Zoster (Shingles) Vaccine (1 of 2) Never done   Complete foot exam   11/05/2020   COVID-19 Vaccine (4 - 2024-25 season) 12/27/2022   Eye exam for diabetics  06/06/2023   Flu Shot  11/26/2023   Yearly kidney health urinalysis for diabetes  12/18/2023   Hemoglobin A1C  12/30/2023   Yearly kidney function blood test for diabetes  06/28/2024   Medicare Annual Wellness Visit  09/29/2024   Pneumonia Vaccine  Completed   HPV Vaccine  Aged Out   Meningitis B Vaccine  Aged Out   Cologuard (Stool DNA test)  Discontinued    Advanced directives: (Copy Requested) Please bring a copy of your health care power of attorney and living will to the office to be added to your chart at your convenience. You can mail to [facility] [address] [place], [place] [address] or email to  [email or web address] Advance Care Planning is important because it:  [x]  Makes sure you receive the medical care that is consistent with your values, goals, and preferences  [x]  It provides guidance to your family and loved ones and reduces their decisional burden about whether or not they are making the right decisions based on your wishes.  Follow the link provided in your after visit summary or read over the paperwork we have mailed to you to help you started getting your Advance Directives in place. If you need assistance in completing these, please reach out to us  so that we can help you!  See attachments for Preventive Care and Fall Prevention Tips.

## 2023-09-30 NOTE — Progress Notes (Signed)
 Remote pacemaker transmission.

## 2023-10-26 ENCOUNTER — Ambulatory Visit (INDEPENDENT_AMBULATORY_CARE_PROVIDER_SITE_OTHER)

## 2023-10-26 DIAGNOSIS — Z7901 Long term (current) use of anticoagulants: Secondary | ICD-10-CM | POA: Diagnosis not present

## 2023-10-26 LAB — POCT INR: INR: 3.4 — AB (ref 2.0–3.0)

## 2023-10-26 NOTE — Patient Instructions (Addendum)
Pre visit review using our clinic review tool, if applicable. No additional management support is needed unless otherwise documented below in the visit note.  Hold warfarin today and then change weekly dose to take 1/2 tablet daily.Recheck in 3 weeks.

## 2023-10-26 NOTE — Progress Notes (Signed)
 Pt has been eating a lot of cherries in the last couple of days.  Hold warfarin today and then change weekly dose to take 1/2 tablet daily. Recheck in 3 weeks.

## 2023-11-08 ENCOUNTER — Other Ambulatory Visit: Payer: Self-pay | Admitting: Internal Medicine

## 2023-11-16 ENCOUNTER — Ambulatory Visit: Payer: PPO

## 2023-11-16 ENCOUNTER — Ambulatory Visit (INDEPENDENT_AMBULATORY_CARE_PROVIDER_SITE_OTHER)

## 2023-11-16 DIAGNOSIS — I442 Atrioventricular block, complete: Secondary | ICD-10-CM | POA: Diagnosis not present

## 2023-11-16 DIAGNOSIS — Z7901 Long term (current) use of anticoagulants: Secondary | ICD-10-CM | POA: Diagnosis not present

## 2023-11-16 LAB — POCT INR: INR: 3.6 — AB (ref 2.0–3.0)

## 2023-11-16 NOTE — Patient Instructions (Addendum)
 Pre visit review using our clinic review tool, if applicable. No additional management support is needed unless otherwise documented below in the visit note.  Hold warfarin today and then change weekly dose to take 1/2 tablet daily except take no warfarin on Tuesdays. Recheck in 2 weeks.

## 2023-11-16 NOTE — Progress Notes (Signed)
 Pt report he has been using Aleve over the last 3 days due to arthritis pain. Advised this interacts with warfarin and will increase bleeding risk. Pt reports he is aware but would rather have his INR between 3.5-4.0, because he said that was his range with his prior provider. Advised for his diagnosis 2.0-3.0 is the correct range for him and higher than 3 is an increased bleeding risk. Advised to stop using Aleve and use Tylenol  instead. Advised if any s/s of abnormal bruising or bleeding to go to the ER. Pt verbalized understanding.  Advised it would be best to change warfarin pill dosage to 1 mg due to pt being on only 1/2 tablet daily up until today. We will try one day weekly of holding warfarin. If this change is too extensive then advised pt will definitely need to change to a 1 mg tablet.  Hold warfarin today and then change weekly dose to take 1/2 tablet daily except take no warfarin on Tuesdays. Recheck in 2 weeks.

## 2023-11-17 LAB — CUP PACEART REMOTE DEVICE CHECK
Battery Remaining Longevity: 93 mo
Battery Remaining Percentage: 72 %
Battery Voltage: 3.01 V
Brady Statistic RV Percent Paced: 84 %
Date Time Interrogation Session: 20250722020013
Implantable Lead Connection Status: 753985
Implantable Lead Connection Status: 753985
Implantable Lead Implant Date: 20100216
Implantable Lead Implant Date: 20100216
Implantable Lead Location: 753859
Implantable Lead Location: 753860
Implantable Pulse Generator Implant Date: 20211014
Lead Channel Impedance Value: 430 Ohm
Lead Channel Pacing Threshold Amplitude: 1 V
Lead Channel Pacing Threshold Pulse Width: 0.5 ms
Lead Channel Sensing Intrinsic Amplitude: 6 mV
Lead Channel Setting Pacing Amplitude: 1.25 V
Lead Channel Setting Pacing Pulse Width: 0.5 ms
Lead Channel Setting Sensing Sensitivity: 2 mV
Pulse Gen Model: 2272
Pulse Gen Serial Number: 3873301

## 2023-11-22 ENCOUNTER — Ambulatory Visit: Payer: Self-pay | Admitting: Cardiology

## 2023-11-28 ENCOUNTER — Encounter: Payer: Self-pay | Admitting: Internal Medicine

## 2023-11-28 NOTE — Progress Notes (Unsigned)
 Subjective:    Patient ID: Albert Hawkins, male    DOB: 1941-02-26, 83 y.o.   MRN: [id number]     HPI Albert Hawkins is here for follow up of his chronic medical problems.  Eats a lot of crackers.  Eats cereal for breakfast.   Does not want to take any of the medication.  Medications and allergies reviewed with patient and updated if appropriate.  Current Outpatient Medications on File Prior to Visit  Medication Sig Dispense Refill   acetaminophen  (TYLENOL ) 650 MG CR tablet Take 650 mg by mouth at bedtime. arthritis     Ascorbic Acid (VITAMIN C PO) Take 1 tablet by mouth daily.     cetirizine (ZYRTEC) 10 MG tablet Take 10 mg by mouth at bedtime.     Cholecalciferol (VITAMIN D ) 50 MCG (2000 UT) tablet Take 4,000 Units by mouth daily.     fluorouracil (EFUDEX) 5 % cream 1 application to affected areas on face     FLUoxetine  (PROZAC ) 10 MG capsule TAKE ONE CAPSULE BY MOUTH DAILY 90 capsule 1   glimepiride  (AMARYL ) 4 MG tablet TAKE 1 TABLET EVERY DAY WITH BREAKFAST. 90 tablet 1   hydrochlorothiazide  (MICROZIDE ) 12.5 MG capsule Take 1 capsule (12.5 mg total) by mouth daily. 90 capsule 1   losartan  (COZAAR ) 100 MG tablet TAKE ONE TABLET BY MOUTH DAILY 90 tablet 1   Melatonin 10 MG CAPS Take by mouth.     metFORMIN  (GLUCOPHAGE ) 500 MG tablet TAKE TWO TABLETS BY MOUTH TWICE DAILY AFTER MEALS 360 tablet 1   Multiple Vitamin (MULTIVITAMIN) tablet Take 1 tablet by mouth daily.     Naphazoline-Pheniramine (OPCON-A OP) Place 1 drop into both eyes daily as needed (irritation/redness).     naproxen sodium (ALEVE) 220 MG tablet Take 220 mg by mouth daily as needed (shoulder pain).     ONETOUCH ULTRA test strip CHECK BLOOD SUGAR TWICE DAILY 100 strip 0   pravastatin  (PRAVACHOL ) 20 MG tablet TAKE ONE TABLET BY MOUTH DAILY 90 tablet 1   triamcinolone (NASACORT) 55 MCG/ACT AERO nasal inhaler Place 2 sprays into the nose at bedtime as needed (congestion).     warfarin (COUMADIN ) 2 MG tablet TAKE 1/2  TABLET BY MOUTH DAILY EXCEPT TAKE 1 TABLET ON FRIDAYS OR AS DIRECTED BY ANTICOAGULATION CLINIC 75 tablet 1   No current facility-administered medications on file prior to visit.     Review of Systems     Objective:   Vitals:   11/29/23 1357  BP: (!) 104/58  Pulse: 63  Temp: 97.8 F (36.6 C)  SpO2: 97%   BP Readings from Last 3 Encounters:  11/29/23 (!) 104/58  06/29/23 122/74  12/18/22 124/78   Wt Readings from Last 3 Encounters:  11/29/23 184 lb (83.5 kg)  09/30/23 183 lb (83 kg)  06/29/23 183 lb (83 kg)   Body mass index is 25.66 kg/m.    Physical Exam Constitutional:      General: He is not in acute distress.    Appearance: Normal appearance. He is not ill-appearing.  HENT:     Head: Normocephalic and atraumatic.  Skin:    General: Skin is warm and dry.  Neurological:     Mental Status: He is alert. Mental status is at baseline.  Psychiatric:        Mood and Affect: Mood normal.        Behavior: Behavior normal.        Thought Content: Thought content normal.  Judgment: Judgment normal.        Lab Results  Component Value Date   WBC 7.1 06/29/2023   HGB 14.2 06/29/2023   HCT 43.1 06/29/2023   PLT 184.0 06/29/2023   GLUCOSE 238 (H) 06/29/2023   CHOL 147 06/29/2023   TRIG 216.0 (H) 06/29/2023   HDL 47.10 06/29/2023   LDLDIRECT 104.0 12/18/2022   LDLCALC 56 06/29/2023   ALT 23 06/29/2023   AST 26 06/29/2023   NA 135 06/29/2023   K 4.4 06/29/2023   CL 98 06/29/2023   CREATININE 1.21 06/29/2023   BUN 17 06/29/2023   CO2 28 06/29/2023   TSH 1.31 06/29/2023   PSA 2.36 03/17/2007   INR 3.6 (A) 11/16/2023   HGBA1C 7.8 (A) 11/29/2023   MICROALBUR 9.6 (H) 04/07/2010     Assessment & Plan:    See Problem List for Assessment and Plan of chronic medical problems.

## 2023-11-28 NOTE — Patient Instructions (Incomplete)
    Give a urine sample .      Medications changes include :   None    A referral was ordered and someone will call you to schedule an appointment.     No follow-ups on file.

## 2023-11-29 ENCOUNTER — Ambulatory Visit (INDEPENDENT_AMBULATORY_CARE_PROVIDER_SITE_OTHER): Admitting: Internal Medicine

## 2023-11-29 ENCOUNTER — Encounter: Payer: Self-pay | Admitting: Internal Medicine

## 2023-11-29 VITALS — BP 104/58 | HR 63 | Temp 97.8°F | Ht 71.0 in | Wt 184.0 lb

## 2023-11-29 DIAGNOSIS — E1159 Type 2 diabetes mellitus with other circulatory complications: Secondary | ICD-10-CM | POA: Diagnosis not present

## 2023-11-29 DIAGNOSIS — Z7984 Long term (current) use of oral hypoglycemic drugs: Secondary | ICD-10-CM

## 2023-11-29 DIAGNOSIS — I4891 Unspecified atrial fibrillation: Secondary | ICD-10-CM

## 2023-11-29 DIAGNOSIS — F3289 Other specified depressive episodes: Secondary | ICD-10-CM

## 2023-11-29 DIAGNOSIS — I10 Essential (primary) hypertension: Secondary | ICD-10-CM | POA: Diagnosis not present

## 2023-11-29 DIAGNOSIS — N1831 Chronic kidney disease, stage 3a: Secondary | ICD-10-CM | POA: Diagnosis not present

## 2023-11-29 DIAGNOSIS — E7849 Other hyperlipidemia: Secondary | ICD-10-CM | POA: Diagnosis not present

## 2023-11-29 LAB — POCT GLYCOSYLATED HEMOGLOBIN (HGB A1C): Hemoglobin A1C: 7.8 % — AB (ref 4.0–5.6)

## 2023-11-29 NOTE — Assessment & Plan Note (Signed)
 Chronic Controlled, Stable Continue fluoxetine 10 mg daily

## 2023-11-29 NOTE — Assessment & Plan Note (Signed)
Chronic Regular exercise and healthy diet encouraged Continue pravastatin 20 mg daily 

## 2023-11-29 NOTE — Assessment & Plan Note (Signed)
 Chronic Following with cardiology On warfarin-managed by our Coumadin  nurse Asymptomatic

## 2023-11-29 NOTE — Assessment & Plan Note (Signed)
 Chronic Stable Stressed good water intake Avoid NSAIDs Stressed good blood pressure and sugar control

## 2023-11-29 NOTE — Assessment & Plan Note (Addendum)
 Chronic  Lab Results  Component Value Date   HGBA1C 7.9 (H) 06/29/2023   Sugars not ideally controlled Check A1c Continue glimepiride  4 mg daily, metformin  1000 mg twice daily I did advised Trulicity that he did not want to take any other medication -he has worked on lifestyle, but sugars are not much better Stressed regular exercise, diabetic diet Reviewed specific things in his diet that he cannot take specifically crackers, cereal, bagels which he eats a lot of Will give him an additional 3 months per her's request and recheck A1c at that time-if still elevated we will consider medication

## 2023-11-29 NOTE — Assessment & Plan Note (Signed)
 Chronic Blood pressure well controlled Continue losartan  100 mg daily, HCTZ 12.5 mg daily

## 2023-11-30 ENCOUNTER — Ambulatory Visit (INDEPENDENT_AMBULATORY_CARE_PROVIDER_SITE_OTHER)

## 2023-11-30 DIAGNOSIS — Z7901 Long term (current) use of anticoagulants: Secondary | ICD-10-CM

## 2023-11-30 LAB — POCT INR: INR: 2.9 (ref 2.0–3.0)

## 2023-11-30 NOTE — Progress Notes (Signed)
 Pt was advised at last coumadin  clinic apt that it would be best to change warfarin pill dosage to 1 mg due to only taking 1/2 tablet daily and one day a week he doesn't take warfarin at all. Cannot reduce daily tablet any further. Advised if his INR creeps up above his range again we will change to a 1 mg tablet.  Continue 1/2 tablet daily except take no warfarin on Tuesdays. Recheck in 5 weeks.

## 2023-11-30 NOTE — Patient Instructions (Addendum)
 Pre visit review using our clinic review tool, if applicable. No additional management support is needed unless otherwise documented below in the visit note.  Continue 1/2 tablet daily except take no warfarin on Tuesdays. Recheck in 5 weeks.

## 2024-01-04 ENCOUNTER — Ambulatory Visit (INDEPENDENT_AMBULATORY_CARE_PROVIDER_SITE_OTHER)

## 2024-01-04 DIAGNOSIS — Z7901 Long term (current) use of anticoagulants: Secondary | ICD-10-CM | POA: Diagnosis not present

## 2024-01-04 LAB — POCT INR: INR: 6.3 — AB (ref 2.0–3.0)

## 2024-01-04 NOTE — Patient Instructions (Addendum)
 Pre visit review using our clinic review tool, if applicable. No additional management support is needed unless otherwise documented below in the visit note.  Hold warfarin today and tomorrow and take 1/2 tablet on Thursday. Recheck on Friday, 9/12.

## 2024-01-04 NOTE — Progress Notes (Addendum)
 Pt was advised in prior coumadin  clinic apt that it would be best to change warfarin pill dosage to 1 mg due to only taking 1/2 tablet daily and one day a week he doesn't take warfarin at all. Cannot reduce daily tablet any further. Advised if his INR creeps up above his range again we will change to a 1 mg tablet.  Pt reports taking Aleve for the last two days. Pt has been educated about the interaction with Aleve and warfarin in the past. He reports he will continue to take it as needed because it is the only thing that works for his pain. Pt reports he uses it sparingly but will continue to use it. This supratherapeutic INR has been seen in the past with this pt when he was taking Aleve. Pt denies any s/s of abnormal bruising or bleeding. Advised if any s/s to go to ER. Pt verbalized understanding.  Coumadin  clinic protocols are to check any POCT INR > 6 with a lab INR. Pt refused lab INR today. Explained why it was needed but pt still refused lab INR. Hold warfarin today and tomorrow and take 1/2 tablet on Thursday. Recheck on Friday, 9/12.

## 2024-01-07 ENCOUNTER — Ambulatory Visit

## 2024-01-07 DIAGNOSIS — Z7901 Long term (current) use of anticoagulants: Secondary | ICD-10-CM | POA: Diagnosis not present

## 2024-01-07 LAB — POCT INR: INR: 4.5 — AB (ref 2.0–3.0)

## 2024-01-07 MED ORDER — WARFARIN SODIUM 1 MG PO TABS: ORAL_TABLET | ORAL | 1 refills | Status: AC

## 2024-01-07 NOTE — Progress Notes (Signed)
 Pt had supratherapeutic INR at last check. This was due to pt taking Aleve for 2 days. Pt was educated to stop taking this. Discussed with PCP after supratherapeutic INR of the Aleve and she recommended inquiring if pt would like to use tramadol instead of Aleve.  Pt refused tramadol script. He reports he has taken it in the past and it made him feel drunk.  Pt was advised in prior coumadin  clinic apt that it would be best to change warfarin pill dosage to 1 mg due to only taking 1/2 tablet daily and one day a week he doesn't take warfarin at all. Cannot reduce daily tablet any further. Advised if his INR creeps up above his range again we will change to a 1 mg tablet. Pt finally agreed to change pill dose of warfarin. Advised pt he is to pick up new script of warfarin and it will be a 1 mg and to set the 2 mg tablets aside and do not get them confused. Pt verbalized understanding and reports he will pick up the new script by 9/14.  Hold warfarin today and tomorrow and then change weekly dose to take 1 tablet (1 mg) daily except take 1/2 tablet (0.5 mg) on Monday, Wednesday and Friday. Recheck on Friday, 9/19. Pt denies any s/s of bleeding or abnormal bruising. Advised if any s/s to go to ER. Pt verbalized understanding.

## 2024-01-07 NOTE — Patient Instructions (Addendum)
 Pre visit review using our clinic review tool, if applicable. No additional management support is needed unless otherwise documented below in the visit note.  Hold warfarin today and tomorrow and then change weekly dose to take 1 tablet (1 mg) daily except take 1/2 tablet (0.5 mg) on Monday, Wednesday and Friday. Recheck on Friday, 9/19.

## 2024-01-14 ENCOUNTER — Ambulatory Visit (INDEPENDENT_AMBULATORY_CARE_PROVIDER_SITE_OTHER)

## 2024-01-14 DIAGNOSIS — Z7901 Long term (current) use of anticoagulants: Secondary | ICD-10-CM

## 2024-01-14 LAB — POCT INR: INR: 2.5 (ref 2.0–3.0)

## 2024-01-14 NOTE — Progress Notes (Signed)
 Pill dosage size was changed at last apt to allow for more precise dosing.  Continue 1 tablet (1 mg) daily except take 1/2 tablet (0.5 mg) on Monday, Wednesday and Friday. Recheck on Friday, 9/26.  Pt requested for he and his wife to receive the flu vaccine next week. Placed pt's wife on nurse schedule and will vaccine both at coumadin  clinic apt.

## 2024-01-14 NOTE — Patient Instructions (Addendum)
 Pre visit review using our clinic review tool, if applicable. No additional management support is needed unless otherwise documented below in the visit note.  Continue 1 tablet (1 mg) daily except take 1/2 tablet (0.5 mg) on Monday, Wednesday and Friday. Recheck on Friday, 9/26.

## 2024-01-21 ENCOUNTER — Ambulatory Visit (INDEPENDENT_AMBULATORY_CARE_PROVIDER_SITE_OTHER)

## 2024-01-21 DIAGNOSIS — Z23 Encounter for immunization: Secondary | ICD-10-CM | POA: Diagnosis not present

## 2024-01-21 DIAGNOSIS — Z7901 Long term (current) use of anticoagulants: Secondary | ICD-10-CM | POA: Diagnosis not present

## 2024-01-21 LAB — POCT INR: INR: 2.2 (ref 2.0–3.0)

## 2024-01-21 NOTE — Patient Instructions (Addendum)
 Pre visit review using our clinic review tool, if applicable. No additional management support is needed unless otherwise documented below in the visit note.  Continue 1 tablet (1 mg) daily except take 1/2 tablet (0.5 mg) on Monday, Wednesday and Friday. Recheck on Friday, 10/24.

## 2024-01-21 NOTE — Progress Notes (Signed)
 Pill dosage size was changed at last apt to allow for more precise dosing.  Continue 1 tablet (1 mg) daily except take 1/2 tablet (0.5 mg) on Monday, Wednesday and Friday. Recheck on Friday, 10/24.  Pt requested for he receive the high dose flu vaccine today. Vaccine administered. Pt tolerated well.

## 2024-01-28 NOTE — Progress Notes (Signed)
 Remote PPM Transmission

## 2024-02-06 ENCOUNTER — Other Ambulatory Visit: Payer: Self-pay | Admitting: Internal Medicine

## 2024-02-15 ENCOUNTER — Ambulatory Visit: Payer: PPO

## 2024-02-15 DIAGNOSIS — I442 Atrioventricular block, complete: Secondary | ICD-10-CM

## 2024-02-16 LAB — CUP PACEART REMOTE DEVICE CHECK
Battery Remaining Longevity: 92 mo
Battery Remaining Percentage: 70 %
Battery Voltage: 3.01 V
Brady Statistic RV Percent Paced: 84 %
Date Time Interrogation Session: 20251021020012
Implantable Lead Connection Status: 753985
Implantable Lead Connection Status: 753985
Implantable Lead Implant Date: 20100216
Implantable Lead Implant Date: 20100216
Implantable Lead Location: 753859
Implantable Lead Location: 753860
Implantable Pulse Generator Implant Date: 20211014
Lead Channel Impedance Value: 430 Ohm
Lead Channel Pacing Threshold Amplitude: 1 V
Lead Channel Pacing Threshold Pulse Width: 0.5 ms
Lead Channel Sensing Intrinsic Amplitude: 7 mV
Lead Channel Setting Pacing Amplitude: 1.25 V
Lead Channel Setting Pacing Pulse Width: 0.5 ms
Lead Channel Setting Sensing Sensitivity: 2 mV
Pulse Gen Model: 2272
Pulse Gen Serial Number: 3873301

## 2024-02-18 ENCOUNTER — Telehealth: Payer: Self-pay

## 2024-02-18 ENCOUNTER — Ambulatory Visit: Payer: Self-pay | Admitting: Cardiology

## 2024-02-18 ENCOUNTER — Ambulatory Visit

## 2024-02-18 NOTE — Progress Notes (Signed)
 Remote PPM Transmission

## 2024-02-18 NOTE — Telephone Encounter (Signed)
 Returned call. Pt was on phone with someone else. Advised ot call next week to RS.

## 2024-02-18 NOTE — Telephone Encounter (Signed)
 Copied from CRM [id number]. Topic: General - Other >> Feb 18, 2024  2:05 PM Jasmin G wrote: Reason for CRM: Pt called regarding recent missed call from Ms. Ethyl Kirsch, RN. Call pt back at [phone].

## 2024-02-18 NOTE — Telephone Encounter (Signed)
 Pt NS coumadin  clinic apt today. Contacted pt and he reports he forgot about it. He will call back to schedule once he looks at his calendar.  He has an apt with PCP on 11/4 and may want to make it the same they.

## 2024-02-22 NOTE — Telephone Encounter (Signed)
 Contacted pt and scheduled for coumadin  clinic apt for 11/4 before his apt with PCP. Pt verbalized understanding.

## 2024-02-28 ENCOUNTER — Encounter: Payer: Self-pay | Admitting: Internal Medicine

## 2024-02-28 NOTE — Progress Notes (Unsigned)
 Subjective:    Patient ID: Albert Hawkins, male    DOB: 1940/08/13, 83 y.o.   MRN: [id number]     HPI Albert Hawkins is here for follow up of his chronic medical problems.  Not using CPAP.  No regular exercise.    Medications and allergies reviewed with patient and updated if appropriate.  Current Outpatient Medications on File Prior to Visit  Medication Sig Dispense Refill   acetaminophen  (TYLENOL ) 650 MG CR tablet Take 650 mg by mouth at bedtime. arthritis     Ascorbic Acid (VITAMIN C PO) Take 1 tablet by mouth daily.     cetirizine (ZYRTEC) 10 MG tablet Take 10 mg by mouth at bedtime.     Cholecalciferol (VITAMIN D ) 50 MCG (2000 UT) tablet Take 4,000 Units by mouth daily.     fluorouracil (EFUDEX) 5 % cream 1 application to affected areas on face     FLUoxetine  (PROZAC ) 10 MG capsule TAKE ONE CAPSULE BY MOUTH DAILY 90 capsule 1   glimepiride  (AMARYL ) 4 MG tablet TAKE 1 TABLET EVERY DAY WITH BREAKFAST. 90 tablet 1   hydrochlorothiazide  (MICROZIDE ) 12.5 MG capsule Take 1 capsule (12.5 mg total) by mouth daily. 90 capsule 1   losartan  (COZAAR ) 100 MG tablet TAKE ONE TABLET BY MOUTH DAILY 90 tablet 1   Melatonin 10 MG CAPS Take by mouth.     metFORMIN  (GLUCOPHAGE ) 500 MG tablet TAKE TWO TABLETS BY MOUTH TWICE DAILY AFTER MEALS 360 tablet 1   Multiple Vitamin (MULTIVITAMIN) tablet Take 1 tablet by mouth daily.     Naphazoline-Pheniramine (OPCON-A OP) Place 1 drop into both eyes daily as needed (irritation/redness).     naproxen sodium (ALEVE) 220 MG tablet Take 220 mg by mouth daily as needed (shoulder pain).     ONETOUCH ULTRA test strip CHECK BLOOD SUGAR TWICE DAILY 100 strip 0   pravastatin  (PRAVACHOL ) 20 MG tablet TAKE ONE TABLET BY MOUTH DAILY 90 tablet 1   triamcinolone (NASACORT) 55 MCG/ACT AERO nasal inhaler Place 2 sprays into the nose at bedtime as needed (congestion).     warfarin (COUMADIN ) 1 MG tablet TAKE 1 TABLET BY MOUTH DAILY EXCEPT TAKE 1/2 TABLET ON MONDAY,  WEDNESDAY AND FRIDAY OR AS DIRECTED BY ANTICOAGULATION CLINIC 90 tablet 1   warfarin (COUMADIN ) 2 MG tablet TAKE 1/2 TABLET BY MOUTH DAILY EXCEPT TAKE 1 TABLET ON FRIDAYS OR AS DIRECTED BY ANTICOAGULATION CLINIC 75 tablet 1   No current facility-administered medications on file prior to visit.     Review of Systems  Constitutional:  Positive for fatigue (in the morning after feeling he had a good sleep). Negative for fever.  Eyes:  Positive for pain.  Respiratory:  Negative for cough, shortness of breath and wheezing.   Cardiovascular:  Negative for chest pain, palpitations and leg swelling.  Neurological:  Positive for light-headedness (occ). Negative for headaches.       Objective:   Vitals:   02/29/24 1313  BP: 116/72  Pulse: 68  Temp: 98.2 F (36.8 C)  SpO2: 96%   BP Readings from Last 3 Encounters:  02/29/24 116/72  11/29/23 (!) 104/58  06/29/23 122/74   Wt Readings from Last 3 Encounters:  02/29/24 183 lb (83 kg)  11/29/23 184 lb (83.5 kg)  09/30/23 183 lb (83 kg)   Body mass index is 25.52 kg/m.    Physical Exam Constitutional:      General: He is not in acute distress.    Appearance: Normal  appearance. He is not ill-appearing.  HENT:     Head: Normocephalic and atraumatic.  Eyes:     Conjunctiva/sclera: Conjunctivae normal.  Cardiovascular:     Rate and Rhythm: Normal rate and regular rhythm.     Heart sounds: Murmur (2/6 sys) heard.  Pulmonary:     Effort: Pulmonary effort is normal. No respiratory distress.     Breath sounds: Normal breath sounds. No wheezing or rales.  Musculoskeletal:     Right lower leg: No edema.     Left lower leg: No edema.  Skin:    General: Skin is warm and dry.     Findings: No rash.  Neurological:     Mental Status: He is alert. Mental status is at baseline.  Psychiatric:        Mood and Affect: Mood normal.        Lab Results  Component Value Date   WBC 7.1 06/29/2023   HGB 14.2 06/29/2023   HCT 43.1  06/29/2023   PLT 184.0 06/29/2023   GLUCOSE 238 (H) 06/29/2023   CHOL 147 06/29/2023   TRIG 216.0 (H) 06/29/2023   HDL 47.10 06/29/2023   LDLDIRECT 104.0 12/18/2022   LDLCALC 56 06/29/2023   ALT 23 06/29/2023   AST 26 06/29/2023   NA 135 06/29/2023   K 4.4 06/29/2023   CL 98 06/29/2023   CREATININE 1.21 06/29/2023   BUN 17 06/29/2023   CO2 28 06/29/2023   TSH 1.31 06/29/2023   PSA 2.36 03/17/2007   INR 1.8 (A) 02/29/2024   HGBA1C 7.8 (A) 11/29/2023   MICROALBUR 9.6 (H) 04/07/2010     Assessment & Plan:    See Problem List for Assessment and Plan of chronic medical problems.

## 2024-02-28 NOTE — Patient Instructions (Addendum)
      Blood work was ordered.       Medications changes include :   None    A referral was ordered and someone will call you to schedule an appointment.     Return in about 6 months (around 08/28/2024) for Physical Exam.

## 2024-02-29 ENCOUNTER — Ambulatory Visit (INDEPENDENT_AMBULATORY_CARE_PROVIDER_SITE_OTHER)

## 2024-02-29 ENCOUNTER — Ambulatory Visit (INDEPENDENT_AMBULATORY_CARE_PROVIDER_SITE_OTHER): Admitting: Internal Medicine

## 2024-02-29 VITALS — BP 116/72 | HR 68 | Temp 98.2°F | Ht 71.0 in | Wt 183.0 lb

## 2024-02-29 DIAGNOSIS — E1122 Type 2 diabetes mellitus with diabetic chronic kidney disease: Secondary | ICD-10-CM | POA: Diagnosis not present

## 2024-02-29 DIAGNOSIS — I152 Hypertension secondary to endocrine disorders: Secondary | ICD-10-CM

## 2024-02-29 DIAGNOSIS — E785 Hyperlipidemia, unspecified: Secondary | ICD-10-CM | POA: Diagnosis not present

## 2024-02-29 DIAGNOSIS — N1831 Chronic kidney disease, stage 3a: Secondary | ICD-10-CM

## 2024-02-29 DIAGNOSIS — G4733 Obstructive sleep apnea (adult) (pediatric): Secondary | ICD-10-CM | POA: Diagnosis not present

## 2024-02-29 DIAGNOSIS — E1159 Type 2 diabetes mellitus with other circulatory complications: Secondary | ICD-10-CM

## 2024-02-29 DIAGNOSIS — I4891 Unspecified atrial fibrillation: Secondary | ICD-10-CM

## 2024-02-29 DIAGNOSIS — F3289 Other specified depressive episodes: Secondary | ICD-10-CM | POA: Diagnosis not present

## 2024-02-29 DIAGNOSIS — Z7901 Long term (current) use of anticoagulants: Secondary | ICD-10-CM

## 2024-02-29 DIAGNOSIS — Z95811 Presence of heart assist device: Secondary | ICD-10-CM | POA: Insufficient documentation

## 2024-02-29 DIAGNOSIS — E1169 Type 2 diabetes mellitus with other specified complication: Secondary | ICD-10-CM

## 2024-02-29 LAB — CBC WITH DIFFERENTIAL/PLATELET
Basophils Absolute: 0 K/uL (ref 0.0–0.1)
Basophils Relative: 0.6 % (ref 0.0–3.0)
Eosinophils Absolute: 0.2 K/uL (ref 0.0–0.7)
Eosinophils Relative: 3.4 % (ref 0.0–5.0)
HCT: 41 % (ref 39.0–52.0)
Hemoglobin: 13.5 g/dL (ref 13.0–17.0)
Lymphocytes Relative: 29.2 % (ref 12.0–46.0)
Lymphs Abs: 1.8 K/uL (ref 0.7–4.0)
MCHC: 33 g/dL (ref 30.0–36.0)
MCV: 88.5 fl (ref 78.0–100.0)
Monocytes Absolute: 0.6 K/uL (ref 0.1–1.0)
Monocytes Relative: 9.6 % (ref 3.0–12.0)
Neutro Abs: 3.5 K/uL (ref 1.4–7.7)
Neutrophils Relative %: 57.2 % (ref 43.0–77.0)
Platelets: 171 K/uL (ref 150.0–400.0)
RBC: 4.63 Mil/uL (ref 4.22–5.81)
RDW: 14.3 % (ref 11.5–15.5)
WBC: 6.1 K/uL (ref 4.0–10.5)

## 2024-02-29 LAB — COMPREHENSIVE METABOLIC PANEL WITH GFR
ALT: 17 U/L (ref 0–53)
AST: 21 U/L (ref 0–37)
Albumin: 4.2 g/dL (ref 3.5–5.2)
Alkaline Phosphatase: 51 U/L (ref 39–117)
BUN: 17 mg/dL (ref 6–23)
CO2: 27 meq/L (ref 19–32)
Calcium: 8.9 mg/dL (ref 8.4–10.5)
Chloride: 100 meq/L (ref 96–112)
Creatinine, Ser: 1.18 mg/dL (ref 0.40–1.50)
GFR: 58.07 mL/min — ABNORMAL LOW (ref >60.00)
Glucose, Bld: 125 mg/dL — ABNORMAL HIGH (ref 70–99)
Potassium: 4.3 meq/L (ref 3.5–5.1)
Sodium: 137 meq/L (ref 135–145)
Total Bilirubin: 0.6 mg/dL (ref 0.2–1.2)
Total Protein: 6.6 g/dL (ref 6.0–8.3)

## 2024-02-29 LAB — POCT GLYCOSYLATED HEMOGLOBIN (HGB A1C)
HbA1c POC (<> result, manual entry): 6.9 % (ref 4.0–5.6)
HbA1c, POC (controlled diabetic range): 6.9 % (ref 0.0–7.0)
HbA1c, POC (prediabetic range): 6.9 % — AB (ref 5.7–6.4)
Hemoglobin A1C: 6.9 % — AB (ref 4.0–5.6)

## 2024-02-29 LAB — MICROALBUMIN / CREATININE URINE RATIO
Creatinine,U: 76.9 mg/dL
Microalb Creat Ratio: 29.5 mg/g (ref 0.0–30.0)
Microalb, Ur: 2.3 mg/dL — ABNORMAL HIGH (ref 0.0–1.9)

## 2024-02-29 LAB — LIPID PANEL
Cholesterol: 138 mg/dL (ref 0–200)
HDL: 43.9 mg/dL (ref >39.00)
LDL Cholesterol: 59 mg/dL (ref 0–99)
NonHDL: 94.38
Total CHOL/HDL Ratio: 3
Triglycerides: 178 mg/dL — ABNORMAL HIGH (ref 0.0–149.0)
VLDL: 35.6 mg/dL (ref 0.0–40.0)

## 2024-02-29 LAB — POCT INR: INR: 1.8 — AB (ref 2.0–3.0)

## 2024-02-29 NOTE — Assessment & Plan Note (Signed)
 Chronic Controlled, Stable Continue fluoxetine 10 mg daily

## 2024-02-29 NOTE — Assessment & Plan Note (Signed)
 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

## 2024-02-29 NOTE — Assessment & Plan Note (Signed)
 Chronic Stable CBC, CMP Stressed good water intake Avoid NSAIDs Stressed good blood pressure and sugar control

## 2024-02-29 NOTE — Assessment & Plan Note (Signed)
 Chronic Has not been using CPAP nightly Stressed the importance of compliance and the potential side effects of not using CPAP He knows he needs to get back to using it nightly

## 2024-02-29 NOTE — Progress Notes (Addendum)
 Pt also has a PCP apt today. Increase dose today to take 1 1/2 tablets and then continue 1 tablet (1 mg) daily except take 1/2 tablet (0.5 mg) on Monday, Wednesday and Friday. Recheck in 4 weeks.

## 2024-02-29 NOTE — Assessment & Plan Note (Signed)
 Chronic Blood pressure well controlled CBC, CMP Continue losartan  100 mg daily, HCTZ 12.5 mg daily

## 2024-02-29 NOTE — Assessment & Plan Note (Addendum)
 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

## 2024-02-29 NOTE — Patient Instructions (Addendum)
 Pre visit review using our clinic review tool, if applicable. No additional management support is needed unless otherwise documented below in the visit note.  Increase dose today to take 1 1/2 tablets and then continue 1 tablet (1 mg) daily except take 1/2 tablet (0.5 mg) on Monday, Wednesday and Friday. Recheck in 4 weeks.

## 2024-02-29 NOTE — Assessment & Plan Note (Signed)
Chronic Following with cardiology On warfarin-managed by our Coumadin nurse Asymptomatic CBC, CMP

## 2024-02-29 NOTE — Assessment & Plan Note (Signed)
 Chronic Regular exercise and healthy diet encouraged CMP, lipid panel Continue pravastatin  20 mg daily

## 2024-03-01 ENCOUNTER — Ambulatory Visit: Payer: Self-pay | Admitting: Internal Medicine

## 2024-03-01 LAB — VITAMIN D 25 HYDROXY (VIT D DEFICIENCY, FRACTURES): VITD: 69.15 ng/mL (ref 30.00–100.00)

## 2024-03-28 ENCOUNTER — Ambulatory Visit

## 2024-03-31 ENCOUNTER — Ambulatory Visit

## 2024-03-31 DIAGNOSIS — Z7901 Long term (current) use of anticoagulants: Secondary | ICD-10-CM | POA: Diagnosis not present

## 2024-03-31 LAB — POCT INR: INR: 1.9 — AB (ref 2.0–3.0)

## 2024-03-31 NOTE — Patient Instructions (Addendum)
 Pre visit review using our clinic review tool, if applicable. No additional management support is needed unless otherwise documented below in the visit note.  Increased dose today to take 1 tablet and then change weekly dose to take 1 tablet (1 mg) daily except take 1/2 tablet (0.5 mg) on Monday and Friday. Recheck in 5 weeks per pt request.

## 2024-03-31 NOTE — Progress Notes (Signed)
 Increased dose today to take 1 tablet and then change weekly dose to take 1 tablet (1 mg) daily except take 1/2 tablet (0.5 mg) on Monday and Friday. Recheck in 5 weeks per pt request.

## 2024-05-05 ENCOUNTER — Ambulatory Visit

## 2024-05-05 DIAGNOSIS — Z7901 Long term (current) use of anticoagulants: Secondary | ICD-10-CM

## 2024-05-05 LAB — POCT INR: INR: 3.2 — AB (ref 2.0–3.0)

## 2024-05-05 NOTE — Patient Instructions (Addendum)
 Pre visit review using our clinic review tool, if applicable. No additional management support is needed unless otherwise documented below in the visit note.  Hold warfarin today and then continue 1 tablet (1 mg) daily except take 1/2 tablet (0.5 mg) on Monday and Friday. Recheck in 5 weeks per pt request.

## 2024-05-05 NOTE — Progress Notes (Signed)
 Indication: paroxysmal Afib Pt has been eating a lot of cherries. These interact with warfarin and cause an increase in INR. Pt denies any s/s of bleeding or abnormal bruising. Advised if any s/s to go to ER. Pt verbalized understanding. Hold warfarin today and then continue 1 tablet (1 mg) daily except take 1/2 tablet (0.5 mg) on Monday and Friday. Recheck in 5 weeks per pt request.

## 2024-05-07 ENCOUNTER — Other Ambulatory Visit: Payer: Self-pay | Admitting: Internal Medicine

## 2024-05-07 DIAGNOSIS — Z7901 Long term (current) use of anticoagulants: Secondary | ICD-10-CM

## 2024-05-16 ENCOUNTER — Ambulatory Visit

## 2024-05-16 DIAGNOSIS — I442 Atrioventricular block, complete: Secondary | ICD-10-CM

## 2024-05-17 LAB — CUP PACEART REMOTE DEVICE CHECK
Battery Remaining Longevity: 88 mo
Battery Remaining Percentage: 68 %
Battery Voltage: 3.01 V
Brady Statistic RV Percent Paced: 85 %
Date Time Interrogation Session: 20260120020014
Implantable Lead Connection Status: 753985
Implantable Lead Connection Status: 753985
Implantable Lead Implant Date: 20100216
Implantable Lead Implant Date: 20100216
Implantable Lead Location: 753859
Implantable Lead Location: 753860
Implantable Pulse Generator Implant Date: 20211014
Lead Channel Impedance Value: 440 Ohm
Lead Channel Pacing Threshold Amplitude: 1 V
Lead Channel Pacing Threshold Pulse Width: 0.5 ms
Lead Channel Sensing Intrinsic Amplitude: 5.7 mV
Lead Channel Setting Pacing Amplitude: 1.25 V
Lead Channel Setting Pacing Pulse Width: 0.5 ms
Lead Channel Setting Sensing Sensitivity: 2 mV
Pulse Gen Model: 2272
Pulse Gen Serial Number: 3873301

## 2024-05-18 ENCOUNTER — Ambulatory Visit: Payer: Self-pay | Admitting: Cardiology

## 2024-05-19 NOTE — Progress Notes (Signed)
 Remote PPM Transmission

## 2024-06-06 ENCOUNTER — Ambulatory Visit

## 2024-08-15 ENCOUNTER — Ambulatory Visit

## 2024-09-06 ENCOUNTER — Encounter: Admitting: Internal Medicine

## 2024-10-05 ENCOUNTER — Ambulatory Visit

## 2024-11-14 ENCOUNTER — Ambulatory Visit

## 2025-02-13 ENCOUNTER — Ambulatory Visit

## 2025-05-15 ENCOUNTER — Ambulatory Visit

## 2025-08-14 ENCOUNTER — Ambulatory Visit
